# Patient Record
Sex: Male | Born: 1950
Health system: Southern US, Community
[De-identification: ages and names within clinical notes are randomized; demographics above are authoritative.]

## PROBLEM LIST (undated history)

## (undated) ENCOUNTER — Emergency Department (HOSPITAL_COMMUNITY): Payer: Medicare Other | Source: Home / Self Care

## (undated) DIAGNOSIS — E042 Nontoxic multinodular goiter: Secondary | ICD-10-CM

## (undated) DIAGNOSIS — G4700 Insomnia, unspecified: Secondary | ICD-10-CM

## (undated) DIAGNOSIS — Z87442 Personal history of urinary calculi: Secondary | ICD-10-CM

## (undated) DIAGNOSIS — R413 Other amnesia: Secondary | ICD-10-CM

## (undated) DIAGNOSIS — T7840XA Allergy, unspecified, initial encounter: Secondary | ICD-10-CM

## (undated) DIAGNOSIS — C801 Malignant (primary) neoplasm, unspecified: Secondary | ICD-10-CM

## (undated) DIAGNOSIS — Z860100 Personal history of colon polyps, unspecified: Secondary | ICD-10-CM

## (undated) DIAGNOSIS — E039 Hypothyroidism, unspecified: Secondary | ICD-10-CM

## (undated) DIAGNOSIS — D649 Anemia, unspecified: Secondary | ICD-10-CM

## (undated) DIAGNOSIS — F32A Depression, unspecified: Secondary | ICD-10-CM

## (undated) DIAGNOSIS — F329 Major depressive disorder, single episode, unspecified: Secondary | ICD-10-CM

## (undated) DIAGNOSIS — F419 Anxiety disorder, unspecified: Secondary | ICD-10-CM

## (undated) HISTORY — DX: Anxiety disorder, unspecified: F41.9

## (undated) HISTORY — PX: TIBIA FRACTURE SURGERY: SHX806

## (undated) HISTORY — DX: Anemia, unspecified: D64.9

## (undated) HISTORY — DX: Insomnia, unspecified: G47.00

## (undated) HISTORY — DX: Major depressive disorder, single episode, unspecified: F32.9

## (undated) HISTORY — PX: COLONOSCOPY: SHX174

## (undated) HISTORY — DX: Other amnesia: R41.3

## (undated) HISTORY — DX: Allergy, unspecified, initial encounter: T78.40XA

## (undated) HISTORY — DX: Nontoxic multinodular goiter: E04.2

## (undated) HISTORY — DX: Malignant (primary) neoplasm, unspecified: C80.1

## (undated) HISTORY — DX: Depression, unspecified: F32.A

## (undated) HISTORY — PX: OTHER SURGICAL HISTORY: SHX169

## (undated) HISTORY — DX: Personal history of colon polyps, unspecified: Z86.0100

## (undated) HISTORY — PX: COLONOSCOPY: SHX5424

## (undated) HISTORY — DX: Personal history of colonic polyps: Z86.010

## (undated) HISTORY — DX: Hypothyroidism, unspecified: E03.9

## (undated) HISTORY — PX: THYROIDECTOMY, PARTIAL: SHX18

## (undated) HISTORY — PX: POLYPECTOMY: SHX149

---

## 2000-07-01 ENCOUNTER — Encounter: Admission: RE | Admit: 2000-07-01 | Discharge: 2000-07-01 | Payer: Self-pay | Admitting: Sports Medicine

## 2000-10-31 ENCOUNTER — Encounter: Admission: RE | Admit: 2000-10-31 | Discharge: 2000-10-31 | Payer: Self-pay | Admitting: Family Medicine

## 2000-11-16 ENCOUNTER — Ambulatory Visit (HOSPITAL_COMMUNITY): Admission: RE | Admit: 2000-11-16 | Discharge: 2000-11-16 | Payer: Self-pay | Admitting: Endocrinology

## 2000-11-16 ENCOUNTER — Encounter: Payer: Self-pay | Admitting: Endocrinology

## 2000-12-26 ENCOUNTER — Encounter (INDEPENDENT_AMBULATORY_CARE_PROVIDER_SITE_OTHER): Payer: Self-pay | Admitting: *Deleted

## 2000-12-26 ENCOUNTER — Ambulatory Visit (HOSPITAL_COMMUNITY): Admission: RE | Admit: 2000-12-26 | Discharge: 2000-12-26 | Payer: Self-pay | Admitting: Internal Medicine

## 2001-03-31 ENCOUNTER — Encounter: Admission: RE | Admit: 2001-03-31 | Discharge: 2001-03-31 | Payer: Self-pay | Admitting: Sports Medicine

## 2001-04-11 ENCOUNTER — Encounter: Payer: Self-pay | Admitting: Sports Medicine

## 2001-04-11 ENCOUNTER — Ambulatory Visit (HOSPITAL_COMMUNITY): Admission: RE | Admit: 2001-04-11 | Discharge: 2001-04-11 | Payer: Self-pay | Admitting: Sports Medicine

## 2001-04-11 ENCOUNTER — Encounter: Admission: RE | Admit: 2001-04-11 | Discharge: 2001-04-11 | Payer: Self-pay | Admitting: Family Medicine

## 2001-04-25 ENCOUNTER — Ambulatory Visit (HOSPITAL_COMMUNITY): Admission: RE | Admit: 2001-04-25 | Discharge: 2001-04-25 | Payer: Self-pay | Admitting: Sports Medicine

## 2001-04-25 ENCOUNTER — Encounter: Admission: RE | Admit: 2001-04-25 | Discharge: 2001-04-25 | Payer: Self-pay | Admitting: Sports Medicine

## 2001-04-25 ENCOUNTER — Encounter: Payer: Self-pay | Admitting: Sports Medicine

## 2001-05-12 ENCOUNTER — Ambulatory Visit (HOSPITAL_COMMUNITY): Admission: RE | Admit: 2001-05-12 | Discharge: 2001-05-12 | Payer: Self-pay | Admitting: Sports Medicine

## 2001-05-12 ENCOUNTER — Encounter: Payer: Self-pay | Admitting: Sports Medicine

## 2001-06-22 ENCOUNTER — Ambulatory Visit (HOSPITAL_COMMUNITY): Admission: RE | Admit: 2001-06-22 | Discharge: 2001-06-22 | Payer: Self-pay | Admitting: Sports Medicine

## 2001-06-22 ENCOUNTER — Encounter: Payer: Self-pay | Admitting: Sports Medicine

## 2001-06-30 ENCOUNTER — Encounter: Admission: RE | Admit: 2001-06-30 | Discharge: 2001-06-30 | Payer: Self-pay | Admitting: Sports Medicine

## 2004-03-06 ENCOUNTER — Ambulatory Visit: Payer: Self-pay | Admitting: Internal Medicine

## 2004-04-22 ENCOUNTER — Ambulatory Visit: Payer: Self-pay | Admitting: Internal Medicine

## 2004-06-26 ENCOUNTER — Ambulatory Visit: Payer: Self-pay | Admitting: Internal Medicine

## 2004-07-28 ENCOUNTER — Ambulatory Visit: Payer: Self-pay | Admitting: Internal Medicine

## 2005-01-15 ENCOUNTER — Ambulatory Visit: Payer: Self-pay | Admitting: Internal Medicine

## 2005-01-19 ENCOUNTER — Ambulatory Visit: Payer: Self-pay | Admitting: Internal Medicine

## 2005-02-19 ENCOUNTER — Ambulatory Visit: Payer: Self-pay | Admitting: Sports Medicine

## 2005-10-01 ENCOUNTER — Ambulatory Visit: Payer: Self-pay | Admitting: Family Medicine

## 2005-12-10 ENCOUNTER — Ambulatory Visit: Payer: Self-pay | Admitting: Internal Medicine

## 2006-02-07 ENCOUNTER — Ambulatory Visit: Payer: Self-pay | Admitting: Internal Medicine

## 2006-02-07 LAB — CONVERTED CEMR LAB
ALT: 25 units/L (ref 0–40)
AST: 42 units/L — ABNORMAL HIGH (ref 0–37)
Albumin: 3.9 g/dL (ref 3.5–5.2)
Alkaline Phosphatase: 58 units/L (ref 39–117)
Bilirubin Urine: NEGATIVE
Chloride: 104 meq/L (ref 96–112)
Glomerular Filtration Rate, Af Am: 99 mL/min/{1.73_m2}
Glucose, Bld: 102 mg/dL — ABNORMAL HIGH (ref 70–99)
Leukocytes, UA: NEGATIVE
Potassium: 4.1 meq/L (ref 3.5–5.1)
Sodium: 142 meq/L (ref 135–145)
Specific Gravity, Urine: 1.02 (ref 1.000–1.03)
Total Bilirubin: 0.9 mg/dL (ref 0.3–1.2)
Total Protein, Urine: NEGATIVE mg/dL
Total Protein: 7.4 g/dL (ref 6.0–8.3)
Triglyceride fasting, serum: 26 mg/dL (ref 0–149)
Urobilinogen, UA: 0.2 (ref 0.0–1.0)
pH: 6.5 (ref 5.0–8.0)

## 2006-02-08 ENCOUNTER — Ambulatory Visit: Payer: Self-pay | Admitting: Internal Medicine

## 2006-03-08 DIAGNOSIS — E039 Hypothyroidism, unspecified: Secondary | ICD-10-CM

## 2006-03-08 HISTORY — DX: Hypothyroidism, unspecified: E03.9

## 2006-05-03 ENCOUNTER — Ambulatory Visit: Payer: Self-pay | Admitting: Internal Medicine

## 2006-08-05 ENCOUNTER — Ambulatory Visit: Payer: Self-pay | Admitting: Internal Medicine

## 2006-08-05 LAB — CONVERTED CEMR LAB: Vit D, 1,25-Dihydroxy: 139 — ABNORMAL HIGH (ref 20–57)

## 2006-08-18 ENCOUNTER — Ambulatory Visit: Payer: Self-pay | Admitting: Professional

## 2006-08-29 ENCOUNTER — Ambulatory Visit: Payer: Self-pay | Admitting: Professional

## 2006-09-15 ENCOUNTER — Ambulatory Visit: Payer: Self-pay | Admitting: Professional

## 2006-09-26 ENCOUNTER — Ambulatory Visit: Payer: Self-pay | Admitting: Professional

## 2006-11-09 ENCOUNTER — Ambulatory Visit: Payer: Self-pay | Admitting: Internal Medicine

## 2006-12-02 ENCOUNTER — Ambulatory Visit: Payer: Self-pay | Admitting: Internal Medicine

## 2006-12-02 LAB — CONVERTED CEMR LAB
ALT: 37 units/L (ref 0–53)
AST: 49 units/L — ABNORMAL HIGH (ref 0–37)
Basophils Relative: 1.3 % — ABNORMAL HIGH (ref 0.0–1.0)
Bilirubin, Direct: 0.1 mg/dL (ref 0.0–0.3)
CO2: 33 meq/L — ABNORMAL HIGH (ref 19–32)
Calcium: 9.4 mg/dL (ref 8.4–10.5)
Chloride: 106 meq/L (ref 96–112)
Eosinophils Absolute: 0.1 10*3/uL (ref 0.0–0.6)
Eosinophils Relative: 4 % (ref 0.0–5.0)
GFR calc Af Amer: 112 mL/min
GFR calc non Af Amer: 93 mL/min
Glucose, Bld: 99 mg/dL (ref 70–99)
HCT: 37.2 % — ABNORMAL LOW (ref 39.0–52.0)
Hemoglobin: 13.2 g/dL (ref 13.0–17.0)
Lymphocytes Relative: 30.9 % (ref 12.0–46.0)
MCV: 97.7 fL (ref 78.0–100.0)
Neutro Abs: 1.9 10*3/uL (ref 1.4–7.7)
Neutrophils Relative %: 54 % (ref 43.0–77.0)
Platelets: 233 10*3/uL (ref 150–400)
Sodium: 142 meq/L (ref 135–145)
Total Protein: 7.1 g/dL (ref 6.0–8.3)
Vitamin B-12: 380 pg/mL (ref 211–911)
WBC: 3.3 10*3/uL — ABNORMAL LOW (ref 4.5–10.5)

## 2006-12-12 ENCOUNTER — Encounter: Payer: Self-pay | Admitting: Internal Medicine

## 2006-12-28 ENCOUNTER — Emergency Department (HOSPITAL_COMMUNITY): Admission: EM | Admit: 2006-12-28 | Discharge: 2006-12-28 | Payer: Self-pay | Admitting: Emergency Medicine

## 2007-02-09 ENCOUNTER — Telehealth (INDEPENDENT_AMBULATORY_CARE_PROVIDER_SITE_OTHER): Payer: Self-pay | Admitting: *Deleted

## 2007-02-28 ENCOUNTER — Ambulatory Visit: Payer: Self-pay | Admitting: Internal Medicine

## 2007-02-28 DIAGNOSIS — E038 Other specified hypothyroidism: Secondary | ICD-10-CM

## 2007-03-01 ENCOUNTER — Ambulatory Visit: Payer: Self-pay | Admitting: Internal Medicine

## 2007-03-01 DIAGNOSIS — F329 Major depressive disorder, single episode, unspecified: Secondary | ICD-10-CM

## 2007-03-01 DIAGNOSIS — F3289 Other specified depressive episodes: Secondary | ICD-10-CM | POA: Insufficient documentation

## 2007-03-01 DIAGNOSIS — E039 Hypothyroidism, unspecified: Secondary | ICD-10-CM | POA: Insufficient documentation

## 2007-03-01 DIAGNOSIS — F338 Other recurrent depressive disorders: Secondary | ICD-10-CM | POA: Insufficient documentation

## 2007-03-01 DIAGNOSIS — E041 Nontoxic single thyroid nodule: Secondary | ICD-10-CM | POA: Insufficient documentation

## 2007-03-16 ENCOUNTER — Encounter: Admission: RE | Admit: 2007-03-16 | Discharge: 2007-03-16 | Payer: Self-pay | Admitting: Internal Medicine

## 2007-03-20 ENCOUNTER — Encounter: Payer: Self-pay | Admitting: Internal Medicine

## 2007-03-21 ENCOUNTER — Telehealth: Payer: Self-pay | Admitting: Internal Medicine

## 2007-04-28 ENCOUNTER — Ambulatory Visit: Payer: Self-pay | Admitting: Endocrinology

## 2007-05-15 ENCOUNTER — Encounter: Payer: Self-pay | Admitting: Internal Medicine

## 2007-05-23 ENCOUNTER — Encounter (INDEPENDENT_AMBULATORY_CARE_PROVIDER_SITE_OTHER): Payer: Self-pay | Admitting: Diagnostic Radiology

## 2007-05-23 ENCOUNTER — Encounter: Admission: RE | Admit: 2007-05-23 | Discharge: 2007-05-23 | Payer: Self-pay | Admitting: Endocrinology

## 2007-05-23 ENCOUNTER — Encounter: Payer: Self-pay | Admitting: Endocrinology

## 2007-05-23 ENCOUNTER — Other Ambulatory Visit: Admission: RE | Admit: 2007-05-23 | Discharge: 2007-05-23 | Payer: Self-pay | Admitting: Diagnostic Radiology

## 2007-06-27 ENCOUNTER — Ambulatory Visit: Payer: Self-pay | Admitting: Internal Medicine

## 2007-06-27 DIAGNOSIS — N529 Male erectile dysfunction, unspecified: Secondary | ICD-10-CM | POA: Insufficient documentation

## 2007-06-27 DIAGNOSIS — Z860100 Personal history of colon polyps, unspecified: Secondary | ICD-10-CM | POA: Insufficient documentation

## 2007-06-27 DIAGNOSIS — R609 Edema, unspecified: Secondary | ICD-10-CM

## 2007-06-28 LAB — CONVERTED CEMR LAB
BUN: 18 mg/dL (ref 6–23)
CO2: 32 meq/L (ref 19–32)
Calcium: 9.5 mg/dL (ref 8.4–10.5)
Chloride: 99 meq/L (ref 96–112)
Creatinine, Ser: 0.8 mg/dL (ref 0.4–1.5)

## 2007-07-06 ENCOUNTER — Ambulatory Visit: Payer: Self-pay | Admitting: Internal Medicine

## 2007-07-20 ENCOUNTER — Ambulatory Visit: Payer: Self-pay | Admitting: Internal Medicine

## 2007-07-27 ENCOUNTER — Telehealth: Payer: Self-pay | Admitting: Internal Medicine

## 2007-11-14 ENCOUNTER — Telehealth (INDEPENDENT_AMBULATORY_CARE_PROVIDER_SITE_OTHER): Payer: Self-pay | Admitting: *Deleted

## 2007-12-20 ENCOUNTER — Ambulatory Visit: Payer: Self-pay | Admitting: Internal Medicine

## 2007-12-20 ENCOUNTER — Encounter: Admission: RE | Admit: 2007-12-20 | Discharge: 2007-12-20 | Payer: Self-pay | Admitting: Internal Medicine

## 2007-12-21 LAB — CONVERTED CEMR LAB
Basophils Absolute: 0.1 10*3/uL (ref 0.0–0.1)
Bilirubin, Direct: 0.1 mg/dL (ref 0.0–0.3)
Calcium: 9.4 mg/dL (ref 8.4–10.5)
Eosinophils Absolute: 0.2 10*3/uL (ref 0.0–0.7)
GFR calc Af Amer: 149 mL/min
GFR calc non Af Amer: 124 mL/min
Glucose, Bld: 92 mg/dL (ref 70–99)
HCT: 41.5 % (ref 39.0–52.0)
Hemoglobin: 14.5 g/dL (ref 13.0–17.0)
MCHC: 34.9 g/dL (ref 30.0–36.0)
MCV: 97.8 fL (ref 78.0–100.0)
Monocytes Absolute: 0.4 10*3/uL (ref 0.1–1.0)
Neutro Abs: 1.8 10*3/uL (ref 1.4–7.7)
Platelets: 194 10*3/uL (ref 150–400)
Potassium: 3.8 meq/L (ref 3.5–5.1)
RDW: 12.4 % (ref 11.5–14.6)
Sodium: 140 meq/L (ref 135–145)
TSH: 3.11 microintl units/mL (ref 0.35–5.50)
Total Bilirubin: 0.9 mg/dL (ref 0.3–1.2)
Total Protein: 7 g/dL (ref 6.0–8.3)

## 2008-01-16 ENCOUNTER — Ambulatory Visit: Payer: Self-pay | Admitting: Internal Medicine

## 2008-07-01 ENCOUNTER — Encounter (INDEPENDENT_AMBULATORY_CARE_PROVIDER_SITE_OTHER): Payer: Self-pay | Admitting: *Deleted

## 2008-07-16 ENCOUNTER — Ambulatory Visit: Payer: Self-pay | Admitting: Internal Medicine

## 2008-07-16 DIAGNOSIS — G4700 Insomnia, unspecified: Secondary | ICD-10-CM | POA: Insufficient documentation

## 2008-07-18 LAB — CONVERTED CEMR LAB
AST: 34 units/L (ref 0–37)
Albumin: 3.8 g/dL (ref 3.5–5.2)
Alkaline Phosphatase: 74 units/L (ref 39–117)
Basophils Relative: 2 % (ref 0.0–3.0)
CO2: 33 meq/L — ABNORMAL HIGH (ref 19–32)
Calcium: 9.6 mg/dL (ref 8.4–10.5)
GFR calc non Af Amer: 105.33 mL/min (ref >60)
HCT: 42.5 % (ref 39.0–52.0)
Hemoglobin: 14.9 g/dL (ref 13.0–17.0)
Lymphocytes Relative: 37.4 % (ref 12.0–46.0)
MCHC: 34.9 g/dL (ref 30.0–36.0)
Monocytes Relative: 8.7 % (ref 3.0–12.0)
Neutro Abs: 1.8 10*3/uL (ref 1.4–7.7)
Potassium: 4.2 meq/L (ref 3.5–5.1)
RBC: 4.32 M/uL (ref 4.22–5.81)
Sodium: 141 meq/L (ref 135–145)
Total Protein: 7.6 g/dL (ref 6.0–8.3)

## 2008-08-01 ENCOUNTER — Telehealth: Payer: Self-pay | Admitting: Internal Medicine

## 2009-01-10 ENCOUNTER — Telehealth: Payer: Self-pay | Admitting: Internal Medicine

## 2009-01-13 ENCOUNTER — Ambulatory Visit: Payer: Self-pay | Admitting: Internal Medicine

## 2009-01-14 LAB — CONVERTED CEMR LAB
AST: 29 units/L (ref 0–37)
Albumin: 3.6 g/dL (ref 3.5–5.2)
Alkaline Phosphatase: 73 units/L (ref 39–117)
BUN: 15 mg/dL (ref 6–23)
Basophils Absolute: 0.1 10*3/uL (ref 0.0–0.1)
Basophils Relative: 1.7 % (ref 0.0–3.0)
CO2: 30 meq/L (ref 19–32)
Chloride: 104 meq/L (ref 96–112)
Creatinine, Ser: 0.8 mg/dL (ref 0.4–1.5)
Glucose, Bld: 96 mg/dL (ref 70–99)
Hemoglobin, Urine: NEGATIVE
Hemoglobin: 12.5 g/dL — ABNORMAL LOW (ref 13.0–17.0)
LDL Cholesterol: 116 mg/dL — ABNORMAL HIGH (ref 0–99)
Lymphocytes Relative: 31 % (ref 12.0–46.0)
Monocytes Relative: 12.4 % — ABNORMAL HIGH (ref 3.0–12.0)
Neutro Abs: 1.5 10*3/uL (ref 1.4–7.7)
RBC: 3.72 M/uL — ABNORMAL LOW (ref 4.22–5.81)
RDW: 11.8 % (ref 11.5–14.6)
Total CHOL/HDL Ratio: 3
Total Protein, Urine: NEGATIVE mg/dL
Urine Glucose: NEGATIVE mg/dL
pH: 5 (ref 5.0–8.0)

## 2009-01-15 ENCOUNTER — Ambulatory Visit: Payer: Self-pay | Admitting: Internal Medicine

## 2009-01-16 ENCOUNTER — Telehealth (INDEPENDENT_AMBULATORY_CARE_PROVIDER_SITE_OTHER): Payer: Self-pay | Admitting: *Deleted

## 2009-02-04 ENCOUNTER — Ambulatory Visit: Payer: Self-pay | Admitting: Endocrinology

## 2009-02-20 ENCOUNTER — Encounter: Admission: RE | Admit: 2009-02-20 | Discharge: 2009-02-20 | Payer: Self-pay | Admitting: Endocrinology

## 2009-05-01 ENCOUNTER — Telehealth: Payer: Self-pay | Admitting: Internal Medicine

## 2009-06-04 ENCOUNTER — Telehealth (INDEPENDENT_AMBULATORY_CARE_PROVIDER_SITE_OTHER): Payer: Self-pay | Admitting: *Deleted

## 2009-07-16 ENCOUNTER — Ambulatory Visit: Payer: Self-pay | Admitting: Internal Medicine

## 2009-07-22 ENCOUNTER — Encounter (INDEPENDENT_AMBULATORY_CARE_PROVIDER_SITE_OTHER): Payer: Self-pay | Admitting: *Deleted

## 2009-07-24 ENCOUNTER — Ambulatory Visit: Payer: Self-pay | Admitting: Internal Medicine

## 2009-07-31 ENCOUNTER — Ambulatory Visit: Payer: Self-pay | Admitting: Internal Medicine

## 2009-07-31 LAB — HM COLONOSCOPY

## 2010-01-15 ENCOUNTER — Ambulatory Visit: Payer: Self-pay | Admitting: Internal Medicine

## 2010-01-15 LAB — CONVERTED CEMR LAB
Albumin: 3.7 g/dL (ref 3.5–5.2)
Basophils Absolute: 0 10*3/uL (ref 0.0–0.1)
Basophils Relative: 1.1 % (ref 0.0–3.0)
Bilirubin Urine: NEGATIVE
CO2: 29 meq/L (ref 19–32)
Chloride: 105 meq/L (ref 96–112)
Eosinophils Absolute: 0.1 10*3/uL (ref 0.0–0.7)
Glucose, Bld: 95 mg/dL (ref 70–99)
HCT: 40 % (ref 39.0–52.0)
HDL: 74.3 mg/dL (ref >39.00)
Hemoglobin, Urine: NEGATIVE
Hemoglobin: 13.7 g/dL (ref 13.0–17.0)
Lymphs Abs: 1 10*3/uL (ref 0.7–4.0)
MCHC: 34.2 g/dL (ref 30.0–36.0)
MCV: 96.2 fL (ref 78.0–100.0)
Neutro Abs: 2.1 10*3/uL (ref 1.4–7.7)
Nitrite: NEGATIVE
PSA: 0.52 ng/mL (ref 0.10–4.00)
Potassium: 4.9 meq/L (ref 3.5–5.1)
RDW: 13.8 % (ref 11.5–14.6)
Sodium: 144 meq/L (ref 135–145)
TSH: 2.48 microintl units/mL (ref 0.35–5.50)
Total Protein, Urine: NEGATIVE mg/dL
Total Protein: 6.7 g/dL (ref 6.0–8.3)
Triglycerides: 42 mg/dL (ref 0.0–149.0)
Urine Glucose: NEGATIVE mg/dL
pH: 7.5 (ref 5.0–8.0)

## 2010-01-21 ENCOUNTER — Ambulatory Visit: Payer: Self-pay | Admitting: Internal Medicine

## 2010-01-21 ENCOUNTER — Encounter: Payer: Self-pay | Admitting: Internal Medicine

## 2010-04-07 NOTE — Assessment & Plan Note (Signed)
Summary: CPX /NWS  #   Vital Signs:  Patient profile:   60 year old male Height:      70 inches Weight:      150 pounds BMI:     21.60 Temp:     97.5 degrees F oral Pulse rate:   72 / minute Pulse rhythm:   regular Resp:     16 per minute BP sitting:   110 / 70  (left arm) Cuff size:   regular  Vitals Entered By: Lanier Prude, Beverly Gust) (January 21, 2010 3:17 PM) CC: CPX Is Patient Diabetic? No Comments pt is not taking Fluticasone, Loratadine or Vit D   CC:  CPX.  History of Present Illness: The patient presents for a preventive health examination C/o urinary dribbling. Achy at times.  Current Medications (verified): 1)  Wellbutrin Xl 300 Mg  Tb24 (Bupropion Hcl) .... Take 1 Tablet By Mouth Once A Day 2)  Aricept 10 Mg  Tabs (Donepezil Hcl) .... Take 1 Tablet By Mouth Once A Day 3)  Fish Oil 1000 Mg  Caps (Omega-3 Fatty Acids) .... Take 1 Tablet By Mouth Once A Day 4)  Levothyroxine Sodium 25 Mcg  Tabs (Levothyroxine Sodium) .... Take 1 Tablet By Mouth Once A Day 5)  Cialis 20 Mg Tabs (Tadalafil) .Marland Kitchen.. 1 Tablet Every Other Day As Needed 6)  Fluticasone Propionate 50 Mcg/act  Susp (Fluticasone Propionate) .... 2 Sprays Each Nostril Once Daily 7)  Loratadine 10 Mg  Tabs (Loratadine) .... Once Daily As Needed Allergies 8)  Vitamin D 1000 Unit Tabs (Cholecalciferol) .Marland Kitchen.. 1 By Mouth Qd 9)  Triamcinolone Acetonide 0.5 % Oint (Triamcinolone Acetonide) .... Use Two Times A Day As Needed On Rash  Allergies (verified): 1)  ! 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[employer]  Social History: Occupation: lost his job 2011 unemployed now Single--divorced x many years Never Smoked Regular exercise-yes very active triathlete Don[employer]  Review of Systems  The patient denies anorexia, fever, weight loss, weight gain, vision loss, decreased hearing, hoarseness, chest pain, syncope, dyspnea on exertion, peripheral edema, prolonged cough, headaches, hemoptysis, abdominal pain, melena, hematochezia, severe indigestion/heartburn, hematuria, incontinence, genital sores, muscle weakness, suspicious skin lesions, transient blindness, difficulty walking, depression, unusual weight change, abnormal bleeding, enlarged lymph nodes, angioedema, and testicular masses.    Physical Exam  General:  Well-developed,well-nourished,in no acute distress; alert,appropriate and cooperative throughout examination Head:  Normocephalic and atraumatic without obvious abnormalities. No apparent alopecia or balding. Nose:  External nasal examination shows no deformity or inflammation. Nasal mucosa are pink and moist without lesions or exudates. Mouth:  Oral mucosa and oropharynx without lesions or exudates.  Teeth in good repair. Neck:  Nodules w/o change Lungs:  Normal respiratory effort, chest expands symmetrically. Lungs are clear to auscultation, no crackles or wheezes. Heart:  Normal rate and regular rhythm. S1 and S2 normal without gallop, murmur, click, rub or other extra sounds. Abdomen:  Bowel sounds positive,abdomen soft and non-tender without masses, organomegaly or hernias noted. Prostate:  Prostate gland firm and smooth, no enlargement, nodularity,  tenderness, mass, asymmetry or induration. Msk:  No deformity or scoliosis noted of thoracic or lumbar spine.   Neurologic:  No cranial nerve deficits noted. Station and gait are normal. Plantar reflexes are  down-going bilaterally. DTRs are symmetrical throughout. Sensory, motor and coordinative functions appear intact. Skin:  Intact without suspicious lesions or rashes Psych:  Oriented X3, memory intact for recent and remote, normally interactive, good eye contact, not anxious appearing, not agitated, and not suicidal.     Impression & Recommendations:  Problem # 1:  PHYSICAL EXAMINATION (ICD-V70.0) Assessment New Health and age related issues were discussed. Available screening tests and vaccinations were discussed as well. Healthy life style including good diet and exercise was discussed.   Orders: EKG w/ Interpretation (93000) WNL The labs were reviewed with the patient.   Problem # 2:  DEPRESSION (ICD-311) Assessment: Improved  His updated medication list for this problem includes:    Wellbutrin Xl 300 Mg Tb24 (Bupropion hcl) .Marland Kitchen... Take 1 tablet by mouth once a day  Problem # 3:  THYROID NODULE (ICD-241.0) Assessment: Unchanged  Problem # 4:  MEMORY LOSS (ICD-780.93) Assessment: Unchanged  Complete Medication List: 1)  Wellbutrin Xl 300 Mg Tb24 (Bupropion hcl) .... Take 1 tablet by mouth once a day 2)  Aricept 10 Mg Tabs (Donepezil hcl) .... Take 1 tablet by mouth once a day 3)  Fish Oil 1000 Mg Caps (Omega-3 fatty acids) .... Take 1 tablet by mouth once a day 4)  Levothyroxine Sodium 25 Mcg Tabs (Levothyroxine sodium) .... Take 1 tablet by mouth once a day 5)  Cialis 20 Mg Tabs (Tadalafil) .Marland Kitchen.. 1 tablet every other day as needed 6)  Fluticasone Propionate 50 Mcg/act Susp (Fluticasone propionate) .... 2 sprays each nostril once daily 7)  Loratadine 10 Mg Tabs (Loratadine) .... Once daily as needed allergies 8)  Vitamin D 1000 Unit Tabs (Cholecalciferol) .Marland Kitchen.. 1 by mouth qd 9)  Triamcinolone Acetonide 0.5 % Oint (Triamcinolone acetonide) .... Use two times a day as needed on rash  Patient Instructions: 1)  Please schedule a follow-up appointment in 6  months. Prescriptions: LORATADINE 10 MG  TABS (LORATADINE) once daily as needed allergies  #30 x 6   Entered and Authorized by:   Tresa Garter MD   Signed by:   Tresa Garter MD on 01/21/2010   Method used:   Print then Give to Patient   RxID:   [id number] FLUTICASONE PROPIONATE 50 MCG/ACT  SUSP (FLUTICASONE PROPIONATE) 2 sprays each nostril once daily  #1 vial x 3   Entered and Authorized by:   Tresa Garter MD   Signed by:   Tresa Garter MD on 01/21/2010   Method used:   Print then Give to Patient   RxID:   [id number] CIALIS 20 MG TABS (TADALAFIL) 1 tablet every other day as needed  #12 Tablet x 11   Entered and Authorized by:   Tresa Garter MD   Signed by:   Tresa Garter MD on 01/21/2010   Method used:   Print then Give to Patient   RxID:   [id number] LEVOTHYROXINE SODIUM 25 MCG  TABS (LEVOTHYROXINE SODIUM) Take 1 tablet by mouth once a day  #30 Tablet x 11   Entered and Authorized by:   Tresa Garter MD   Signed by:   Tresa Garter MD on 01/21/2010   Method used:   Print then Give to Patient   RxID:   [id number] ARICEPT 10  MG  TABS (DONEPEZIL HCL) Take 1 tablet by mouth once a day  #30 Tablet x 11   Entered and Authorized by:   Tresa Garter MD   Signed by:   Tresa Garter MD on 01/21/2010   Method used:   Print then Give to Patient   RxID:   [id number] WELLBUTRIN XL 300 MG  TB24 (BUPROPION HCL) Take 1 tablet by mouth once a day  #30 Tablet x 6   Entered and Authorized by:   Tresa Garter MD   Signed by:   Tresa Garter MD on 01/21/2010   Method used:   Print then Give to Patient   RxID:   [id number]    Orders Added: 1)  EKG w/ Interpretation [93000] 2)  Est. Patient age 72-64 [phone]

## 2010-04-07 NOTE — Progress Notes (Signed)
Summary: Schedule Colonoscopy  Phone Note Outgoing Call Call back at Home Phone [phone]   Call placed by: Harlow Mares CMA Duncan Dull),  May 01, 2009 3:09 PM Call placed to: Patient Summary of Call: Left message on patients machine to call back. Patient needs colonoscopy recall Initial call taken by: Harlow Mares CMA Duncan Dull),  May 01, 2009 3:09 PM  Follow-up for Phone Call        Left message on patients machine to call back.  Follow-up by: Harlow Mares CMA Duncan Dull),  May 15, 2009 11:08 AM

## 2010-04-07 NOTE — Progress Notes (Signed)
  Phone Note Outgoing Call   Summary of Call: Message left for pt. to call me back as he is overdue for colon and Dr.Perry would like extra prep of clear liquids 48 hrs. prior and 1 bottle mag. citrate the day prior in the am then Movie-prep Initial call taken by: Teryl Lucy RN,  June 04, 2009 3:25 PM

## 2010-04-07 NOTE — Letter (Signed)
Summary: [facility] Instructions  [facility]  [address] [place], [place] [address]   Phone: [phone]  Fax: [phone]       MUJTABA BOLLIG    November 08, 1960    MRN: [id number]        Procedure Day Dorna Bloom:  Lenor Coffin  07/31/09     Arrival Time:  9:00am     Procedure Time:  10:00am     Location of Procedure:                    _ X_  [facility] (4th Floor)                        PREPARATION FOR COLONOSCOPY WITH MOVIPREP   Starting 5 days prior to your procedure  SATURDAY 05/21 do not eat nuts, seeds, popcorn, corn, beans, peas,  salads, or any raw vegetables.  Do not take any fiber supplements (e.g. Metamucil, Citrucel, and Benefiber).  48 HOURS BEFORE YOUR PROCEDURE         DATE:  TUESDAY 05/24  1.  Drink clear liquids the entire day-NO SOLID FOOD  2.  Do not drink anything colored red or purple.  Avoid juices with pulp.  No orange juice.  3.  Drink at least 64 oz. (8 glasses) of fluid/clear liquids during the day to prevent dehydration and help the prep work efficiently.  CLEAR LIQUIDS INCLUDE: Water Jello Ice Popsicles Tea (sugar ok, no milk/cream) Powdered fruit flavored drinks Coffee (sugar ok, no milk/cream) Gatorade Juice: apple, white grape, white cranberry  Lemonade Clear bullion, consomm, broth Carbonated beverages (any kind) Strained chicken noodle soup Hard Candy                             4.  In the morning, mix first dose of MoviPrep solution as directed . (See directions below) Then drink 1 bottle of Mag Citrate in am and then full Movi prep as directed ( at 5pm) per Dr Marina Goodell!    Empty 1 Pouch A and 1 Pouch B into the disposable container    Add lukewarm drinking water to the top line of the container. Mix to dissolve    Refrigerate (mixed solution should be used within 24 hrs)  5.  Begin drinking the prep at 5:00 p.m. The MoviPrep container is divided by 4 marks.   Every 15 minutes drink the solution down to the  next mark (approximately 8 oz) until the full liter is complete.   6.  Follow completed prep with 16 oz of clear liquid of your choice (Nothing red or purple).  Continue to drink clear liquids until bedtime.  7.  Before going to bed, mix second dose of MoviPrep solution:    Empty 1 Pouch A and 1 Pouch B into the disposable container    Add lukewarm drinking water to the top line of the container. Mix to dissolve    Refrigerate  THE DAY OF YOUR PROCEDURE      DATE: THURSDAY  05/26  Beginning at  5:00 a.m. (5 hours before procedure):         1. Every 15 minutes, drink the solution down to the next mark (approx 8 oz) until the full liter is complete.  2. Follow completed prep with 16 oz. of clear liquid of your choice.    3. You may drink clear liquids until  8:00am (2  HOURS BEFORE PROCEDURE).   MEDICATION INSTRUCTIONS  Unless otherwise instructed, you should take regular prescription medications with a small sip of water   as early as possible the morning of your procedure.  Diabetic patients - see separate instructions.  Stop taking Plavix or Aggrenox on  _  _  (7 days before procedure).     Stop taking Coumadin on  _ _  (5 days before procedure).  Additional medication instructions: _         OTHER INSTRUCTIONS  You will need a responsible adult at least 61 years of age to accompany you and drive you home.   This person must remain in the waiting room during your procedure.  Wear loose fitting clothing that is easily removed.  Leave jewelry and other valuables at home.  However, you may wish to bring a book to read or  an iPod/MP3 player to listen to music as you wait for your procedure to start.  Remove all body piercing jewelry and leave at home.  Total time from sign-in until discharge is approximately 2-3 hours.  You should go home directly after your procedure and rest.  You can resume normal activities the  day after your procedure.  The day of your  procedure you should not:   Drive   Make legal decisions   Operate machinery   Drink alcohol   Return to work  You will receive specific instructions about eating, activities and medications before you leave.    The above instructions have been reviewed and explained to me by   _______________________    I fully understand and can verbalize these instructions _____________________________ Date _________

## 2010-04-07 NOTE — Miscellaneous (Signed)
Summary: [facility] previsit  Clinical Lists Changes  Medications: Added new medication of MOVIPREP 100 GM  SOLR (PEG-KCL-NACL-NASULF-NA ASC-C) As per prep instructions. - Signed Rx of MOVIPREP 100 GM  SOLR (PEG-KCL-NACL-NASULF-NA ASC-C) As per prep instructions.;  #1 x 0;  Signed;  Entered by: Karl Bales RN;  Authorized by: Hilarie Fredrickson MD;  Method used: Electronically to [facility]. #[id number]*, [address], [place], [place], [place]  [address], Ph: [phone], Fax: [phone]    Prescriptions: MOVIPREP 100 GM  SOLR (PEG-KCL-NACL-NASULF-NA ASC-C) As per prep instructions.  #1 x 0   Entered by:   Karl Bales RN   Authorized by:   Hilarie Fredrickson MD   Signed by:   Karl Bales RN on 07/24/2009   Method used:   Electronically to        [facility]. [phone]* (retail)       [address]       [place], [place]  [address]       Ph: [phone]       Fax: [phone]   RxID:   [id number]

## 2010-04-07 NOTE — Assessment & Plan Note (Signed)
Summary: 6 mos f/u #?cd   Vital Signs:  Patient profile:   60 year old male Height:      70 inches Weight:      154.25 pounds BMI:     22.21 O2 Sat:      98 % on Room air Temp:     98.3 degrees F oral Pulse rate:   59 / minute BP sitting:   112 / 80  (left arm) Cuff size:   regular  Vitals Entered By: Lucious Groves (Jul 16, 2009 3:20 PM)  O2 Flow:  Room air CC: 6 mo f/u./kb Is Patient Diabetic? No Pain Assessment Patient in pain? no        CC:  6 mo f/u./kb.  History of Present Illness: The patient presents for a follow up of hypothyroidism, depression, ED, memory loss.   Current Medications (verified): 1)  Wellbutrin Xl 300 Mg  Tb24 (Bupropion Hcl) .... Take 1 Tablet By Mouth Once A Day 2)  Aricept 10 Mg  Tabs (Donepezil Hcl) .... Take 1 Tablet By Mouth Once A Day 3)  Fish Oil 1000 Mg  Caps (Omega-3 Fatty Acids) .... Take 1 Tablet By Mouth Once A Day 4)  Levothyroxine Sodium 25 Mcg  Tabs (Levothyroxine Sodium) .... Take 1 Tablet By Mouth Once A Day 5)  Cialis 20 Mg Tabs (Tadalafil) .Marland Kitchen.. 1 Tablet Every Other Day As Needed 6)  Fluticasone Propionate 50 Mcg/act  Susp (Fluticasone Propionate) .... 2 Sprays Each Nostril Once Daily 7)  Loratadine 10 Mg  Tabs (Loratadine) .... Once Daily As Needed Allergies  Allergies (verified): 1)  ! Penicillin V Potassium (Penicillin V Potassium)  Past History:  Past Medical History: Last updated: 06/27/2007 Allergic to bee stings Anxiety Depression Insomnia Hypothyroidism 2008 Memory problems Thyroid nodule 2009 Colonic polyps, hx of Dr Marina Goodell  Family History: Last updated: 04/28/2007 F CAD 70's no thyroid dz  Past Surgical History: Denies surgical history  Social History: Occupation: lost his job 2011 Single--divorced x many years Never Smoked Regular exercise-yes very active triathlete [employer]  Review of Systems  The patient denies fever, chest pain, prolonged cough, abdominal pain, melena, and hematochezia.     Physical Exam  General:  Well-developed,well-nourished,in no acute distress; alert,appropriate and cooperative throughout examination Ears:  External ear exam shows no significant lesions or deformities.  Otoscopic examination reveals clear canals, tympanic membranes are intact bilaterally without bulging, retraction, inflammation or discharge. Hearing is grossly normal bilaterally. Nose:  External nasal examination shows no deformity or inflammation. Nasal mucosa are pink and moist without lesions or exudates. Mouth:  Oral mucosa and oropharynx without lesions or exudates.  Teeth in good repair. Neck:  Nodules w/o change Lungs:  Normal respiratory effort, chest expands symmetrically. Lungs are clear to auscultation, no crackles or wheezes. Heart:  Normal rate and regular rhythm. S1 and S2 normal without gallop, murmur, click, rub or other extra sounds. Abdomen:  Bowel sounds positive,abdomen soft and non-tender without masses, organomegaly or hernias noted. Msk:  No deformity or scoliosis noted of thoracic or lumbar spine.   Pulses:  R and L carotid,radial,femoral,dorsalis pedis and posterior tibial pulses are full and equal bilaterally Extremities:  No clubbing, cyanosis, edema, or deformity noted with normal full range of motion of all joints.   Neurologic:  No cranial nerve deficits noted. Station and gait are normal. Plantar reflexes are down-going bilaterally. DTRs are symmetrical throughout. Sensory, motor and coordinative functions appear intact. Skin:  Intact without suspicious lesions or rashes Psych:  Oriented X3, memory intact for recent and remote, normally interactive, good eye contact, not anxious appearing, not agitated, and not suicidal.     Impression & Recommendations:  Problem # 1:  HYPOTHYROIDISM (ICD-244.9) Assessment Unchanged  His updated medication list for this problem includes:    Levothyroxine Sodium 25 Mcg Tabs (Levothyroxine sodium) .Marland Kitchen... Take 1 tablet by  mouth once a day  Problem # 2:  MEMORY LOSS (ICD-780.93) Assessment: Unchanged On prescription drug  therapy   Problem # 3:  DEPRESSION (ICD-311) Assessment: Unchanged  His updated medication list for this problem includes:    Wellbutrin Xl 300 Mg Tb24 (Bupropion hcl) .Marland Kitchen... Take 1 tablet by mouth once a day  Problem # 4:  ANXIETY (ICD-300.00) Assessment: Unchanged  His updated medication list for this problem includes:    Wellbutrin Xl 300 Mg Tb24 (Bupropion hcl) .Marland Kitchen... Take 1 tablet by mouth once a day  Complete Medication List: 1)  Wellbutrin Xl 300 Mg Tb24 (Bupropion hcl) .... Take 1 tablet by mouth once a day 2)  Aricept 10 Mg Tabs (Donepezil hcl) .... Take 1 tablet by mouth once a day 3)  Fish Oil 1000 Mg Caps (Omega-3 fatty acids) .... Take 1 tablet by mouth once a day 4)  Levothyroxine Sodium 25 Mcg Tabs (Levothyroxine sodium) .... Take 1 tablet by mouth once a day 5)  Cialis 20 Mg Tabs (Tadalafil) .Marland Kitchen.. 1 tablet every other day as needed 6)  Fluticasone Propionate 50 Mcg/act Susp (Fluticasone propionate) .... 2 sprays each nostril once daily 7)  Loratadine 10 Mg Tabs (Loratadine) .... Once daily as needed allergies 8)  Vitamin D 1000 Unit Tabs (Cholecalciferol) .Marland Kitchen.. 1 by mouth qd 9)  Triamcinolone Acetonide 0.5 % Oint (Triamcinolone acetonide) .... Use two times a day as needed on rash  Patient Instructions: 1)  Please schedule a follow-up appointment in 6 months well w/labs. 2)  Can try [place] Prescriptions: CIALIS 20 MG TABS (TADALAFIL) 1 tablet every other day as needed  #12 Tablet x 11   Entered and Authorized by:   Tresa Garter MD   Signed by:   Tresa Garter MD on 07/16/2009   Method used:   Print then Give to Patient   RxID:   [id number] TRIAMCINOLONE ACETONIDE 0.5 % OINT (TRIAMCINOLONE ACETONIDE) use two times a day as needed on rash  #45 g x 3   Entered and Authorized by:   Tresa Garter MD   Signed by:   Tresa Garter MD on  07/16/2009   Method used:   Print then Give to Patient   RxID:   [id number]

## 2010-04-07 NOTE — Procedures (Signed)
Summary: Colonoscopy  Patient: Travis Morse Note: All result statuses are Final unless otherwise noted.  Tests: (1) Colonoscopy (COL)   COL Colonoscopy           DONE     [facility]     [address]     [place], [place]  [address]           COLONOSCOPY PROCEDURE REPORT           PATIENT:  Travis Morse, Travis Morse  MR#:  [id number]     BIRTHDATE:  Jul 31, 1950, 58 yrs. old  GENDER:  male     ENDOSCOPIST:  Wilhemina Bonito. Eda Keys, MD     REF. BY:  Surveillance Program Recall,     PROCEDURE DATE:  07/31/2009     PROCEDURE:  Surveillance Colonoscopy     ASA CLASS:  Class II     INDICATIONS:  history of pre-cancerous (adenomatous) colon polyps     ; index 12-2000 w/ small adenoma; f/u 5-09 w/ poor prep     MEDICATIONS:   Fentanyl 100 mcg IV, Versed 10 mg IV, Benadryl 50     mg IV           DESCRIPTION OF PROCEDURE:   After the risks benefits and     alternatives of the procedure were thoroughly explained, informed     consent was obtained.  Digital rectal exam was performed and     revealed no abnormalities.   The LB [id number] endoscope     was introduced through the anus and advanced to the cecum, which     was identified by both the appendix and ileocecal valve, without     limitations.  The quality of the prep was fair despite 2 days of     clears, Mag citrate and  Moviprep. However, w/ vigorous irrigation     and time it was upgraded to good.Time to cecum = 6:25 min.  The     instrument was then slowly withdrawn (time = 16:47 min.) as the     colon was fully examined.     <<PROCEDUREIMAGES>>           FINDINGS:  A normal appearing cecum, ileocecal valve, and     appendiceal orifice were identified. The ascending, hepatic     flexure, transverse, splenic flexure, descending, sigmoid colon,     and rectum appeared unremarkable.  No polyps or cancers were seen.     Retroflexed views in the rectum revealed internal hemorrhoids.     The scope was then withdrawn from the  patient and the procedure     completed.           COMPLICATIONS:  None     ENDOSCOPIC IMPRESSION:     1) Normal colon     2) No polyps or cancers     3) Internal hemorrhoids     RECOMMENDATIONS:     1) Follow up colonoscopy in 5 years (again w/ extended prep)           ______________________________     Wilhemina Bonito. Eda Keys, MD           CC:  Linda Hedges. Plotnikov, MD;The Patient           n.     eSIGNED:   John N. Eda Keys at 07/31/2009 12:05 PM           Roderic Ovens, [id number]  Note: An exclamation mark (!) indicates a  result that was not dispersed into the flowsheet. Document Creation Date: 07/31/2009 12:06 PM _______________________________________________________________________  (1) Order result status: Final Collection or observation date-time: 07/31/2009 11:52 Requested date-time:  Receipt date-time:  Reported date-time:  Referring Physician:   Ordering Physician: Fransico Setters [phone]) Specimen Source:  Source: Launa Grill Order Number: [id number] Lab site:   Appended Document: Colonoscopy    Clinical Lists Changes  Observations: Added new observation of COLONNXTDUE: 07/2014 (07/31/2009 13:33)

## 2010-07-08 ENCOUNTER — Other Ambulatory Visit: Payer: Self-pay | Admitting: Internal Medicine

## 2010-07-21 ENCOUNTER — Encounter: Payer: Self-pay | Admitting: Internal Medicine

## 2010-07-23 ENCOUNTER — Ambulatory Visit (INDEPENDENT_AMBULATORY_CARE_PROVIDER_SITE_OTHER): Payer: BC Managed Care – PPO | Admitting: Internal Medicine

## 2010-07-23 ENCOUNTER — Encounter: Payer: Self-pay | Admitting: Internal Medicine

## 2010-07-23 ENCOUNTER — Other Ambulatory Visit (INDEPENDENT_AMBULATORY_CARE_PROVIDER_SITE_OTHER): Payer: BC Managed Care – PPO

## 2010-07-23 ENCOUNTER — Telehealth: Payer: Self-pay | Admitting: Internal Medicine

## 2010-07-23 DIAGNOSIS — F329 Major depressive disorder, single episode, unspecified: Secondary | ICD-10-CM

## 2010-07-23 DIAGNOSIS — R413 Other amnesia: Secondary | ICD-10-CM

## 2010-07-23 DIAGNOSIS — F3289 Other specified depressive episodes: Secondary | ICD-10-CM

## 2010-07-23 DIAGNOSIS — N529 Male erectile dysfunction, unspecified: Secondary | ICD-10-CM

## 2010-07-23 LAB — COMPREHENSIVE METABOLIC PANEL
ALT: 23 U/L (ref 0–53)
AST: 35 U/L (ref 0–37)
Alkaline Phosphatase: 74 U/L (ref 39–117)
BUN: 13 mg/dL (ref 6–23)
Calcium: 9.6 mg/dL (ref 8.4–10.5)
Chloride: 104 mEq/L (ref 96–112)
Creatinine, Ser: 0.7 mg/dL (ref 0.4–1.5)

## 2010-07-23 LAB — TESTOSTERONE: Testosterone: 399.81 ng/dL (ref 350.00–890.00)

## 2010-07-23 LAB — TSH: TSH: 3.03 u[IU]/mL (ref 0.35–5.50)

## 2010-07-23 MED ORDER — TADALAFIL 5 MG PO TABS: 5.0000 mg | ORAL_TABLET | Freq: Every day | ORAL | Status: DC | PRN

## 2010-07-23 NOTE — Assessment & Plan Note (Signed)
We will start Cialis daily

## 2010-07-23 NOTE — Assessment & Plan Note (Signed)
On Rx 

## 2010-07-23 NOTE — Progress Notes (Signed)
  Subjective:    Patient ID: Travis Morse, male    DOB: 09-07-1950, 60 y.o.   MRN: [id number]  HPI   F/u memory issues, depression, anxiety, ED, hypothyroidism.  Review of Systems  Constitutional: Negative for chills.  HENT: Negative for congestion.   Respiratory: Negative for cough.   Genitourinary: Negative for testicular pain.  Musculoskeletal: Negative for back pain.  Neurological: Negative for syncope.  Psychiatric/Behavioral: Negative for confusion.       Objective:   Physical Exam  Constitutional: He is oriented to person, place, and time. He appears well-developed.  HENT:  Mouth/Throat: Oropharynx is clear and moist.  Eyes: Conjunctivae are normal. Pupils are equal, round, and reactive to light.  Neck: Normal range of motion. No JVD present. No thyromegaly present.  Cardiovascular: Normal rate, regular rhythm, normal heart sounds and intact distal pulses.  Exam reveals no gallop and no friction rub.   No murmur heard. Pulmonary/Chest: Effort normal and breath sounds normal. No respiratory distress. He has no wheezes. He has no rales. He exhibits no tenderness.  Abdominal: Soft. Bowel sounds are normal. He exhibits no distension and no mass. There is no tenderness. There is no rebound and no guarding.  Musculoskeletal: Normal range of motion. He exhibits no edema and no tenderness.  Lymphadenopathy:    He has no cervical adenopathy.  Neurological: He is alert and oriented to person, place, and time. He has normal reflexes. No cranial nerve deficit. He exhibits normal muscle tone. Coordination normal.  Skin: Skin is warm and dry. No rash noted.  Psychiatric: He has a normal mood and affect. His behavior is normal. Judgment and thought content normal.          Assessment & Plan:  DEPRESSION On Rx  ERECTILE DYSFUNCTION We will start Cialis daily  MEMORY LOSS On Rx

## 2010-07-23 NOTE — Telephone Encounter (Signed)
Stacey, please, inform patient that all labs are normal Thx 

## 2010-07-24 NOTE — Telephone Encounter (Signed)
Left detailed mess informing pt of below.  

## 2010-08-09 ENCOUNTER — Other Ambulatory Visit: Payer: Self-pay | Admitting: Internal Medicine

## 2010-11-08 ENCOUNTER — Other Ambulatory Visit: Payer: Self-pay | Admitting: Internal Medicine

## 2010-12-16 LAB — URINALYSIS, ROUTINE W REFLEX MICROSCOPIC
Bilirubin Urine: NEGATIVE
Glucose, UA: NEGATIVE
Specific Gravity, Urine: 1.027
Urobilinogen, UA: 0.2
pH: 7

## 2010-12-16 LAB — DIFFERENTIAL
Basophils Absolute: 0
Basophils Relative: 1
Monocytes Absolute: 0.3
Neutro Abs: 2.3

## 2010-12-16 LAB — POCT I-STAT CREATININE
Creatinine, Ser: 1.1
Operator id: 270651

## 2010-12-16 LAB — I-STAT 8, (EC8 V) (CONVERTED LAB)
Acid-Base Excess: 6 — ABNORMAL HIGH
Glucose, Bld: 108 — ABNORMAL HIGH
Potassium: 4.2
TCO2: 35
pCO2, Ven: 57 — ABNORMAL HIGH
pH, Ven: 7.374 — ABNORMAL HIGH

## 2010-12-16 LAB — CBC
Hemoglobin: 14
MCHC: 34.2
Platelets: 227
RDW: 13.6

## 2010-12-16 LAB — URINE MICROSCOPIC-ADD ON

## 2010-12-23 ENCOUNTER — Telehealth: Payer: Self-pay | Admitting: *Deleted

## 2010-12-23 DIAGNOSIS — Z0000 Encounter for general adult medical examination without abnormal findings: Secondary | ICD-10-CM

## 2010-12-23 DIAGNOSIS — Z0389 Encounter for observation for other suspected diseases and conditions ruled out: Secondary | ICD-10-CM

## 2010-12-23 NOTE — Telephone Encounter (Signed)
Message copied by Merrilyn Puma on Wed Dec 23, 2010  8:47 AM ------      Message from: Earl Lagos      Created: Fri Dec 18, 2010  1:26 PM      Regarding: labs       Please schedule labs for cpx 01/26/11. Thanks

## 2010-12-23 NOTE — Telephone Encounter (Signed)
Labs entered.

## 2011-01-20 ENCOUNTER — Telehealth: Payer: Self-pay | Admitting: *Deleted

## 2011-01-20 DIAGNOSIS — Z202 Contact with and (suspected) exposure to infections with a predominantly sexual mode of transmission: Secondary | ICD-10-CM

## 2011-01-20 NOTE — Telephone Encounter (Signed)
Pt states that he called concerning labs that he is having prior to his OV on Tuesday, 11.20.12 [which have been placed in Epic], wanting to add another lab to orders for Herpes as he is presently in a relationship w/a partner that is positive for Genital Herpes and wanted to check his status.  Patient was told by scheduling that "he had to have appointment prior to having the new lab requested", he was wanting to avoid "being stuck twice" [two lab visits].  Can an order be placed for Herpes lab to be done with other labs prior to 11.20.12 OV. Please advise.

## 2011-01-21 ENCOUNTER — Other Ambulatory Visit (INDEPENDENT_AMBULATORY_CARE_PROVIDER_SITE_OTHER): Payer: BC Managed Care – PPO

## 2011-01-21 ENCOUNTER — Other Ambulatory Visit: Payer: Self-pay | Admitting: Internal Medicine

## 2011-01-21 ENCOUNTER — Other Ambulatory Visit: Payer: BC Managed Care – PPO

## 2011-01-21 DIAGNOSIS — Z0000 Encounter for general adult medical examination without abnormal findings: Secondary | ICD-10-CM

## 2011-01-21 DIAGNOSIS — Z0389 Encounter for observation for other suspected diseases and conditions ruled out: Secondary | ICD-10-CM

## 2011-01-21 LAB — CBC WITH DIFFERENTIAL/PLATELET
Basophils Absolute: 0.1 10*3/uL (ref 0.0–0.1)
Eosinophils Absolute: 0.1 10*3/uL (ref 0.0–0.7)
HCT: 39.7 % (ref 39.0–52.0)
Hemoglobin: 13.3 g/dL (ref 13.0–17.0)
Lymphs Abs: 1.3 10*3/uL (ref 0.7–4.0)
MCHC: 33.5 g/dL (ref 30.0–36.0)
Neutro Abs: 2.4 10*3/uL (ref 1.4–7.7)
RDW: 15.4 % — ABNORMAL HIGH (ref 11.5–14.6)

## 2011-01-21 LAB — URINALYSIS, ROUTINE W REFLEX MICROSCOPIC
Bilirubin Urine: NEGATIVE
Ketones, ur: NEGATIVE
Leukocytes, UA: NEGATIVE
Specific Gravity, Urine: 1.02 (ref 1.000–1.030)
Urobilinogen, UA: 0.2 (ref 0.0–1.0)

## 2011-01-21 NOTE — Telephone Encounter (Signed)
OK to add Herpes simplex type 1 and 2 serology Thx

## 2011-01-22 LAB — HEPATIC FUNCTION PANEL
ALT: 32 U/L (ref 0–53)
AST: 46 U/L — ABNORMAL HIGH (ref 0–37)
Albumin: 3.8 g/dL (ref 3.5–5.2)
Total Bilirubin: 0.5 mg/dL (ref 0.3–1.2)

## 2011-01-22 LAB — BASIC METABOLIC PANEL
BUN: 17 mg/dL (ref 6–23)
Chloride: 105 mEq/L (ref 96–112)
Glucose, Bld: 90 mg/dL (ref 70–99)
Potassium: 4.8 mEq/L (ref 3.5–5.1)

## 2011-01-22 LAB — PSA: PSA: 0.52 ng/mL (ref 0.10–4.00)

## 2011-01-26 ENCOUNTER — Encounter: Payer: Self-pay | Admitting: Internal Medicine

## 2011-01-26 ENCOUNTER — Ambulatory Visit (INDEPENDENT_AMBULATORY_CARE_PROVIDER_SITE_OTHER): Payer: BC Managed Care – PPO | Admitting: Internal Medicine

## 2011-01-26 VITALS — BP 118/80 | HR 72 | Temp 97.6°F | Resp 16 | Ht 70.0 in | Wt 154.0 lb

## 2011-01-26 DIAGNOSIS — Z23 Encounter for immunization: Secondary | ICD-10-CM

## 2011-01-26 DIAGNOSIS — E039 Hypothyroidism, unspecified: Secondary | ICD-10-CM

## 2011-01-26 DIAGNOSIS — R413 Other amnesia: Secondary | ICD-10-CM

## 2011-01-26 DIAGNOSIS — Z0000 Encounter for general adult medical examination without abnormal findings: Secondary | ICD-10-CM | POA: Insufficient documentation

## 2011-01-26 DIAGNOSIS — Z136 Encounter for screening for cardiovascular disorders: Secondary | ICD-10-CM

## 2011-01-26 DIAGNOSIS — N529 Male erectile dysfunction, unspecified: Secondary | ICD-10-CM

## 2011-01-26 NOTE — Telephone Encounter (Signed)
Order  Done

## 2011-01-26 NOTE — Progress Notes (Signed)
  Subjective:    Patient ID: Travis Morse, male    DOB: 1950-05-03, 60 y.o.   MRN: [id number]  HPI  The patient is here for a wellness exam. The patient has been doing well overall without major physical or psychological issues going on lately.   Review of Systems  Constitutional: Negative for appetite change, fatigue and unexpected weight change.  HENT: Negative for nosebleeds, congestion, sore throat, sneezing, trouble swallowing and neck pain.   Eyes: Negative for itching and visual disturbance.  Respiratory: Negative for cough.   Cardiovascular: Negative for chest pain, palpitations and leg swelling.  Gastrointestinal: Negative for nausea, diarrhea, blood in stool and abdominal distention.  Genitourinary: Negative for frequency and hematuria.  Musculoskeletal: Negative for back pain, joint swelling and gait problem.  Skin: Negative for rash.  Neurological: Negative for dizziness, tremors, speech difficulty and weakness.  Psychiatric/Behavioral: Negative for sleep disturbance, dysphoric mood and agitation. The patient is not nervous/anxious.        Objective:   Physical Exam  Constitutional: He is oriented to person, place, and time. He appears well-developed and well-nourished. No distress.  HENT:  Head: Normocephalic and atraumatic.  Right Ear: External ear normal.  Left Ear: External ear normal.  Nose: Nose normal.  Mouth/Throat: Oropharynx is clear and moist. No oropharyngeal exudate.  Eyes: Conjunctivae and EOM are normal. Pupils are equal, round, and reactive to light. Right eye exhibits no discharge. Left eye exhibits no discharge. No scleral icterus.  Neck: Normal range of motion. Neck supple. No JVD present. No tracheal deviation present. No thyromegaly present.  Cardiovascular: Normal rate, regular rhythm, normal heart sounds and intact distal pulses.  Exam reveals no gallop and no friction rub.   No murmur heard. Pulmonary/Chest: Effort normal and breath sounds  normal. No stridor. No respiratory distress. He has no wheezes. He has no rales. He exhibits no tenderness.  Abdominal: Soft. Bowel sounds are normal. He exhibits no distension and no mass. There is no tenderness. There is no rebound and no guarding.  Genitourinary: Rectum normal, prostate normal and penis normal. Guaiac negative stool. No penile tenderness.  Musculoskeletal: Normal range of motion. He exhibits no edema and no tenderness.  Lymphadenopathy:    He has no cervical adenopathy.  Neurological: He is alert and oriented to person, place, and time. He has normal reflexes. No cranial nerve deficit. He exhibits normal muscle tone. Coordination normal.  Skin: Skin is warm and dry. No rash noted. He is not diaphoretic. No erythema. No pallor.  Psychiatric: He has a normal mood and affect. His behavior is normal. Judgment and thought content normal.     Lab Results  Component Value Date   WBC 4.3* 01/21/2011   HGB 13.3 01/21/2011   HCT 39.7 01/21/2011   PLT 239.0 01/21/2011   GLUCOSE 90 01/21/2011   CHOL 204* 01/21/2011   TRIG 33.0 01/21/2011   HDL 82.50 01/21/2011   LDLDIRECT 117.3 01/21/2011   LDLCALC 116* 01/13/2009   ALT 32 01/21/2011   AST 46* 01/21/2011   NA 142 01/21/2011   K 4.8 01/21/2011   CL 105 01/21/2011   CREATININE 0.8 01/21/2011   BUN 17 01/21/2011   CO2 29 01/21/2011   TSH 2.70 01/21/2011   PSA 0.52 01/21/2011        Assessment & Plan:

## 2011-01-26 NOTE — Assessment & Plan Note (Signed)
We discussed age appropriate health related issues, including available/recomended screening tests and vaccinations. We discussed a need for adhering to healthy diet and exercise. Labs/EKG were reviewed/ordered. All questions were answered.   

## 2011-01-26 NOTE — Assessment & Plan Note (Signed)
Continue with current prescription therapy as reflected on the Med list.  

## 2011-01-26 NOTE — Assessment & Plan Note (Signed)
Chronic, mild 

## 2011-01-26 NOTE — Assessment & Plan Note (Signed)
Use Cialis

## 2011-01-31 ENCOUNTER — Encounter: Payer: Self-pay | Admitting: Internal Medicine

## 2011-02-08 ENCOUNTER — Other Ambulatory Visit: Payer: Self-pay | Admitting: *Deleted

## 2011-02-08 MED ORDER — DONEPEZIL HCL 10 MG PO TABS: 10.0000 mg | ORAL_TABLET | Freq: Every day | ORAL | Status: DC

## 2011-05-07 ENCOUNTER — Other Ambulatory Visit: Payer: Self-pay | Admitting: Internal Medicine

## 2011-07-26 ENCOUNTER — Ambulatory Visit (INDEPENDENT_AMBULATORY_CARE_PROVIDER_SITE_OTHER): Payer: BC Managed Care – PPO | Admitting: Internal Medicine

## 2011-07-26 ENCOUNTER — Encounter: Payer: Self-pay | Admitting: Internal Medicine

## 2011-07-26 ENCOUNTER — Other Ambulatory Visit (INDEPENDENT_AMBULATORY_CARE_PROVIDER_SITE_OTHER): Payer: BC Managed Care – PPO

## 2011-07-26 VITALS — BP 120/80 | HR 68 | Temp 97.8°F | Resp 16 | Wt 154.0 lb

## 2011-07-26 DIAGNOSIS — R209 Unspecified disturbances of skin sensation: Secondary | ICD-10-CM

## 2011-07-26 DIAGNOSIS — E039 Hypothyroidism, unspecified: Secondary | ICD-10-CM

## 2011-07-26 DIAGNOSIS — R413 Other amnesia: Secondary | ICD-10-CM

## 2011-07-26 DIAGNOSIS — R202 Paresthesia of skin: Secondary | ICD-10-CM

## 2011-07-26 DIAGNOSIS — F329 Major depressive disorder, single episode, unspecified: Secondary | ICD-10-CM

## 2011-07-26 DIAGNOSIS — M2550 Pain in unspecified joint: Secondary | ICD-10-CM

## 2011-07-26 LAB — BASIC METABOLIC PANEL
BUN: 12 mg/dL (ref 6–23)
CO2: 29 mEq/L (ref 19–32)
GFR: 114.08 mL/min (ref >60.00)
Glucose, Bld: 98 mg/dL (ref 70–99)
Potassium: 4.7 mEq/L (ref 3.5–5.1)

## 2011-07-26 NOTE — Assessment & Plan Note (Signed)
Resolved

## 2011-07-26 NOTE — Assessment & Plan Note (Signed)
Better Continue with current prescription therapy as reflected on the Med list.  

## 2011-07-26 NOTE — Progress Notes (Signed)
Patient ID: Travis Morse, male   DOB: 1950-05-07, 61 y.o.   MRN: [id number]  Subjective:    Patient ID: Travis Morse, male    DOB: 11-21-1950, 62 y.o.   MRN: [id number]  Knee Pain     The patient is here to follow up on chronic depression, anxiety, hypothyroidism and a new chronic mild to moderate achyness and some stiffness symptoms x 6-10 mo. No joint swelling. Knees are worse than before.Marland KitchenMarland KitchenOne advil a day usually would eliminate all sx's.  Wt Readings from Last 3 Encounters:  07/26/11 154 lb (69.854 kg)  01/26/11 154 lb (69.854 kg)  07/23/10 154 lb (69.854 kg)   BP Readings from Last 3 Encounters:  07/26/11 120/80  01/26/11 118/80  07/23/10 100/78         Review of Systems  Constitutional: Negative for appetite change, fatigue and unexpected weight change.  HENT: Negative for nosebleeds, congestion, sore throat, sneezing, trouble swallowing and neck pain.   Eyes: Negative for itching and visual disturbance.  Respiratory: Negative for cough.   Cardiovascular: Negative for chest pain, palpitations and leg swelling.  Gastrointestinal: Negative for nausea, diarrhea, blood in stool and abdominal distention.  Genitourinary: Negative for frequency and hematuria.  Musculoskeletal: Negative for back pain, joint swelling and gait problem.  Skin: Negative for rash.  Neurological: Negative for dizziness, tremors, speech difficulty and weakness.  Psychiatric/Behavioral: Negative for sleep disturbance, dysphoric mood and agitation. The patient is not nervous/anxious.        Objective:   Physical Exam  Constitutional: He is oriented to person, place, and time. He appears well-developed and well-nourished. No distress.  HENT:  Head: Normocephalic and atraumatic.  Right Ear: External ear normal.  Left Ear: External ear normal.  Nose: Nose normal.  Mouth/Throat: Oropharynx is clear and moist. No oropharyngeal exudate.  Eyes: Conjunctivae and EOM are normal. Pupils are  equal, round, and reactive to light. Right eye exhibits no discharge. Left eye exhibits no discharge. No scleral icterus.  Neck: Normal range of motion. Neck supple. No JVD present. No tracheal deviation present. No thyromegaly present.  Cardiovascular: Normal rate, regular rhythm, normal heart sounds and intact distal pulses.  Exam reveals no gallop and no friction rub.   No murmur heard. Pulmonary/Chest: Effort normal and breath sounds normal. No stridor. No respiratory distress. He has no wheezes. He has no rales. He exhibits no tenderness.  Abdominal: Soft. Bowel sounds are normal. He exhibits no distension and no mass. There is no tenderness. There is no rebound and no guarding.  Genitourinary: Rectum normal, prostate normal and penis normal. Guaiac negative stool. No penile tenderness.  Musculoskeletal: Normal range of motion. He exhibits no edema and no tenderness.  Lymphadenopathy:    He has no cervical adenopathy.  Neurological: He is alert and oriented to person, place, and time. He has normal reflexes. No cranial nerve deficit. He exhibits normal muscle tone. Coordination normal.  Skin: Skin is warm and dry. No rash noted. He is not diaphoretic. No erythema. No pallor.  Psychiatric: He has a normal mood and affect. His behavior is normal. Judgment and thought content normal.     Lab Results  Component Value Date   WBC 4.3* 01/21/2011   HGB 13.3 01/21/2011   HCT 39.7 01/21/2011   PLT 239.0 01/21/2011   GLUCOSE 90 01/21/2011   CHOL 204* 01/21/2011   TRIG 33.0 01/21/2011   HDL 82.50 01/21/2011   LDLDIRECT 117.3 01/21/2011   LDLCALC 116* 01/13/2009   ALT  32 01/21/2011   AST 46* 01/21/2011   NA 142 01/21/2011   K 4.8 01/21/2011   CL 105 01/21/2011   CREATININE 0.8 01/21/2011   BUN 17 01/21/2011   CO2 29 01/21/2011   TSH 2.70 01/21/2011   PSA 0.52 01/21/2011        Assessment & Plan:

## 2011-07-26 NOTE — Assessment & Plan Note (Addendum)
Ibuprofen prn Labs

## 2011-07-26 NOTE — Assessment & Plan Note (Signed)
Continue with current prescription therapy as reflected on the Med list.  

## 2011-07-27 ENCOUNTER — Telehealth: Payer: Self-pay | Admitting: Internal Medicine

## 2011-07-27 LAB — RHEUMATOID FACTOR: Rhuematoid fact SerPl-aCnc: <10 IU/mL (ref <=14)

## 2011-07-27 MED ORDER — VITAMIN B-12 500 MCG SL SUBL: SUBLINGUAL_TABLET | SUBLINGUAL | Status: DC

## 2011-07-27 NOTE — Telephone Encounter (Signed)
Pt informed. Pt requests lab results. I advised him copies are upfront for p/u.

## 2011-07-27 NOTE — Telephone Encounter (Signed)
Travis Morse, please, inform patient that all labs are normal except for low nl Vit B12 - start SL vit B12 daily Thx

## 2011-07-30 ENCOUNTER — Other Ambulatory Visit: Payer: Self-pay

## 2011-07-30 MED ORDER — VITAMIN B-12 500 MCG SL SUBL: SUBLINGUAL_TABLET | SUBLINGUAL | Status: DC

## 2011-08-13 ENCOUNTER — Telehealth: Payer: Self-pay | Admitting: *Deleted

## 2011-08-13 NOTE — Telephone Encounter (Signed)
Pt walked into office today stating he was bitten by a tick yesterday. He is requesting abx for this. Please advise

## 2011-08-13 NOTE — Telephone Encounter (Signed)
Called pt- he thinks the ticks got on him on Monday 08-09-11. He found another one on 08-12-11. He denies any symptoms. I informed him no abx needed.

## 2011-08-13 NOTE — Telephone Encounter (Signed)
No need for an abx if no rash, fever etc RThx

## 2011-09-06 ENCOUNTER — Other Ambulatory Visit: Payer: Self-pay | Admitting: Internal Medicine

## 2011-09-09 ENCOUNTER — Other Ambulatory Visit: Payer: Self-pay | Admitting: Internal Medicine

## 2011-09-14 ENCOUNTER — Telehealth: Payer: Self-pay

## 2011-09-14 NOTE — Telephone Encounter (Signed)
OK to fill this prescription with additional refills Thank you!  

## 2011-09-14 NOTE — Telephone Encounter (Signed)
Pt called requesting hard copy Rx for Cialis 5 mg 1 po qd # 30 x 11 to take with him on a trip to [place]. Pt  Would like to purchase medication at a discounted price.

## 2011-09-15 MED ORDER — TADALAFIL 5 MG PO TABS: 5.0000 mg | ORAL_TABLET | Freq: Every day | ORAL | Status: DC | PRN

## 2011-09-15 NOTE — Telephone Encounter (Signed)
Rx printed/pending MD sig. 

## 2011-09-16 NOTE — Telephone Encounter (Signed)
Rx up front for pick up. Pt informed.

## 2011-10-25 ENCOUNTER — Other Ambulatory Visit: Payer: Self-pay | Admitting: Internal Medicine

## 2012-01-14 ENCOUNTER — Telehealth: Payer: Self-pay | Admitting: Internal Medicine

## 2012-01-14 DIAGNOSIS — E039 Hypothyroidism, unspecified: Secondary | ICD-10-CM

## 2012-01-14 DIAGNOSIS — R5383 Other fatigue: Secondary | ICD-10-CM

## 2012-01-14 NOTE — Telephone Encounter (Signed)
B12 TSH BMET Thx

## 2012-01-14 NOTE — Telephone Encounter (Signed)
Pt req lab order prior to his appt 01/24/12. Last OV was 07/26/11 and pt stated he always does his lab work prior to his appt. Please call pt if this is ok.

## 2012-01-17 NOTE — Telephone Encounter (Signed)
Labs entered. Pt informed  

## 2012-01-18 ENCOUNTER — Other Ambulatory Visit (INDEPENDENT_AMBULATORY_CARE_PROVIDER_SITE_OTHER): Payer: BC Managed Care – PPO

## 2012-01-18 DIAGNOSIS — E039 Hypothyroidism, unspecified: Secondary | ICD-10-CM

## 2012-01-18 DIAGNOSIS — R5381 Other malaise: Secondary | ICD-10-CM

## 2012-01-18 DIAGNOSIS — R5383 Other fatigue: Secondary | ICD-10-CM

## 2012-01-18 LAB — BASIC METABOLIC PANEL
CO2: 29 mEq/L (ref 19–32)
Calcium: 9.1 mg/dL (ref 8.4–10.5)
GFR: 113.9 mL/min (ref >60.00)
Sodium: 139 mEq/L (ref 135–145)

## 2012-01-18 LAB — VITAMIN B12: Vitamin B-12: 752 pg/mL (ref 211–911)

## 2012-01-18 LAB — TSH: TSH: 3.32 u[IU]/mL (ref 0.35–5.50)

## 2012-01-24 ENCOUNTER — Encounter: Payer: Self-pay | Admitting: Internal Medicine

## 2012-01-24 ENCOUNTER — Ambulatory Visit (INDEPENDENT_AMBULATORY_CARE_PROVIDER_SITE_OTHER): Payer: BC Managed Care – PPO | Admitting: Internal Medicine

## 2012-01-24 VITALS — BP 120/60 | HR 76 | Temp 97.4°F | Resp 16 | Wt 158.0 lb

## 2012-01-24 DIAGNOSIS — E041 Nontoxic single thyroid nodule: Secondary | ICD-10-CM

## 2012-01-24 DIAGNOSIS — N529 Male erectile dysfunction, unspecified: Secondary | ICD-10-CM

## 2012-01-24 DIAGNOSIS — Z2911 Encounter for prophylactic immunotherapy for respiratory syncytial virus (RSV): Secondary | ICD-10-CM

## 2012-01-24 DIAGNOSIS — R413 Other amnesia: Secondary | ICD-10-CM

## 2012-01-24 DIAGNOSIS — G4700 Insomnia, unspecified: Secondary | ICD-10-CM

## 2012-01-24 DIAGNOSIS — E039 Hypothyroidism, unspecified: Secondary | ICD-10-CM

## 2012-01-24 DIAGNOSIS — Z23 Encounter for immunization: Secondary | ICD-10-CM

## 2012-01-24 NOTE — Assessment & Plan Note (Signed)
-   doing well

## 2012-01-24 NOTE — Assessment & Plan Note (Signed)
Continue with current prescription therapy as reflected on the Med list.  

## 2012-01-24 NOTE — Assessment & Plan Note (Signed)
Doing well 

## 2012-01-24 NOTE — Progress Notes (Signed)
Subjective:    Patient ID: Travis Morse, male    DOB: Jan 15, 1951, 62 y.o.   MRN: [id number]  HPI  The patient is here to follow up on chronic depression, anxiety, hypothyroidism and a new chronic mild to moderate achyness and some stiffness symptoms x several months. No joint swelling. Knees are worse than before.Marland KitchenMarland KitchenOne advil a day usually would eliminate all sx's.   Wt Readings from Last 3 Encounters:  01/24/12 158 lb (71.668 kg)  07/26/11 154 lb (69.854 kg)  01/26/11 154 lb (69.854 kg)   BP Readings from Last 3 Encounters:  01/24/12 120/60  07/26/11 120/80  01/26/11 118/80    Review of Systems  Constitutional: Negative for appetite change, fatigue and unexpected weight change.  HENT: Negative for nosebleeds, congestion, sore throat, sneezing, trouble swallowing and neck pain.   Eyes: Negative for itching and visual disturbance.  Respiratory: Negative for cough.   Cardiovascular: Negative for chest pain, palpitations and leg swelling.  Gastrointestinal: Negative for nausea, diarrhea, blood in stool and abdominal distention.  Genitourinary: Negative for frequency and hematuria.  Musculoskeletal: Negative for back pain, joint swelling and gait problem.  Skin: Negative for rash.  Neurological: Negative for dizziness, tremors, speech difficulty and weakness.  Psychiatric/Behavioral: Negative for sleep disturbance, dysphoric mood and agitation. The patient is not nervous/anxious.        Objective:   Physical Exam  Constitutional: He is oriented to person, place, and time. He appears well-developed and well-nourished. No distress.  HENT:  Head: Normocephalic and atraumatic.  Right Ear: External ear normal.  Left Ear: External ear normal.  Nose: Nose normal.  Mouth/Throat: Oropharynx is clear and moist. No oropharyngeal exudate.  Eyes: Conjunctivae normal and EOM are normal. Pupils are equal, round, and reactive to light. Right eye exhibits no discharge. Left eye exhibits  no discharge. No scleral icterus.  Neck: Normal range of motion. Neck supple. No JVD present. No tracheal deviation present. No thyromegaly present.  Cardiovascular: Normal rate, regular rhythm, normal heart sounds and intact distal pulses.  Exam reveals no gallop and no friction rub.   No murmur heard. Pulmonary/Chest: Effort normal and breath sounds normal. No stridor. No respiratory distress. He has no wheezes. He has no rales. He exhibits no tenderness.  Abdominal: Soft. Bowel sounds are normal. He exhibits no distension and no mass. There is no tenderness. There is no rebound and no guarding.  Genitourinary: Rectum normal, prostate normal and penis normal. Guaiac negative stool. No penile tenderness.  Musculoskeletal: Normal range of motion. He exhibits no edema and no tenderness.  Lymphadenopathy:    He has no cervical adenopathy.  Neurological: He is alert and oriented to person, place, and time. He has normal reflexes. No cranial nerve deficit. He exhibits normal muscle tone. Coordination normal.  Skin: Skin is warm and dry. No rash noted. He is not diaphoretic. No erythema. No pallor.  Psychiatric: He has a normal mood and affect. His behavior is normal. Judgment and thought content normal.     Lab Results  Component Value Date   WBC 4.3* 01/21/2011   HGB 13.3 01/21/2011   HCT 39.7 01/21/2011   PLT 239.0 01/21/2011   GLUCOSE 98 01/18/2012   CHOL 204* 01/21/2011   TRIG 33.0 01/21/2011   HDL 82.50 01/21/2011   LDLDIRECT 117.3 01/21/2011   LDLCALC 116* 01/13/2009   ALT 32 01/21/2011   AST 46* 01/21/2011   NA 139 01/18/2012   K 4.7 01/18/2012   CL 102 01/18/2012  CREATININE 0.7 01/18/2012   BUN 13 01/18/2012   CO2 29 01/18/2012   TSH 3.32 01/18/2012   PSA 0.52 01/21/2011        Assessment & Plan:

## 2012-02-17 ENCOUNTER — Ambulatory Visit
Admission: RE | Admit: 2012-02-17 | Discharge: 2012-02-17 | Disposition: A | Discharge status: Home / Self Care | Payer: BC Managed Care – PPO | Source: Ambulatory Visit | Attending: Internal Medicine | Admitting: Internal Medicine

## 2012-03-22 ENCOUNTER — Other Ambulatory Visit: Payer: Self-pay | Admitting: Internal Medicine

## 2012-04-24 ENCOUNTER — Other Ambulatory Visit: Payer: Self-pay | Admitting: Internal Medicine

## 2012-05-31 ENCOUNTER — Other Ambulatory Visit: Payer: Self-pay | Admitting: Internal Medicine

## 2012-06-22 ENCOUNTER — Encounter: Payer: Self-pay | Admitting: Sports Medicine

## 2012-06-22 ENCOUNTER — Ambulatory Visit (INDEPENDENT_AMBULATORY_CARE_PROVIDER_SITE_OTHER): Payer: BC Managed Care – PPO | Admitting: Sports Medicine

## 2012-06-22 VITALS — BP 138/81 | HR 51 | Ht 70.0 in | Wt 155.0 lb

## 2012-06-22 DIAGNOSIS — M76822 Posterior tibial tendinitis, left leg: Secondary | ICD-10-CM | POA: Insufficient documentation

## 2012-06-22 DIAGNOSIS — M76829 Posterior tibial tendinitis, unspecified leg: Secondary | ICD-10-CM

## 2012-06-22 MED ORDER — NITROGLYCERIN 0.2 MG/HR TD PT24: MEDICATED_PATCH | TRANSDERMAL | Status: DC

## 2012-06-22 NOTE — Patient Instructions (Addendum)
Thank you for coming in today. Wear the sleeve and the insoles in your shoes.  Try the pigeon toe walk forward and backwards.  Also do the heel raise exercises with pigeon toes.  Come back in 6 weeks or so.   Remove the patch 1 hr prior to Cialis use.  Nitroglycerin Protocol   Apply 1/4 nitroglycerin patch to affected area daily.  Change position of patch within the affected area every 24 hours.  You may experience a headache during the first 1-2 weeks of using the patch, these should subside.  If you experience headaches after beginning nitroglycerin patch treatment, you may take your preferred over the counter pain reliever.  Another side effect of the nitroglycerin patch is skin irritation or rash related to patch adhesive.  Please notify our office if you develop more severe headaches or rash, and stop the patch.  Tendon healing with nitroglycerin patch may require 12 to 24 weeks depending on the extent of injury.  Men should not use if taking Viagra, Cialis, or Levitra.   Do not use if you have migraines or rosacea.

## 2012-06-22 NOTE — Progress Notes (Signed)
Travis Morse is a 62 y.o. male who presents to [facility] today for left medial ankle pain present for 10 months. Ankle pain occurred last summer while running on a treadmill. Patient notes persistent medial ankle pain worse with activity and better with rest. Patient notes that he does swing dancing and 8 miles of running per week. He is significant pain with this. Additionally he notes some swelling. He denies any radiating pain weakness numbness fevers or chills. Additionally denies any specific injury. He's tried some over-the-counter pain medications which have been marginally helpful.    PMH reviewed. ED History  Substance Use Topics  . Smoking status: Never Smoker   . Smokeless tobacco: Never Used  . Alcohol Use: No   ROS as above otherwise neg   Exam:  BP 138/81  Pulse 51  Ht 5\' 10"  (1.778 m)  Wt 155 lb (70.308 kg)  BMI 22.24 kg/m2 Gen: Well NAD MSK: Left ankle. Mildly swollen the medial aspect of the ankle. Significant pes planus with medial pronation and subluxation.  Mildly tender over the posterior tibialis course at the malleolus.  Mild hind foot valgus.  Normal posterior tibialis function with toe standing.  Normal motion and strength Pulses capillary refill and sensation are intact distal  Limited musculoskeletal ultrasound of the left ankle.  Posterior tibialis course noted from 3 cm proximal to the medial malleolus to insertion of navicular.  The posterior tibialis tendon sheath has hypoechoic fluid consistent with tenosynovitis.  Additionally appears to be an accessory tendon interspaced between the posterior tibialis in the flexor digitorum.  No definitive tendon tears seen.  Posterior tibialis tendon is thickened at 0.4 cm at the angle of the medial malleolus.  No other abnormalities noted

## 2012-06-22 NOTE — Assessment & Plan Note (Signed)
Noted on ultrasound with collapse of longitudinal arch and ankle pronation/subluxation. Additionally appears to be an accessory tendon interspace between the posterior tibialis and flexor digitorum.  Plan:  Compression Nitroglycerin patch protocol Posterior tibial strengthening exercises Sports insoles Followup 6 weeks. Do not use patch with Cialis

## 2012-07-26 ENCOUNTER — Other Ambulatory Visit: Payer: Self-pay | Admitting: Internal Medicine

## 2012-07-28 ENCOUNTER — Other Ambulatory Visit: Payer: Self-pay | Admitting: *Deleted

## 2012-07-28 ENCOUNTER — Ambulatory Visit (INDEPENDENT_AMBULATORY_CARE_PROVIDER_SITE_OTHER): Payer: BC Managed Care – PPO | Admitting: Sports Medicine

## 2012-07-28 VITALS — BP 112/70 | Ht 70.0 in | Wt 155.0 lb

## 2012-07-28 DIAGNOSIS — M76829 Posterior tibial tendinitis, unspecified leg: Secondary | ICD-10-CM

## 2012-07-28 DIAGNOSIS — M76822 Posterior tibial tendinitis, left leg: Secondary | ICD-10-CM

## 2012-07-28 MED ORDER — TADALAFIL 5 MG PO TABS: 5.0000 mg | ORAL_TABLET | Freq: Every day | ORAL | Status: DC | PRN

## 2012-07-28 NOTE — Telephone Encounter (Signed)
Pt came in the office to check on Cialis 5 mg req. Please advise.

## 2012-07-28 NOTE — Progress Notes (Signed)
Patient ID: Travis Morse, male   DOB: 07-Apr-1950, 62 y.o.   MRN: [id number]  Patient feels at least 50-60% better after 5 weeks of using nitroglycerin He is swelling around the posterior tibial tendon is much less using ankle compression body helix No side effects from nitroglycerin He has not been doing his exercises regularly but has increased his running from 2 miles to 4 miles 3-4 times a week He also went hiking doing 5-6 miles a day for 8 days with no problems  Physical examination  Less swelling around the tarsal tunnel at the posterior tibial tendon on the left No real tenderness to palpation Good foot and ankle motion  MS[facility] ultrasound Less swelling of the posterior tibial sheath been noted before He still appears to have an accessory tendon [place] scan today one of he tendon bundles is hyperechoic with a split I suspect this is a flexor digitorum that his accessory and maybe contributing to his symptoms

## 2012-07-28 NOTE — Patient Instructions (Addendum)
Thank you for coming in today. Wear the sleeve and the insoles in your shoes.  Try the pigeon toe walk forward and backwards.  Also do the heel raise exercises with pigeon toes.  Come back in 6 weeks or so.   Remove the patch 1 hr prior to Cialis use.  Nitroglycerin Protocol   Apply 1/4 nitroglycerin patch to affected area daily.  Change position of patch within the affected area every 24 hours.  You may experience a headache during the first 1-2 weeks of using the patch, these should subside.  If you experience headaches after beginning nitroglycerin patch treatment, you may take your preferred over the counter pain reliever.  Another side effect of the nitroglycerin patch is skin irritation or rash related to patch adhesive.  Please notify our office if you develop more severe headaches or rash, and stop the patch.  Tendon healing with nitroglycerin patch may require 12 to 24 weeks depending on the extent of injury.  Men should not use if taking Viagra, Cialis, or Levitra.   Do not use if you have migraines or rosacea.   

## 2012-07-28 NOTE — Assessment & Plan Note (Signed)
We will continue the same regimen we used before  I reinforced the exercises so that he can start in some of these  Recheck after 2 months

## 2012-08-03 ENCOUNTER — Ambulatory Visit: Payer: BC Managed Care – PPO | Admitting: Sports Medicine

## 2012-09-29 ENCOUNTER — Other Ambulatory Visit: Payer: Self-pay | Admitting: Internal Medicine

## 2012-11-14 ENCOUNTER — Ambulatory Visit (INDEPENDENT_AMBULATORY_CARE_PROVIDER_SITE_OTHER): Payer: BC Managed Care – PPO | Admitting: Sports Medicine

## 2012-11-14 ENCOUNTER — Encounter: Payer: Self-pay | Admitting: Sports Medicine

## 2012-11-14 VITALS — BP 115/72 | HR 48 | Ht 70.0 in | Wt 155.0 lb

## 2012-11-14 DIAGNOSIS — M2550 Pain in unspecified joint: Secondary | ICD-10-CM

## 2012-11-14 DIAGNOSIS — M76822 Posterior tibial tendinitis, left leg: Secondary | ICD-10-CM

## 2012-11-14 DIAGNOSIS — M76829 Posterior tibial tendinitis, unspecified leg: Secondary | ICD-10-CM

## 2012-11-14 NOTE — Progress Notes (Signed)
  Subjective:    Patient ID: Travis Morse, male    DOB: 1950/06/07, 62 y.o.   MRN: [id number]  HPI 62 y/o male here for a f/u visit for left posterior tibialis pain. Seen in May 2014 regarding this. He has been using a left ankle compression with nitro patch. No SE to the patch. He feels as his ankle pain has improved. Now running Q 4 days for 4 miles. No significant ankle pain with running.   Today he reports he has B/Lknee pain after running a strenuous run or bike ride. Pain usually after the run. Pain usually R>L. Describes a pain as "achy" pain, below the knees. Taking glucosamine supplements for 10 days and his pain is now a lot better.     Review of Systems     Objective:   Physical Exam Thin and in NAD  Left ankle - normal anterior drawer and talar tilt test. Has some mild point tenderness near the malleolus. Normal ligamentous laxity.  Post tib tendon is not swollen and only slightly TTP  Left knee - Good ROM, Negative McMurray test, no joint effusion or point tenderness Right knee - Good ROM, Negative McMurray test, no joint effusion or point tenderness   U/S left posterior tibialis - swelling has decreased markedly. He has some mild swelling at the insertion near the naviclar; tendon is intact   Assessment & Plan:   Posterior tibial tendinitis of left leg - pain overall improving. Wean off nitro patch. Continue with compression sleeve.  B/L knee pain - may be secondary to OA. Continue with glucosamine supplements. Exercises given for quad strengthening.

## 2012-11-14 NOTE — Assessment & Plan Note (Signed)
Some knee pain today  I recommended some quad strength work  No significant findings of DJD  Can cont glucosamine if seems helpful

## 2012-11-14 NOTE — Assessment & Plan Note (Signed)
This is much improved  Try to wean off NTG over the next 2 weeks  Keep up HEP  Use ankle compression sleeve next 3 mos and then prn but just for dancing or running  Reck with me in 8 wks if probs or PRN

## 2013-01-17 ENCOUNTER — Encounter: Payer: Self-pay | Admitting: Internal Medicine

## 2013-01-17 ENCOUNTER — Ambulatory Visit (INDEPENDENT_AMBULATORY_CARE_PROVIDER_SITE_OTHER): Payer: BC Managed Care – PPO | Admitting: Internal Medicine

## 2013-01-17 VITALS — BP 110/70 | HR 76 | Temp 97.3°F | Resp 16 | Wt 155.0 lb

## 2013-01-17 DIAGNOSIS — F411 Generalized anxiety disorder: Secondary | ICD-10-CM

## 2013-01-17 DIAGNOSIS — R413 Other amnesia: Secondary | ICD-10-CM

## 2013-01-17 DIAGNOSIS — N529 Male erectile dysfunction, unspecified: Secondary | ICD-10-CM

## 2013-01-17 DIAGNOSIS — Z23 Encounter for immunization: Secondary | ICD-10-CM

## 2013-01-17 DIAGNOSIS — R5381 Other malaise: Secondary | ICD-10-CM

## 2013-01-17 DIAGNOSIS — Z0000 Encounter for general adult medical examination without abnormal findings: Secondary | ICD-10-CM

## 2013-01-17 DIAGNOSIS — E039 Hypothyroidism, unspecified: Secondary | ICD-10-CM

## 2013-01-17 DIAGNOSIS — M2550 Pain in unspecified joint: Secondary | ICD-10-CM

## 2013-01-17 NOTE — Assessment & Plan Note (Signed)
Continue with current prescription therapy as reflected on the Med list.  

## 2013-01-17 NOTE — Patient Instructions (Signed)

## 2013-01-17 NOTE — Assessment & Plan Note (Signed)
Better on Glucosamine

## 2013-01-17 NOTE — Progress Notes (Signed)
Pre visit review using our clinic review tool, if applicable. No additional management support is needed unless otherwise documented below in the visit note. 

## 2013-01-17 NOTE — Progress Notes (Signed)
Subjective:    HPI  The patient is here to follow up on chronic depression, anxiety, hypothyroidism and a new chronic mild to moderate achyness and some stiffness symptoms x several months - better.   No joint swelling.    Wt Readings from Last 3 Encounters:  01/17/13 155 lb (70.308 kg)  11/14/12 155 lb (70.308 kg)  07/28/12 155 lb (70.308 kg)   BP Readings from Last 3 Encounters:  01/17/13 110/70  11/14/12 115/72  07/28/12 112/70    Review of Systems  Constitutional: Negative for appetite change, fatigue and unexpected weight change.  HENT: Negative for congestion, nosebleeds, sneezing, sore throat and trouble swallowing.   Eyes: Negative for itching and visual disturbance.  Respiratory: Negative for cough.   Cardiovascular: Negative for chest pain, palpitations and leg swelling.  Gastrointestinal: Negative for nausea, diarrhea, blood in stool and abdominal distention.  Genitourinary: Negative for frequency and hematuria.  Musculoskeletal: Negative for back pain, gait problem, joint swelling and neck pain.  Skin: Negative for rash.  Neurological: Negative for dizziness, tremors, speech difficulty and weakness.  Psychiatric/Behavioral: Negative for sleep disturbance, dysphoric mood and agitation. The patient is not nervous/anxious.        Objective:   Physical Exam  Constitutional: He is oriented to person, place, and time. He appears well-developed and well-nourished. No distress.  HENT:  Head: Normocephalic and atraumatic.  Right Ear: External ear normal.  Left Ear: External ear normal.  Nose: Nose normal.  Mouth/Throat: Oropharynx is clear and moist. No oropharyngeal exudate.  Eyes: Conjunctivae and EOM are normal. Pupils are equal, round, and reactive to light. Right eye exhibits no discharge. Left eye exhibits no discharge. No scleral icterus.  Neck: Normal range of motion. Neck supple. No JVD present. No tracheal deviation present. No thyromegaly present.   Cardiovascular: Normal rate, regular rhythm, normal heart sounds and intact distal pulses.  Exam reveals no gallop and no friction rub.   No murmur heard. Pulmonary/Chest: Effort normal and breath sounds normal. No stridor. No respiratory distress. He has no wheezes. He has no rales. He exhibits no tenderness.  Abdominal: Soft. Bowel sounds are normal. He exhibits no distension and no mass. There is no tenderness. There is no rebound and no guarding.  Genitourinary: Rectum normal, prostate normal and penis normal. Guaiac negative stool. No penile tenderness.  Musculoskeletal: Normal range of motion. He exhibits no edema and no tenderness.  Lymphadenopathy:    He has no cervical adenopathy.  Neurological: He is alert and oriented to person, place, and time. He has normal reflexes. No cranial nerve deficit. He exhibits normal muscle tone. Coordination normal.  Skin: Skin is warm and dry. No rash noted. He is not diaphoretic. No erythema. No pallor.  Psychiatric: He has a normal mood and affect. His behavior is normal. Judgment and thought content normal.     Lab Results  Component Value Date   WBC 4.3* 01/21/2011   HGB 13.3 01/21/2011   HCT 39.7 01/21/2011   PLT 239.0 01/21/2011   GLUCOSE 98 01/18/2012   CHOL 204* 01/21/2011   TRIG 33.0 01/21/2011   HDL 82.50 01/21/2011   LDLDIRECT 117.3 01/21/2011   LDLCALC 116* 01/13/2009   ALT 32 01/21/2011   AST 46* 01/21/2011   NA 139 01/18/2012   K 4.7 01/18/2012   CL 102 01/18/2012   CREATININE 0.7 01/18/2012   BUN 13 01/18/2012   CO2 29 01/18/2012   TSH 3.32 01/18/2012   PSA 0.52 01/21/2011  Assessment & Plan:

## 2013-01-19 ENCOUNTER — Other Ambulatory Visit (INDEPENDENT_AMBULATORY_CARE_PROVIDER_SITE_OTHER): Payer: BC Managed Care – PPO

## 2013-01-19 DIAGNOSIS — N529 Male erectile dysfunction, unspecified: Secondary | ICD-10-CM

## 2013-01-19 DIAGNOSIS — R413 Other amnesia: Secondary | ICD-10-CM

## 2013-01-19 DIAGNOSIS — R5381 Other malaise: Secondary | ICD-10-CM

## 2013-01-19 DIAGNOSIS — M2550 Pain in unspecified joint: Secondary | ICD-10-CM

## 2013-01-19 DIAGNOSIS — Z0000 Encounter for general adult medical examination without abnormal findings: Secondary | ICD-10-CM

## 2013-01-19 DIAGNOSIS — E039 Hypothyroidism, unspecified: Secondary | ICD-10-CM

## 2013-01-19 DIAGNOSIS — F411 Generalized anxiety disorder: Secondary | ICD-10-CM

## 2013-01-19 LAB — HEPATIC FUNCTION PANEL
AST: 30 U/L (ref 0–37)
Albumin: 3.8 g/dL (ref 3.5–5.2)
Alkaline Phosphatase: 67 U/L (ref 39–117)
Bilirubin, Direct: 0.1 mg/dL (ref 0.0–0.3)
Total Protein: 7 g/dL (ref 6.0–8.3)

## 2013-01-19 LAB — CBC WITH DIFFERENTIAL/PLATELET
Basophils Relative: 0.7 % (ref 0.0–3.0)
Eosinophils Relative: 3.4 % (ref 0.0–5.0)
HCT: 43 % (ref 39.0–52.0)
MCV: 96.4 fl (ref 78.0–100.0)
Monocytes Absolute: 0.4 10*3/uL (ref 0.1–1.0)
Neutrophils Relative %: 50.9 % (ref 43.0–77.0)
RBC: 4.46 Mil/uL (ref 4.22–5.81)
WBC: 4 10*3/uL — ABNORMAL LOW (ref 4.5–10.5)

## 2013-01-19 LAB — BASIC METABOLIC PANEL
BUN: 10 mg/dL (ref 6–23)
Calcium: 9.7 mg/dL (ref 8.4–10.5)
Chloride: 103 mEq/L (ref 96–112)
Creatinine, Ser: 0.7 mg/dL (ref 0.4–1.5)
GFR: 119.08 mL/min (ref >60.00)
Potassium: 4.8 mEq/L (ref 3.5–5.1)
Sodium: 138 mEq/L (ref 135–145)

## 2013-01-19 LAB — URINALYSIS
Bilirubin Urine: NEGATIVE
Hgb urine dipstick: NEGATIVE
Ketones, ur: NEGATIVE
Leukocytes, UA: NEGATIVE
Specific Gravity, Urine: 1.025 (ref 1.000–1.030)
Total Protein, Urine: NEGATIVE
Urine Glucose: NEGATIVE

## 2013-01-19 LAB — LIPID PANEL
HDL: 62.2 mg/dL (ref >39.00)
LDL Cholesterol: 125 mg/dL — ABNORMAL HIGH (ref 0–99)
Total CHOL/HDL Ratio: 3
Triglycerides: 25 mg/dL (ref 0.0–149.0)

## 2013-02-07 ENCOUNTER — Other Ambulatory Visit: Payer: Self-pay | Admitting: Internal Medicine

## 2013-03-14 ENCOUNTER — Encounter: Payer: Self-pay | Admitting: Sports Medicine

## 2013-03-14 ENCOUNTER — Ambulatory Visit (INDEPENDENT_AMBULATORY_CARE_PROVIDER_SITE_OTHER): Payer: BC Managed Care – PPO | Admitting: Sports Medicine

## 2013-03-14 VITALS — BP 139/81 | Ht 70.0 in | Wt 155.0 lb

## 2013-03-14 DIAGNOSIS — S82141A Displaced bicondylar fracture of right tibia, initial encounter for closed fracture: Secondary | ICD-10-CM

## 2013-03-14 DIAGNOSIS — S82109A Unspecified fracture of upper end of unspecified tibia, initial encounter for closed fracture: Secondary | ICD-10-CM

## 2013-03-14 NOTE — Patient Instructions (Addendum)
Thank you for coming in today  1. We will refer you to orthopedic surgery 2. Continue non-weightbearing until f/u with ortho 3. Continue leg exercises: - Hand bike - Okay for upper body weights - Quad sets - Straight leg raise - Hip abduction and adduction - Hip extension  You have been scheduled for an appointment with Dr. Berenice Primas at [place] on 03/22/13 at 10:30 am, please arrive at 10:15 am There office is located at [place]

## 2013-03-14 NOTE — Progress Notes (Signed)
CC: Right lateral tibial plateau fracture HPI: Patient is a very pleasant 64 year old male who is well-known in our clinic who presents for right lateral tibial plateau fracture. Unfortunately he suffered this injury while skiing out [place]. He underwent surgical repair and stabilization on 02/17/2013. He presents here for followup. He has been off of pain medication for some time now. He has been working on some home exercises including isometric quad exercises and straight leg raise. He has multiple questions for [place] about use of bandages and compression as well as return to activity. He is currently in a hinged knee brace set from 0-90. He is using crutches full time. He was seen in [place] for suture removal and wound check where the nurse practitioner had some concern for some signs of skin infection and treated him with a seven-day course of antibiotic. He denies any fevers or chills.  ROS: As above in the HPI. All other systems are stable or negative.  OBJECTIVE: APPEARANCE:  Patient in no acute distress.The patient appeared well nourished and normally developed. HEENT: No scleral icterus. Conjunctiva non-injected Resp: Non labored Skin: No rash MSK:  Right leg: - Well-healing surgical incision with no sign of infection - Range of motion is from 10-90 - Very mild tenderness to palpation over tibial plateau   MSK [place]: Not performed   ASSESSMENT: #1. 3-1/2 weeks postop from operative repair of right lateral tibial plateau fracture  PLAN: All patient's questions were answered and recommendations for care discussed. We did recommend that he seek consultation from an orthopedic surgeon for postoperative management and progression of activity and weightbearing. We will refer him to [facility] for this. We have recommended that he remain nonweightbearing at this time. There is no sign of wound infection. He was given some exercises to maintain his leg strength including isometric  quad, straight leg raise, hip abduction, hip extension exercises. We encouraged use of hand bike. He was also told was okay for him to do a stationary bicycle with 0 resistance by his surgeon and we stated that this would be okay it was okay by his surgeon. He may discontinue the wound bandages and TED hose at this time. We will see him back when he is progressed back into activity so that we can analyze his gait and see if he needs any changes to his orthotics to correct any positional abnormalities post injury.

## 2013-03-15 DIAGNOSIS — S82141A Displaced bicondylar fracture of right tibia, initial encounter for closed fracture: Secondary | ICD-10-CM | POA: Insufficient documentation

## 2013-03-15 NOTE — Assessment & Plan Note (Signed)
Considering the significant nature of his fracture and surgical repair I think he should be followed in the orthopedic office and we will refer him for this

## 2013-04-13 ENCOUNTER — Other Ambulatory Visit: Payer: Self-pay | Admitting: *Deleted

## 2013-04-13 MED ORDER — DONEPEZIL HCL 10 MG PO TABS: 10.0000 mg | ORAL_TABLET | Freq: Every day | ORAL | Status: DC

## 2013-08-09 ENCOUNTER — Other Ambulatory Visit: Payer: Self-pay | Admitting: Internal Medicine

## 2013-08-27 ENCOUNTER — Other Ambulatory Visit (INDEPENDENT_AMBULATORY_CARE_PROVIDER_SITE_OTHER): Payer: BC Managed Care – PPO

## 2013-08-27 ENCOUNTER — Encounter: Payer: Self-pay | Admitting: Internal Medicine

## 2013-08-27 ENCOUNTER — Ambulatory Visit (INDEPENDENT_AMBULATORY_CARE_PROVIDER_SITE_OTHER): Payer: BC Managed Care – PPO | Admitting: Internal Medicine

## 2013-08-27 VITALS — BP 96/70 | HR 64 | Temp 98.6°F | Resp 16 | Wt 160.0 lb

## 2013-08-27 DIAGNOSIS — N529 Male erectile dysfunction, unspecified: Secondary | ICD-10-CM

## 2013-08-27 DIAGNOSIS — R413 Other amnesia: Secondary | ICD-10-CM

## 2013-08-27 DIAGNOSIS — F411 Generalized anxiety disorder: Secondary | ICD-10-CM

## 2013-08-27 DIAGNOSIS — E039 Hypothyroidism, unspecified: Secondary | ICD-10-CM

## 2013-08-27 DIAGNOSIS — S8290XS Unspecified fracture of unspecified lower leg, sequela: Secondary | ICD-10-CM

## 2013-08-27 DIAGNOSIS — S82141S Displaced bicondylar fracture of right tibia, sequela: Secondary | ICD-10-CM

## 2013-08-27 LAB — BASIC METABOLIC PANEL
BUN: 10 mg/dL (ref 6–23)
CO2: 32 mEq/L (ref 19–32)
CREATININE: 0.8 mg/dL (ref 0.4–1.5)
Calcium: 10 mg/dL (ref 8.4–10.5)
Chloride: 102 mEq/L (ref 96–112)
GFR: 103.56 mL/min (ref >60.00)
GLUCOSE: 99 mg/dL (ref 70–99)
Potassium: 4.3 mEq/L (ref 3.5–5.1)
Sodium: 140 mEq/L (ref 135–145)

## 2013-08-27 LAB — HEPATIC FUNCTION PANEL
ALT: 20 U/L (ref 0–53)
AST: 31 U/L (ref 0–37)
Albumin: 4.1 g/dL (ref 3.5–5.2)
Alkaline Phosphatase: 68 U/L (ref 39–117)
BILIRUBIN TOTAL: 0.6 mg/dL (ref 0.2–1.2)
Bilirubin, Direct: 0.1 mg/dL (ref 0.0–0.3)
Total Protein: 7.7 g/dL (ref 6.0–8.3)

## 2013-08-27 LAB — T4, FREE: Free T4: 0.78 ng/dL (ref 0.60–1.60)

## 2013-08-27 LAB — CBC WITH DIFFERENTIAL/PLATELET
BASOS ABS: 0 10*3/uL (ref 0.0–0.1)
Basophils Relative: 0.4 % (ref 0.0–3.0)
EOS PCT: 2.5 % (ref 0.0–5.0)
Eosinophils Absolute: 0.1 10*3/uL (ref 0.0–0.7)
HEMATOCRIT: 44.7 % (ref 39.0–52.0)
Hemoglobin: 15.2 g/dL (ref 13.0–17.0)
LYMPHS ABS: 1.5 10*3/uL (ref 0.7–4.0)
Lymphocytes Relative: 28.9 % (ref 12.0–46.0)
MCHC: 34 g/dL (ref 30.0–36.0)
MCV: 96.7 fl (ref 78.0–100.0)
Monocytes Absolute: 0.4 10*3/uL (ref 0.1–1.0)
Monocytes Relative: 8.3 % (ref 3.0–12.0)
NEUTROS PCT: 59.9 % (ref 43.0–77.0)
Neutro Abs: 3.1 10*3/uL (ref 1.4–7.7)
PLATELETS: 243 10*3/uL (ref 150.0–400.0)
RBC: 4.62 Mil/uL (ref 4.22–5.81)
RDW: 13.1 % (ref 11.5–15.5)
WBC: 5.1 10*3/uL (ref 4.0–10.5)

## 2013-08-27 LAB — TESTOSTERONE: TESTOSTERONE: 337.04 ng/dL (ref 300.00–890.00)

## 2013-08-27 LAB — VITAMIN B12: Vitamin B-12: 853 pg/mL (ref 211–911)

## 2013-08-27 LAB — TSH: TSH: 2.61 u[IU]/mL (ref 0.35–4.50)

## 2013-08-27 NOTE — Assessment & Plan Note (Signed)
Continue with current prescription therapy as reflected on the Med list.  

## 2013-08-27 NOTE — Assessment & Plan Note (Signed)
Doing well 

## 2013-08-27 NOTE — Progress Notes (Signed)
Pre visit review using our clinic review tool, if applicable. No additional management support is needed unless otherwise documented below in the visit note. 

## 2013-08-27 NOTE — Assessment & Plan Note (Signed)
Surgical repair Dec 2014 Plate/pins Doing well overall

## 2013-08-27 NOTE — Progress Notes (Signed)
Subjective:    HPI  C/o fatigue x 2-3 mo  C/o cognitive issues lately  The patient is here to follow up on chronic depression, anxiety, hypothyroidism and a new chronic mild to moderate achyness and some stiffness symptoms x several months - better. F/u ED. C/o sweaty feet   No joint swelling.    Wt Readings from Last 3 Encounters:  08/27/13 160 lb (72.576 kg)  03/14/13 155 lb (70.308 kg)  01/17/13 155 lb (70.308 kg)   BP Readings from Last 3 Encounters:  08/27/13 96/70  03/14/13 139/81  01/17/13 110/70    Review of Systems  Constitutional: Negative for appetite change, fatigue and unexpected weight change.  HENT: Negative for congestion, nosebleeds, sneezing, sore throat and trouble swallowing.   Eyes: Negative for itching and visual disturbance.  Respiratory: Negative for cough.   Cardiovascular: Negative for chest pain, palpitations and leg swelling.  Gastrointestinal: Negative for nausea, diarrhea, blood in stool and abdominal distention.  Genitourinary: Negative for frequency and hematuria.  Musculoskeletal: Negative for back pain, gait problem, joint swelling and neck pain.  Skin: Negative for rash.  Neurological: Negative for dizziness, tremors, speech difficulty and weakness.  Psychiatric/Behavioral: Negative for sleep disturbance, dysphoric mood and agitation. The patient is not nervous/anxious.        Objective:   Physical Exam  Constitutional: He is oriented to person, place, and time. He appears well-developed and well-nourished. No distress.  HENT:  Head: Normocephalic and atraumatic.  Right Ear: External ear normal.  Left Ear: External ear normal.  Nose: Nose normal.  Mouth/Throat: Oropharynx is clear and moist. No oropharyngeal exudate.  Eyes: Conjunctivae and EOM are normal. Pupils are equal, round, and reactive to light. Right eye exhibits no discharge. Left eye exhibits no discharge. No scleral icterus.  Neck: Normal range of motion. Neck  supple. No JVD present. No tracheal deviation present. No thyromegaly present.  Cardiovascular: Normal rate, regular rhythm, normal heart sounds and intact distal pulses.  Exam reveals no gallop and no friction rub.   No murmur heard. Pulmonary/Chest: Effort normal and breath sounds normal. No stridor. No respiratory distress. He has no wheezes. He has no rales. He exhibits no tenderness.  Abdominal: Soft. Bowel sounds are normal. He exhibits no distension and no mass. There is no tenderness. There is no rebound and no guarding.  Genitourinary: Rectum normal, prostate normal and penis normal. Guaiac negative stool. No penile tenderness.  Musculoskeletal: Normal range of motion. He exhibits no edema and no tenderness.  Lymphadenopathy:    He has no cervical adenopathy.  Neurological: He is alert and oriented to person, place, and time. He has normal reflexes. No cranial nerve deficit. He exhibits normal muscle tone. Coordination normal.  Skin: Skin is warm and dry. No rash noted. He is not diaphoretic. No erythema. No pallor.  Psychiatric: He has a normal mood and affect. His behavior is normal. Judgment and thought content normal.  R lat shin scar   Lab Results  Component Value Date   WBC 4.0* 01/19/2013   HGB 14.6 01/19/2013   HCT 43.0 01/19/2013   PLT 211.0 01/19/2013   GLUCOSE 95 01/19/2013   CHOL 192 01/19/2013   TRIG 25.0 01/19/2013   HDL 62.20 01/19/2013   LDLDIRECT 117.3 01/21/2011   LDLCALC 125* 01/19/2013   ALT 20 01/19/2013   AST 30 01/19/2013   NA 138 01/19/2013   K 4.8 01/19/2013   CL 103 01/19/2013   CREATININE 0.7 01/19/2013   BUN 10  01/19/2013   CO2 30 01/19/2013   TSH 3.42 01/19/2013   PSA 0.91 01/19/2013        Assessment & Plan:

## 2013-09-10 ENCOUNTER — Encounter: Payer: Self-pay | Admitting: *Deleted

## 2013-09-14 ENCOUNTER — Encounter: Payer: Self-pay | Admitting: Neurology

## 2013-09-14 ENCOUNTER — Ambulatory Visit (INDEPENDENT_AMBULATORY_CARE_PROVIDER_SITE_OTHER): Payer: BC Managed Care – PPO | Admitting: Neurology

## 2013-09-14 VITALS — BP 112/70 | HR 88 | Temp 98.2°F | Resp 16 | Ht 70.0 in | Wt 159.7 lb

## 2013-09-14 DIAGNOSIS — R413 Other amnesia: Secondary | ICD-10-CM

## 2013-09-14 NOTE — Patient Instructions (Signed)
We will set you up for formal neuropsychological testing.  This is a detailed test to assess for cognitive impairment.  If there is no evidence of cognitive impairment, then you may not need to be on aricept.  I want to see you soon after the test to discuss further.

## 2013-09-14 NOTE — Progress Notes (Signed)
NEUROLOGY CONSULTATION NOTE  Travis Morse MRN: [id number] DOB: 04-05-1950  Referring provider: Dr. Alain Marion Primary care provider: Dr. Alain Marion  Reason for consult:  Memory problems.  HISTORY OF PRESENT ILLNESS: Travis Morse is a 63 year old right-handed man with hypothyroidism and anxiety who presents for memory problems.  Records reviewed.  He began noticing symptoms approximately 5 years ago. He started experiencing anxiety and was having difficulty falling asleep. He noticed difficulty making decisions which also affected his work. 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 His PCP told him he had depression and was tried on several antidepressants, which were ineffective. He had been on antidepressants for approximately 3 years. In regards to memory, he will experience short-term memory problems. For example, if he goes out of the house, he will forget to pick something up and would have to drive back home to get it. For acquaintances that he knows for over 20 years, he has no problems with names. For people that he none for only the last 2-5 years, he has no problems remembering the names if he sees them regularly but will have some problems with people he sees infrequently. He also notices that if he listens to songs, he will sometimes forget the names. He denies any difficulty paying the bills. He is mostly retired. He denies any hallucinations or delusions. He denies any disorientation while driving. He is concerned about Alzheimer's disease, because his paternal grandmother and great-grandmother both had dementia.  He currently takes Aricept 10mg  at bedtime, which she has been on for 5 years, as well as levothyroxine 25mg  mcg daily, B12 supplementation, and fish oil.  08/27/13 LABS:  TSH 2.61, free T4 0.78, testosterone 337.04, B12 853  PAST MEDICAL HISTORY: Past Medical History  Diagnosis Date  . Allergy     allergic to bee stings  . Anxiety   .  Depression   . Insomnia   . Hypothyroidism 2008  . Memory difficulty   . Multiple thyroid nodules   . Hx of colonic polyps     Dr Henrene Pastor    PAST SURGICAL HISTORY: No past surgical history on file.  MEDICATIONS: Current Outpatient Prescriptions on File Prior to Visit  Medication Sig Dispense Refill  . Beet Root POWD Take 6 oz by mouth 3 (three) times daily.      . cholecalciferol (VITAMIN D) 1000 UNITS tablet Take 1,000 Units by mouth daily.        Marland Kitchen CIALIS 5 MG tablet TAKE ONE TABLET BY MOUTH ONCE DAILY AS NEEDED FOR  ERECTILE  DYSFUNCTION  30 tablet  0  . Cyanocobalamin (VITAMIN B-12) 500 MCG SUBL 1 tab sl qd  100 tablet  3  . donepezil (ARICEPT) 10 MG tablet Take 1 tablet (10 mg total) by mouth at bedtime.  30 tablet  5  . fish oil-omega-3 fatty acids 1000 MG capsule Take 2 g by mouth daily.        Marland Kitchen levothyroxine (SYNTHROID, LEVOTHROID) 25 MCG tablet TAKE ONE TABLET BY MOUTH ONCE DAILY  30 tablet  11  . nitroGLYCERIN (NITRODUR - DOSED IN MG/24 HR) 0.2 mg/hr 1/4 patch to ankle daily for painjscript:void(0)  30 patch  1   No current facility-administered medications on file prior to visit.    ALLERGIES: Allergies  Allergen Reactions  . Penicillins     FAMILY HISTORY: Family History  Problem Relation Age of Onset  . Coronary artery disease Father     63's  .  Heart disease Father 43    MI  . Thyroid disease Neg Hx     SOCIAL HISTORY: History   Social History  . Marital Status: Single    Spouse Name: N/A    Number of Children: N/A  . Years of Education: N/A   Occupational History  . Not on file.   Social History Main Topics  . Smoking status: Never Smoker   . Smokeless tobacco: Never Used  . Alcohol Use: Yes     Comment: 1 glass a wine a day   . Drug Use: No  . Sexual Activity: Yes    Partners: Female   Other Topics Concern  . Not on file   Social History Narrative  . No narrative on file    REVIEW OF SYSTEMS: Constitutional: No fevers, chills, or  sweats, no generalized fatigue, change in appetite Eyes: No visual changes, double vision, eye pain Ear, nose and throat: No hearing loss, ear pain, nasal congestion, sore throat Cardiovascular: No chest pain, palpitations Respiratory:  No shortness of breath at rest or with exertion, wheezes GastrointestinaI: No nausea, vomiting, diarrhea, abdominal pain, fecal incontinence Genitourinary:  No dysuria, urinary retention or frequency Musculoskeletal:  No neck pain, back pain Integumentary: No rash, pruritus, skin lesions Neurological: as above Psychiatric: No depression, insomnia, anxiety Endocrine: No palpitations, fatigue, diaphoresis, mood swings, change in appetite, change in weight, increased thirst Hematologic/Lymphatic:  No anemia, purpura, petechiae. Allergic/Immunologic: no itchy/runny eyes, nasal congestion, recent allergic reactions, rashes  PHYSICAL EXAM: Filed Vitals:   09/14/13 0847  BP: 112/70  Pulse: 88  Temp: 98.2 F (36.8 C)  Resp: 16   General: No acute distress Head:  Normocephalic/atraumatic Neck: supple, no paraspinal tenderness, full range of motion Back: No paraspinal tenderness Heart: regular rate and rhythm Lungs: Clear to auscultation bilaterally. Vascular: No carotid bruits. Neurological Exam: Mental status: alert and oriented to person, place, and time, recalled 2/5 words after 5 minutes, remote memory intact, fund of knowledge intact, attention and concentration intact, visual spatial and executive functioning overall intact (he drew the trail making test correctly up until the very end. He was able to copy a cube or draw a clock correctly), attention, including serial 7 subtraction, intact. Abstract thinking intact. speech fluent and not dysarthric, language including naming, repetition, following commands, and naming fluency, intact.  MoCA 26/30 Cranial nerves: CN I: not tested CN II: pupils equal, round and reactive to light, visual fields intact,  fundi unremarkable, without vessel changes, exudates, hemorrhages or papilledema. CN III, IV, VI:  full range of motion, no nystagmus, no ptosis CN V: facial sensation intact CN VII: upper and lower face symmetric CN VIII: hearing intact CN IX, X: gag intact, uvula midline CN XI: sternocleidomastoid and trapezius muscles intact CN XII: tongue midline Bulk & Tone: normal, no fasciculations. Motor: 5 out of 5 throughout Sensation: Temperature and vibration intact Deep Tendon Reflexes: 2+ throughout, toes downgoing Finger to nose testing: No dysmetria Heel to shin: No dysmetria Gait: Normal station and stride. Able to turn and walk in tandem. Romberg negative.  IMPRESSION: Memory deficits. No significant abnormalities seen on today's smoker. He recalled 2/5 words after 5 minutes. He was able to complete the trail making test correctly up until the very and, which may have been do to not paying attention. I don't really appreciate any mild cognitive impairment.  PLAN: 1. Will refer for formal neuropsychological testing. If there is no sign of cognitive impairment, I would recommend discontinuing the Aricept.  2. He will followup with me after this test.  45 minutes spent with the patient, over 50% spent counseling and coordinating care.  Thank you for allowing me to take part in the care of this patient.  Metta Clines, DO  CC: Lew Dawes, MD

## 2013-11-09 ENCOUNTER — Encounter: Payer: Self-pay | Admitting: Internal Medicine

## 2013-11-22 ENCOUNTER — Other Ambulatory Visit: Payer: Self-pay | Admitting: Internal Medicine

## 2013-12-06 ENCOUNTER — Other Ambulatory Visit: Payer: Self-pay | Admitting: Internal Medicine

## 2013-12-10 ENCOUNTER — Telehealth: Payer: Self-pay | Admitting: Internal Medicine

## 2013-12-10 NOTE — Telephone Encounter (Signed)
Patient Information:  Caller Name: Mac  Phone: [phone]  Patient: Travis Morse, Travis Morse  Gender: Male  DOB: 11-20-1950  Age: 63 Years  PCP: Plotnikov, Alex (Adults only)  Office Follow Up:  Does the office need to follow up with this patient?: No  Instructions For The Office: N/A  RN Note:  All emergent sxs ruled out as per [facility] protocol with exception to 'Follows prolonged and intense exercise and not previously evaluated.'   Care advice given and advised pt of need for appt within 2-3 day.   Dr Alain Marion does not have available appt within time frame, recommended alternate physician but pt prefers to wait for appt with Dr Alain Marion.  Spoke with office and they scheduled appt for 12/14/13 at 1430 with Dr Alain Marion.  Pt to call if any changes or worsening sxs.  Symptoms  Reason For Call & Symptoms: Pt is a runner and has noticed over the past two weeks with vigorous aerobic exercise he is having dark urine, brownish-red, possibly blood in urine after about 2 hours x 1 and then returns to regular yellow color.  This only occurs on the days he does his intense workouts.  This has occurred 3 times in the 2 week period.  Last episode on 12/07/13.   Afebrile.  No other UTI sxs.  Reviewed Health History In EMR: Yes  Reviewed Medications In EMR: Yes  Reviewed Allergies In EMR: Yes  Reviewed Surgeries / Procedures: Yes  Date of Onset of Symptoms: 11/26/2013  Guideline(s) Used:  No Protocol Available - Sick Adult  Disposition Per Guideline:   See Within 3 Days in Office  Reason For Disposition Reached:   Nursing judgment  Advice Given:  N/A  Patient Refused Recommendation:  Patient Will Make Own Appointment  Pt prefers to wait for appt with Dr Alain Marion vs appt with another physician within 2-3 days.

## 2013-12-11 ENCOUNTER — Telehealth: Payer: Self-pay | Admitting: Internal Medicine

## 2013-12-11 DIAGNOSIS — R319 Hematuria, unspecified: Secondary | ICD-10-CM

## 2013-12-11 NOTE — Telephone Encounter (Signed)
Patient has an appt Friday for blood in his urine.  He will be near the office today and wanted to know if Dr. Camila Li wanted him to go to the lab to drop a specimen off.

## 2013-12-11 NOTE — Telephone Encounter (Signed)
Yes pls - UA

## 2013-12-12 ENCOUNTER — Other Ambulatory Visit (INDEPENDENT_AMBULATORY_CARE_PROVIDER_SITE_OTHER): Payer: BC Managed Care – PPO

## 2013-12-12 DIAGNOSIS — R319 Hematuria, unspecified: Secondary | ICD-10-CM

## 2013-12-12 LAB — URINALYSIS, ROUTINE W REFLEX MICROSCOPIC
Bilirubin Urine: NEGATIVE
Hgb urine dipstick: NEGATIVE
KETONES UR: 15 — AB
Specific Gravity, Urine: 1.025 (ref 1.000–1.030)
Total Protein, Urine: 30 — AB
URINE GLUCOSE: NEGATIVE
UROBILINOGEN UA: 1 (ref 0.0–1.0)
pH: 6.5 (ref 5.0–8.0)

## 2013-12-12 NOTE — Telephone Encounter (Signed)
UA ordered. Left detailed mess informing pt.

## 2013-12-14 ENCOUNTER — Ambulatory Visit (INDEPENDENT_AMBULATORY_CARE_PROVIDER_SITE_OTHER): Payer: BC Managed Care – PPO | Admitting: Internal Medicine

## 2013-12-14 ENCOUNTER — Encounter: Payer: Self-pay | Admitting: Internal Medicine

## 2013-12-14 VITALS — BP 132/94 | HR 68 | Temp 98.1°F | Resp 16 | Wt 160.0 lb

## 2013-12-14 DIAGNOSIS — R8299 Other abnormal findings in urine: Secondary | ICD-10-CM

## 2013-12-14 DIAGNOSIS — Z23 Encounter for immunization: Secondary | ICD-10-CM

## 2013-12-14 DIAGNOSIS — R310 Gross hematuria: Secondary | ICD-10-CM

## 2013-12-14 DIAGNOSIS — R82998 Other abnormal findings in urine: Secondary | ICD-10-CM | POA: Insufficient documentation

## 2013-12-14 NOTE — Progress Notes (Signed)
Pre visit review using our clinic review tool, if applicable. No additional management support is needed unless otherwise documented below in the visit note. 

## 2013-12-14 NOTE — Progress Notes (Signed)
Subjective:    Hematuria This is a new (had it before too; after running 5k) problem. The current episode started 1 to 4 weeks ago. The problem has been waxing and waning since onset. He describes the hematuria as gross hematuria. The hematuria occurs throughout his entire urinary stream. He reports no clotting in his urine stream. He is experiencing no pain. He describes his urine color as tea colored. Irritative symptoms do not include frequency. Pertinent negatives include no nausea. He is sexually active.    The patient is here to follow up on chronic depression, anxiety, hypothyroidism and a new chronic mild to moderate achyness and some stiffness symptoms x several months - better. F/u ED. C/o sweaty feet   No joint swelling.    Wt Readings from Last 3 Encounters:  12/14/13 160 lb (72.576 kg)  09/14/13 159 lb 11.2 oz (72.439 kg)  08/27/13 160 lb (72.576 kg)   BP Readings from Last 3 Encounters:  12/14/13 132/94  09/14/13 112/70  08/27/13 96/70    Review of Systems  Constitutional: Negative for appetite change, fatigue and unexpected weight change.  HENT: Negative for congestion, nosebleeds, sneezing, sore throat and trouble swallowing.   Eyes: Negative for itching and visual disturbance.  Respiratory: Negative for cough.   Cardiovascular: Negative for chest pain, palpitations and leg swelling.  Gastrointestinal: Negative for nausea, diarrhea, blood in stool and abdominal distention.  Genitourinary: Negative for frequency and hematuria.  Musculoskeletal: Negative for back pain, gait problem, joint swelling and neck pain.  Skin: Negative for rash.  Neurological: Negative for dizziness, tremors, speech difficulty and weakness.  Psychiatric/Behavioral: Negative for sleep disturbance, dysphoric mood and agitation. The patient is not nervous/anxious.        Objective:   Physical Exam  Constitutional: He is oriented to person, place, and time. He appears well-developed and  well-nourished. No distress.  HENT:  Head: Normocephalic and atraumatic.  Right Ear: External ear normal.  Left Ear: External ear normal.  Nose: Nose normal.  Mouth/Throat: Oropharynx is clear and moist. No oropharyngeal exudate.  Eyes: Conjunctivae and EOM are normal. Pupils are equal, round, and reactive to light. Right eye exhibits no discharge. Left eye exhibits no discharge. No scleral icterus.  Neck: Normal range of motion. Neck supple. No JVD present. No tracheal deviation present. No thyromegaly present.  Cardiovascular: Normal rate, regular rhythm, normal heart sounds and intact distal pulses.  Exam reveals no gallop and no friction rub.   No murmur heard. Pulmonary/Chest: Effort normal and breath sounds normal. No stridor. No respiratory distress. He has no wheezes. He has no rales. He exhibits no tenderness.  Abdominal: Soft. Bowel sounds are normal. He exhibits no distension and no mass. There is no tenderness. There is no rebound and no guarding.  Genitourinary: Rectum normal, prostate normal and penis normal. Guaiac negative stool. No penile tenderness.  Musculoskeletal: Normal range of motion. He exhibits no edema and no tenderness.  Lymphadenopathy:    He has no cervical adenopathy.  Neurological: He is alert and oriented to person, place, and time. He has normal reflexes. No cranial nerve deficit. He exhibits normal muscle tone. Coordination normal.  Skin: Skin is warm and dry. No rash noted. He is not diaphoretic. No erythema. No pallor.  Psychiatric: He has a normal mood and affect. His behavior is normal. Judgment and thought content normal.  R lat shin scar   Lab Results  Component Value Date   WBC 5.1 08/27/2013   HGB 15.2 08/27/2013  HCT 44.7 08/27/2013   PLT 243.0 08/27/2013   GLUCOSE 99 08/27/2013   CHOL 192 01/19/2013   TRIG 25.0 01/19/2013   HDL 62.20 01/19/2013   LDLDIRECT 117.3 01/21/2011   LDLCALC 125* 01/19/2013   ALT 20 08/27/2013   AST 31 08/27/2013    NA 140 08/27/2013   K 4.3 08/27/2013   CL 102 08/27/2013   CREATININE 0.8 08/27/2013   BUN 10 08/27/2013   CO2 32 08/27/2013   TSH 2.61 08/27/2013   PSA 0.91 01/19/2013        Assessment & Plan:

## 2013-12-14 NOTE — Assessment & Plan Note (Signed)
10/15 beet powder vs blood vs other; UA was nl Repeat UA, add LFT, INR, CBC

## 2014-02-21 ENCOUNTER — Other Ambulatory Visit (INDEPENDENT_AMBULATORY_CARE_PROVIDER_SITE_OTHER): Payer: BC Managed Care – PPO

## 2014-02-21 ENCOUNTER — Encounter: Payer: Self-pay | Admitting: Internal Medicine

## 2014-02-21 ENCOUNTER — Other Ambulatory Visit: Payer: Self-pay | Admitting: Internal Medicine

## 2014-02-21 ENCOUNTER — Ambulatory Visit (INDEPENDENT_AMBULATORY_CARE_PROVIDER_SITE_OTHER): Payer: BC Managed Care – PPO | Admitting: Internal Medicine

## 2014-02-21 VITALS — BP 104/78 | HR 49 | Temp 98.5°F | Ht 70.0 in | Wt 161.0 lb

## 2014-02-21 DIAGNOSIS — N529 Male erectile dysfunction, unspecified: Secondary | ICD-10-CM

## 2014-02-21 DIAGNOSIS — N528 Other male erectile dysfunction: Secondary | ICD-10-CM

## 2014-02-21 DIAGNOSIS — E034 Atrophy of thyroid (acquired): Secondary | ICD-10-CM

## 2014-02-21 DIAGNOSIS — E038 Other specified hypothyroidism: Secondary | ICD-10-CM

## 2014-02-21 DIAGNOSIS — R413 Other amnesia: Secondary | ICD-10-CM

## 2014-02-21 LAB — BASIC METABOLIC PANEL
BUN: 11 mg/dL (ref 6–23)
CALCIUM: 9.9 mg/dL (ref 8.4–10.5)
CO2: 27 mEq/L (ref 19–32)
CREATININE: 0.7 mg/dL (ref 0.4–1.5)
Chloride: 103 mEq/L (ref 96–112)
GFR: 113.13 mL/min (ref >60.00)
GLUCOSE: 94 mg/dL (ref 70–99)
Potassium: 5 mEq/L (ref 3.5–5.1)
SODIUM: 140 meq/L (ref 135–145)

## 2014-02-21 LAB — TSH: TSH: 2.42 u[IU]/mL (ref 0.35–4.50)

## 2014-02-21 MED ORDER — LEVOTHYROXINE SODIUM 25 MCG PO TABS: 25.0000 ug | ORAL_TABLET | Freq: Every day | ORAL | Status: DC

## 2014-02-21 NOTE — Progress Notes (Signed)
Pre visit review using our clinic review tool, if applicable. No additional management support is needed unless otherwise documented below in the visit note. 

## 2014-02-21 NOTE — Assessment & Plan Note (Signed)
Continue with current prescription therapy as reflected on the Med list.  

## 2014-02-21 NOTE — Progress Notes (Signed)
   Subjective:    HPI  The patient is here to follow up on chronic depression - worse lately, anxiety, hypothyroidism and a new chronic mild to moderate achyness and some stiffness symptoms x several months - better. F/u ED. F/u memory issues - extensive memory test (2015) was ok  No joint swelling.    Wt Readings from Last 3 Encounters:  02/21/14 161 lb (73.029 kg)  12/14/13 160 lb (72.576 kg)  09/14/13 159 lb 11.2 oz (72.439 kg)   BP Readings from Last 3 Encounters:  02/21/14 104/78  12/14/13 132/94  09/14/13 112/70    Review of Systems  Constitutional: Negative for appetite change, fatigue and unexpected weight change.  HENT: Negative for congestion, nosebleeds, sneezing, sore throat and trouble swallowing.   Eyes: Negative for itching and visual disturbance.  Respiratory: Negative for cough.   Cardiovascular: Negative for chest pain, palpitations and leg swelling.  Gastrointestinal: Negative for diarrhea, blood in stool and abdominal distention.  Musculoskeletal: Negative for back pain, joint swelling, gait problem and neck pain.  Skin: Negative for rash.  Neurological: Negative for dizziness, tremors, speech difficulty and weakness.  Psychiatric/Behavioral: Negative for sleep disturbance, dysphoric mood and agitation. The patient is not nervous/anxious.        Objective:   Physical Exam  Constitutional: He is oriented to person, place, and time. He appears well-developed. No distress.  NAD  HENT:  Mouth/Throat: Oropharynx is clear and moist.  Eyes: Conjunctivae are normal. Pupils are equal, round, and reactive to light.  Neck: Normal range of motion. No JVD present. No thyromegaly present.  Cardiovascular: Normal rate, regular rhythm, normal heart sounds and intact distal pulses.  Exam reveals no gallop and no friction rub.   No murmur heard. Pulmonary/Chest: Effort normal and breath sounds normal. No respiratory distress. He has no wheezes. He has no rales. He  exhibits no tenderness.  Abdominal: Soft. Bowel sounds are normal. He exhibits no distension and no mass. There is no tenderness. There is no rebound and no guarding.  Musculoskeletal: Normal range of motion. He exhibits no edema or tenderness.  Lymphadenopathy:    He has no cervical adenopathy.  Neurological: He is alert and oriented to person, place, and time. He has normal reflexes. No cranial nerve deficit. He exhibits normal muscle tone. He displays a negative Romberg sign. Coordination and gait normal.  No meningeal signs  Skin: Skin is warm and dry. No rash noted.  Psychiatric: He has a normal mood and affect. His behavior is normal. Judgment and thought content normal.  R lat shin scar   Lab Results  Component Value Date   WBC 5.1 08/27/2013   HGB 15.2 08/27/2013   HCT 44.7 08/27/2013   PLT 243.0 08/27/2013   GLUCOSE 99 08/27/2013   CHOL 192 01/19/2013   TRIG 25.0 01/19/2013   HDL 62.20 01/19/2013   LDLDIRECT 117.3 01/21/2011   LDLCALC 125* 01/19/2013   ALT 20 08/27/2013   AST 31 08/27/2013   NA 140 08/27/2013   K 4.3 08/27/2013   CL 102 08/27/2013   CREATININE 0.8 08/27/2013   BUN 10 08/27/2013   CO2 32 08/27/2013   TSH 2.61 08/27/2013   PSA 0.91 01/19/2013        Assessment & Plan:

## 2014-02-21 NOTE — Patient Instructions (Signed)
Low carb diet 

## 2014-02-21 NOTE — Assessment & Plan Note (Signed)
Chronic, minor Dr Tomi Likens  F/u memory issues - extensive memory test (2015) was ok The pt would like to cont Aricept

## 2014-05-27 ENCOUNTER — Other Ambulatory Visit: Payer: Self-pay | Admitting: *Deleted

## 2014-05-27 MED ORDER — LEVOTHYROXINE SODIUM 25 MCG PO TABS: 25.0000 ug | ORAL_TABLET | Freq: Every day | ORAL | Status: DC

## 2014-06-12 ENCOUNTER — Encounter: Payer: Self-pay | Admitting: Internal Medicine

## 2014-08-27 ENCOUNTER — Encounter: Payer: Self-pay | Admitting: Internal Medicine

## 2014-08-27 ENCOUNTER — Ambulatory Visit (INDEPENDENT_AMBULATORY_CARE_PROVIDER_SITE_OTHER): Payer: BC Managed Care – PPO | Admitting: Internal Medicine

## 2014-08-27 VITALS — BP 120/88 | HR 63 | Ht 70.5 in | Wt 155.0 lb

## 2014-08-27 DIAGNOSIS — E038 Other specified hypothyroidism: Secondary | ICD-10-CM

## 2014-08-27 DIAGNOSIS — R202 Paresthesia of skin: Secondary | ICD-10-CM | POA: Insufficient documentation

## 2014-08-27 DIAGNOSIS — Z0000 Encounter for general adult medical examination without abnormal findings: Secondary | ICD-10-CM

## 2014-08-27 DIAGNOSIS — E034 Atrophy of thyroid (acquired): Secondary | ICD-10-CM | POA: Diagnosis not present

## 2014-08-27 NOTE — Progress Notes (Signed)
Pre visit review using our clinic review tool, if applicable. No additional management support is needed unless otherwise documented below in the visit note. 

## 2014-08-27 NOTE — Assessment & Plan Note (Signed)
We discussed age appropriate health related issues, including available/recomended screening tests and vaccinations. We discussed a need for adhering to healthy diet and exercise. Labs/EKG were reviewed/ordered. All questions were answered.   

## 2014-08-27 NOTE — Patient Instructions (Signed)

## 2014-08-27 NOTE — Progress Notes (Signed)
Subjective:   HPI  The patient is here for a wellness exam. The patient has been doing well overall without major physical or psychological issues going on lately. C/o feet burning at dance workshops C/o ED   Review of Systems  Constitutional: Negative for appetite change, fatigue and unexpected weight change.  HENT: Negative for congestion, nosebleeds, sneezing, sore throat and trouble swallowing.   Eyes: Negative for itching and visual disturbance.  Respiratory: Negative for cough.   Cardiovascular: Negative for chest pain, palpitations and leg swelling.  Gastrointestinal: Negative for nausea, diarrhea, blood in stool and abdominal distention.  Genitourinary: Negative for frequency and hematuria.  Musculoskeletal: Negative for back pain, joint swelling, gait problem and neck pain.  Skin: Negative for rash.  Neurological: Negative for dizziness, tremors, speech difficulty and weakness.  Psychiatric/Behavioral: Negative for sleep disturbance, dysphoric mood and agitation. The patient is not nervous/anxious.     Wt Readings from Last 3 Encounters:  08/27/14 155 lb (70.308 kg)  02/21/14 161 lb (73.029 kg)  12/14/13 160 lb (72.576 kg)   BP Readings from Last 3 Encounters:  08/27/14 120/88  02/21/14 104/78  12/14/13 132/94         Objective:   Physical Exam  Constitutional: He is oriented to person, place, and time. He appears well-developed and well-nourished. No distress.  HENT:  Head: Normocephalic and atraumatic.  Right Ear: External ear normal.  Left Ear: External ear normal.  Nose: Nose normal.  Mouth/Throat: Oropharynx is clear and moist. No oropharyngeal exudate.  Eyes: Conjunctivae and EOM are normal. Pupils are equal, round, and reactive to light. Right eye exhibits no discharge. Left eye exhibits no discharge. No scleral icterus.  Neck: Normal range of motion. Neck supple. No JVD present. No tracheal deviation present. No thyromegaly present.  Cardiovascular:  Normal rate, regular rhythm, normal heart sounds and intact distal pulses.  Exam reveals no gallop and no friction rub.   No murmur heard. Pulmonary/Chest: Effort normal and breath sounds normal. No stridor. No respiratory distress. He has no wheezes. He has no rales. He exhibits no tenderness.  Abdominal: Soft. Bowel sounds are normal. He exhibits no distension and no mass. There is no tenderness. There is no rebound and no guarding.  Genitourinary: Rectum normal, prostate normal and penis normal. Guaiac negative stool. No penile tenderness.  Musculoskeletal: Normal range of motion. He exhibits no edema or tenderness.  Lymphadenopathy:    He has no cervical adenopathy.  Neurological: He is alert and oriented to person, place, and time. He has normal reflexes. No cranial nerve deficit. He exhibits normal muscle tone. Coordination normal.  Skin: Skin is warm and dry. No rash noted. He is not diaphoretic. No erythema. No pallor.  Psychiatric: He has a normal mood and affect. His behavior is normal. Judgment and thought content normal.  G(-)   Lab Results  Component Value Date   WBC 5.1 08/27/2013   HGB 15.2 08/27/2013   HCT 44.7 08/27/2013   PLT 243.0 08/27/2013   GLUCOSE 94 02/21/2014   CHOL 192 01/19/2013   TRIG 25.0 01/19/2013   HDL 62.20 01/19/2013   LDLDIRECT 117.3 01/21/2011   LDLCALC 125* 01/19/2013   ALT 20 08/27/2013   AST 31 08/27/2013   NA 140 02/21/2014   K 5.0 02/21/2014   CL 103 02/21/2014   CREATININE 0.7 02/21/2014   BUN 11 02/21/2014   CO2 27 02/21/2014   TSH 2.42 02/21/2014   PSA 0.91 01/19/2013  Assessment & Plan:

## 2014-08-27 NOTE — Assessment & Plan Note (Addendum)
Labs On Rx 

## 2014-08-28 ENCOUNTER — Other Ambulatory Visit (INDEPENDENT_AMBULATORY_CARE_PROVIDER_SITE_OTHER): Payer: BC Managed Care – PPO

## 2014-08-28 DIAGNOSIS — E038 Other specified hypothyroidism: Secondary | ICD-10-CM

## 2014-08-28 DIAGNOSIS — Z0000 Encounter for general adult medical examination without abnormal findings: Secondary | ICD-10-CM

## 2014-08-28 DIAGNOSIS — E034 Atrophy of thyroid (acquired): Secondary | ICD-10-CM

## 2014-08-28 DIAGNOSIS — R202 Paresthesia of skin: Secondary | ICD-10-CM | POA: Diagnosis not present

## 2014-08-28 LAB — URINALYSIS, ROUTINE W REFLEX MICROSCOPIC
Bilirubin Urine: NEGATIVE
Hgb urine dipstick: NEGATIVE
KETONES UR: NEGATIVE
Nitrite: NEGATIVE
RBC / HPF: NONE SEEN (ref 0–?)
SPECIFIC GRAVITY, URINE: 1.015 (ref 1.000–1.030)
Total Protein, Urine: NEGATIVE
URINE GLUCOSE: NEGATIVE
UROBILINOGEN UA: 0.2 (ref 0.0–1.0)
pH: 7 (ref 5.0–8.0)

## 2014-08-28 LAB — CBC WITH DIFFERENTIAL/PLATELET
Basophils Absolute: 0 10*3/uL (ref 0.0–0.1)
Basophils Relative: 1 % (ref 0.0–3.0)
EOS PCT: 3.6 % (ref 0.0–5.0)
Eosinophils Absolute: 0.1 10*3/uL (ref 0.0–0.7)
HCT: 41.4 % (ref 39.0–52.0)
Hemoglobin: 14.3 g/dL (ref 13.0–17.0)
Lymphocytes Relative: 29.5 % (ref 12.0–46.0)
Lymphs Abs: 1.1 10*3/uL (ref 0.7–4.0)
MCHC: 34.5 g/dL (ref 30.0–36.0)
MCV: 97.4 fl (ref 78.0–100.0)
MONOS PCT: 8 % (ref 3.0–12.0)
Monocytes Absolute: 0.3 10*3/uL (ref 0.1–1.0)
NEUTROS PCT: 57.9 % (ref 43.0–77.0)
Neutro Abs: 2.2 10*3/uL (ref 1.4–7.7)
PLATELETS: 250 10*3/uL (ref 150.0–400.0)
RBC: 4.26 Mil/uL (ref 4.22–5.81)
RDW: 12.9 % (ref 11.5–15.5)
WBC: 3.7 10*3/uL — AB (ref 4.0–10.5)

## 2014-08-28 LAB — TSH: TSH: 2.42 u[IU]/mL (ref 0.35–4.50)

## 2014-09-10 ENCOUNTER — Other Ambulatory Visit: Payer: BC Managed Care – PPO

## 2014-09-10 LAB — LIPID PANEL
CHOLESTEROL: 173 mg/dL (ref 0–200)
HDL: 60.1 mg/dL (ref >39.00)
LDL CALC: 104 mg/dL — AB (ref 0–99)
NonHDL: 112.9
Total CHOL/HDL Ratio: 3
Triglycerides: 45 mg/dL (ref 0.0–149.0)
VLDL: 9 mg/dL (ref 0.0–40.0)

## 2014-09-10 LAB — PSA: PSA: 0.87 ng/mL (ref 0.10–4.00)

## 2014-09-10 LAB — BASIC METABOLIC PANEL
BUN: 10 mg/dL (ref 6–23)
CALCIUM: 9.4 mg/dL (ref 8.4–10.5)
CHLORIDE: 102 meq/L (ref 96–112)
CO2: 30 mEq/L (ref 19–32)
CREATININE: 0.72 mg/dL (ref 0.40–1.50)
GFR: 116.56 mL/min (ref >60.00)
Glucose, Bld: 96 mg/dL (ref 70–99)
Potassium: 4.4 mEq/L (ref 3.5–5.1)
Sodium: 140 mEq/L (ref 135–145)

## 2014-09-10 LAB — HEPATIC FUNCTION PANEL
ALT: 19 U/L (ref 0–53)
AST: 28 U/L (ref 0–37)
Albumin: 3.7 g/dL (ref 3.5–5.2)
Alkaline Phosphatase: 61 U/L (ref 39–117)
BILIRUBIN DIRECT: 0.1 mg/dL (ref 0.0–0.3)
TOTAL PROTEIN: 7 g/dL (ref 6.0–8.3)
Total Bilirubin: 0.4 mg/dL (ref 0.2–1.2)

## 2014-09-10 LAB — VITAMIN B12: VITAMIN B 12: 993 pg/mL — AB (ref 211–911)

## 2014-09-10 LAB — TESTOSTERONE: TESTOSTERONE: 389.88 ng/dL (ref 300.00–890.00)

## 2015-01-10 ENCOUNTER — Other Ambulatory Visit: Payer: Self-pay | Admitting: Internal Medicine

## 2015-02-19 ENCOUNTER — Encounter: Payer: Self-pay | Admitting: Internal Medicine

## 2015-02-19 ENCOUNTER — Ambulatory Visit (INDEPENDENT_AMBULATORY_CARE_PROVIDER_SITE_OTHER): Payer: Medicare Other | Admitting: Internal Medicine

## 2015-02-19 VITALS — BP 130/90 | HR 56 | Wt 153.0 lb

## 2015-02-19 DIAGNOSIS — E034 Atrophy of thyroid (acquired): Secondary | ICD-10-CM

## 2015-02-19 DIAGNOSIS — F39 Unspecified mood [affective] disorder: Secondary | ICD-10-CM

## 2015-02-19 DIAGNOSIS — F338 Other recurrent depressive disorders: Secondary | ICD-10-CM

## 2015-02-19 DIAGNOSIS — R202 Paresthesia of skin: Secondary | ICD-10-CM

## 2015-02-19 DIAGNOSIS — R413 Other amnesia: Secondary | ICD-10-CM | POA: Diagnosis not present

## 2015-02-19 DIAGNOSIS — E038 Other specified hypothyroidism: Secondary | ICD-10-CM

## 2015-02-19 DIAGNOSIS — Z23 Encounter for immunization: Secondary | ICD-10-CM | POA: Diagnosis not present

## 2015-02-19 MED ORDER — TADALAFIL 5 MG PO TABS: 5.0000 mg | ORAL_TABLET | Freq: Every day | ORAL | Status: DC | PRN

## 2015-02-19 MED ORDER — LEVOTHYROXINE SODIUM 25 MCG PO TABS: 25.0000 ug | ORAL_TABLET | Freq: Every day | ORAL | Status: DC

## 2015-02-19 NOTE — Assessment & Plan Note (Signed)
Recurrent Not on Rx Discussed

## 2015-02-19 NOTE — Progress Notes (Signed)
Pre visit review using our clinic review tool, if applicable. No additional management support is needed unless otherwise documented below in the visit note. 

## 2015-02-19 NOTE — Patient Instructions (Addendum)
Your feet issue is likely to be a overuse neuropathy Use blend fiber socks

## 2015-02-19 NOTE — Assessment & Plan Note (Signed)
Doing well 

## 2015-02-19 NOTE — Assessment & Plan Note (Signed)
B feet - overuse neuropathy

## 2015-02-19 NOTE — Assessment & Plan Note (Signed)
On Levothroid Chronic

## 2015-02-19 NOTE — Progress Notes (Signed)
Subjective:  Patient ID: Travis Morse, male    DOB: 1950/03/30  Age: 64 y.o. MRN: [id number]  CC: No chief complaint on file.   HPI TEX HEIMER presents for hypothyroidism, BPH f/u. C/o seasonal depressive sx's  Outpatient Prescriptions Prior to Visit  Medication Sig Dispense Refill  . Beet Root POWD Take 6 oz by mouth 3 (three) times daily.    . cholecalciferol (VITAMIN D) 1000 UNITS tablet Take 1,000 Units by mouth daily.      . Cyanocobalamin (VITAMIN B-12) 500 MCG SUBL 1 tab sl qd 100 tablet 3  . fish oil-omega-3 fatty acids 1000 MG capsule Take 2 g by mouth daily.      Marland Kitchen CIALIS 5 MG tablet TAKE ONE TABLET BY MOUTH ONCE DAILY AS NEEDED FOR  ERECTILE  DYSFUNCTION 30 tablet 1  . levothyroxine (SYNTHROID, LEVOTHROID) 25 MCG tablet Take 1 tablet (25 mcg total) by mouth daily. 90 tablet 2  . donepezil (ARICEPT) 10 MG tablet TAKE ONE TABLET BY MOUTH AT BEDTIME (Patient not taking: Reported on 02/19/2015) 30 tablet 5  . nitroGLYCERIN (NITRODUR - DOSED IN MG/24 HR) 0.2 mg/hr 1/4 patch to ankle daily for painjscript:void(0) (Patient not taking: Reported on 02/19/2015) 30 patch 1   No facility-administered medications prior to visit.    ROS Review of Systems  Constitutional: Negative for appetite change, fatigue and unexpected weight change.  HENT: Negative for congestion, nosebleeds, sneezing, sore throat and trouble swallowing.   Eyes: Negative for itching and visual disturbance.  Respiratory: Negative for cough.   Cardiovascular: Negative for chest pain, palpitations and leg swelling.  Gastrointestinal: Negative for nausea, diarrhea, blood in stool and abdominal distention.  Genitourinary: Negative for frequency and hematuria.  Musculoskeletal: Negative for back pain, joint swelling, gait problem and neck pain.  Skin: Negative for rash.  Neurological: Negative for dizziness, tremors, speech difficulty and weakness.  Psychiatric/Behavioral: Negative for sleep disturbance,  dysphoric mood and agitation. The patient is not nervous/anxious.     Objective:  BP 130/90 mmHg  Pulse 56  Wt 153 lb (69.4 kg)  SpO2 98%  BP Readings from Last 3 Encounters:  02/19/15 130/90  08/27/14 120/88  02/21/14 104/78    Wt Readings from Last 3 Encounters:  02/19/15 153 lb (69.4 kg)  08/27/14 155 lb (70.308 kg)  02/21/14 161 lb (73.029 kg)    Physical Exam  Constitutional: He is oriented to person, place, and time. He appears well-developed and well-nourished. No distress.  HENT:  Head: Normocephalic and atraumatic.  Right Ear: External ear normal.  Left Ear: External ear normal.  Nose: Nose normal.  Mouth/Throat: Oropharynx is clear and moist. No oropharyngeal exudate.  Eyes: Conjunctivae and EOM are normal. Pupils are equal, round, and reactive to light. Right eye exhibits no discharge. Left eye exhibits no discharge. No scleral icterus.  Neck: Normal range of motion. Neck supple. No JVD present. No tracheal deviation present. No thyromegaly present.  Cardiovascular: Normal rate, regular rhythm, normal heart sounds and intact distal pulses.  Exam reveals no gallop and no friction rub.   No murmur heard. Pulmonary/Chest: Effort normal and breath sounds normal. No stridor. No respiratory distress. He has no wheezes. He has no rales. He exhibits no tenderness.  Abdominal: Soft. Bowel sounds are normal. He exhibits no distension and no mass. There is no tenderness. There is no rebound and no guarding.  Genitourinary: Rectum normal, prostate normal and penis normal. Guaiac negative stool. No penile tenderness.  Musculoskeletal: Normal range  of motion. He exhibits no edema or tenderness.  Lymphadenopathy:    He has no cervical adenopathy.  Neurological: He is alert and oriented to person, place, and time. He has normal reflexes. No cranial nerve deficit. He exhibits normal muscle tone. Coordination normal.  Skin: Skin is warm and dry. No rash noted. He is not diaphoretic.  No erythema. No pallor.  Psychiatric: He has a normal mood and affect. His behavior is normal. Judgment and thought content normal.    Lab Results  Component Value Date   WBC 3.7* 08/28/2014   HGB 14.3 08/28/2014   HCT 41.4 08/28/2014   PLT 250.0 08/28/2014   GLUCOSE 96 08/28/2014   CHOL 173 08/28/2014   TRIG 45.0 08/28/2014   HDL 60.10 08/28/2014   LDLDIRECT 117.3 01/21/2011   LDLCALC 104* 08/28/2014   ALT 19 08/28/2014   AST 28 08/28/2014   NA 140 08/28/2014   K 4.4 08/28/2014   CL 102 08/28/2014   CREATININE 0.72 08/28/2014   BUN 10 08/28/2014   CO2 30 08/28/2014   TSH 2.42 08/28/2014   PSA 0.87 08/28/2014    US Soft Tissue Head/neck  02/17/2012  The *RADIOLOGY REPORT* Clinical Data: Follow-up thyroid nodule. THYROID ULTRASOUND Technique: Ultrasound examination of the thyroid gland and adjacent soft tissues was performed. Comparison:  02/20/2009 Findings: Right thyroid lobe:  3.9 x 1.0 x 1.6 cm. Left thyroid lobe:  4.8 x 1.5 x 1.5 cm. Isthmus:  2 mm. Focal nodules:  Focal left lower pole solid nodule again noted. This measures 2.2 x 1.5 x 1.5 cm compared 1.9 x 1.4 x 1.2 cm previously.  No new nodules. Lymphadenopathy:  None visualized. Small scattered cervical lymph nodes, none pathologically enlarged based on imaging size criteria. IMPRESSION: Solid left lower pole nodule currently measures up to 2.2 cm compared 1.9 cm previously. Original Report Authenticated By: Rolm Baptise, M.D.    Assessment & Plan:   Diagnoses and all orders for this visit:  Memory loss  Hypothyroidism due to acquired atrophy of thyroid  Seasonal affective disorder ([place])  Paresthesia  Need for influenza vaccination -     Flu Vaccine QUAD 36+ mos IM  Need for prophylactic vaccination against Streptococcus pneumoniae (pneumococcus) -     Pneumococcal polysaccharide vaccine 23-valent greater than or equal to 2yo subcutaneous/IM  Other orders -     Discontinue: tadalafil (CIALIS) 5 MG tablet;  Take 1 tablet (5 mg total) by mouth daily as needed for erectile dysfunction. -     levothyroxine (SYNTHROID, LEVOTHROID) 25 MCG tablet; Take 1 tablet (25 mcg total) by mouth daily. -     tadalafil (CIALIS) 5 MG tablet; Take 1 tablet (5 mg total) by mouth daily as needed for erectile dysfunction.   I have discontinued Mr. Whack CIALIS. I am also having him maintain his fish oil-omega-3 fatty acids, cholecalciferol, Vitamin B-12, nitroGLYCERIN, Beet Root, donepezil, levothyroxine, and tadalafil.  Meds ordered this encounter  Medications  . DISCONTD: tadalafil (CIALIS) 5 MG tablet    Sig: Take 1 tablet (5 mg total) by mouth daily as needed for erectile dysfunction.    Dispense:  30 tablet    Refill:  3  . levothyroxine (SYNTHROID, LEVOTHROID) 25 MCG tablet    Sig: Take 1 tablet (25 mcg total) by mouth daily.    Dispense:  90 tablet    Refill:  3  . tadalafil (CIALIS) 5 MG tablet    Sig: Take 1 tablet (5 mg total) by mouth daily as  needed for erectile dysfunction.    Dispense:  90 tablet    Refill:  3     Follow-up: Return in about 6 months (around 08/20/2015) for Wellness Exam.  Walker Kehr, MD

## 2015-04-15 ENCOUNTER — Encounter: Payer: Self-pay | Admitting: Internal Medicine

## 2015-08-20 ENCOUNTER — Ambulatory Visit (INDEPENDENT_AMBULATORY_CARE_PROVIDER_SITE_OTHER): Payer: Medicare Other | Admitting: Internal Medicine

## 2015-08-20 ENCOUNTER — Encounter: Payer: Self-pay | Admitting: Internal Medicine

## 2015-08-20 VITALS — BP 120/86 | HR 57 | Wt 152.0 lb

## 2015-08-20 DIAGNOSIS — E038 Other specified hypothyroidism: Secondary | ICD-10-CM

## 2015-08-20 DIAGNOSIS — R413 Other amnesia: Secondary | ICD-10-CM

## 2015-08-20 DIAGNOSIS — N529 Male erectile dysfunction, unspecified: Secondary | ICD-10-CM | POA: Diagnosis not present

## 2015-08-20 DIAGNOSIS — E034 Atrophy of thyroid (acquired): Secondary | ICD-10-CM

## 2015-08-20 DIAGNOSIS — Z0000 Encounter for general adult medical examination without abnormal findings: Secondary | ICD-10-CM | POA: Diagnosis not present

## 2015-08-20 MED ORDER — TADALAFIL 10 MG PO TABS: 10.0000 mg | ORAL_TABLET | Freq: Every day | ORAL | Status: DC | PRN

## 2015-08-20 NOTE — Progress Notes (Signed)
Pre visit review using our clinic review tool, if applicable. No additional management support is needed unless otherwise documented below in the visit note. 

## 2015-08-20 NOTE — Progress Notes (Signed)
Subjective:  Patient ID: Travis Morse, male    DOB: 1950-08-27  Age: 65 y.o. MRN: [id number]  CC: No chief complaint on file.   HPI Travis Morse presents for a hypothyroidism, ED, memory issues f/u. Not depressed.  Outpatient Prescriptions Prior to Visit  Medication Sig Dispense Refill  . Beet Root POWD Take 6 oz by mouth 3 (three) times daily.    . cholecalciferol (VITAMIN D) 1000 UNITS tablet Take 1,000 Units by mouth daily.      . Cyanocobalamin (VITAMIN B-12) 500 MCG SUBL 1 tab sl qd 100 tablet 3  . fish oil-omega-3 fatty acids 1000 MG capsule Take 4 g by mouth daily.     Marland Kitchen levothyroxine (SYNTHROID, LEVOTHROID) 25 MCG tablet Take 1 tablet (25 mcg total) by mouth daily. 90 tablet 3  . tadalafil (CIALIS) 5 MG tablet Take 1 tablet (5 mg total) by mouth daily as needed for erectile dysfunction. 90 tablet 3  . donepezil (ARICEPT) 10 MG tablet TAKE ONE TABLET BY MOUTH AT BEDTIME (Patient not taking: Reported on 08/20/2015) 30 tablet 5  . nitroGLYCERIN (NITRODUR - DOSED IN MG/24 HR) 0.2 mg/hr 1/4 patch to ankle daily for painjscript:void(0) (Patient not taking: Reported on 08/20/2015) 30 patch 1   No facility-administered medications prior to visit.    ROS Review of Systems  Constitutional: Negative for appetite change, fatigue and unexpected weight change.  HENT: Negative for congestion, nosebleeds, sneezing, sore throat and trouble swallowing.   Eyes: Negative for itching and visual disturbance.  Respiratory: Negative for cough.   Cardiovascular: Negative for chest pain, palpitations and leg swelling.  Gastrointestinal: Negative for nausea, diarrhea, blood in stool and abdominal distention.  Genitourinary: Negative for frequency and hematuria.  Musculoskeletal: Negative for back pain, joint swelling, gait problem and neck pain.  Skin: Negative for rash.  Neurological: Negative for dizziness, tremors, speech difficulty and weakness.  Psychiatric/Behavioral: Negative for  sleep disturbance, dysphoric mood and agitation. The patient is not nervous/anxious.     Objective:  BP 120/86 mmHg  Pulse 57  Wt 152 lb (68.947 kg)  SpO2 97%  BP Readings from Last 3 Encounters:  08/20/15 120/86  02/19/15 130/90  08/27/14 120/88    Wt Readings from Last 3 Encounters:  08/20/15 152 lb (68.947 kg)  02/19/15 153 lb (69.4 kg)  08/27/14 155 lb (70.308 kg)    Physical Exam  Constitutional: He is oriented to person, place, and time. He appears well-developed. No distress.  NAD  HENT:  Mouth/Throat: Oropharynx is clear and moist.  Eyes: Conjunctivae are normal. Pupils are equal, round, and reactive to light.  Neck: Normal range of motion. No JVD present. No thyromegaly present.  Cardiovascular: Normal rate, regular rhythm, normal heart sounds and intact distal pulses.  Exam reveals no gallop and no friction rub.   No murmur heard. Pulmonary/Chest: Effort normal and breath sounds normal. No respiratory distress. He has no wheezes. He has no rales. He exhibits no tenderness.  Abdominal: Soft. Bowel sounds are normal. He exhibits no distension and no mass. There is no tenderness. There is no rebound and no guarding.  Musculoskeletal: Normal range of motion. He exhibits no edema or tenderness.  Lymphadenopathy:    He has no cervical adenopathy.  Neurological: He is alert and oriented to person, place, and time. He has normal reflexes. No cranial nerve deficit. He exhibits normal muscle tone. He displays a negative Romberg sign. Coordination and gait normal.  Skin: Skin is warm and dry. No  rash noted.  Psychiatric: He has a normal mood and affect. His behavior is normal. Judgment and thought content normal.    Lab Results  Component Value Date   WBC 3.7* 08/28/2014   HGB 14.3 08/28/2014   HCT 41.4 08/28/2014   PLT 250.0 08/28/2014   GLUCOSE 96 08/28/2014   CHOL 173 08/28/2014   TRIG 45.0 08/28/2014   HDL 60.10 08/28/2014   LDLDIRECT 117.3 01/21/2011   LDLCALC  104* 08/28/2014   ALT 19 08/28/2014   AST 28 08/28/2014   NA 140 08/28/2014   K 4.4 08/28/2014   CL 102 08/28/2014   CREATININE 0.72 08/28/2014   BUN 10 08/28/2014   CO2 30 08/28/2014   TSH 2.42 08/28/2014   PSA 0.87 08/28/2014    US Soft Tissue Head/neck  02/17/2012  The *RADIOLOGY REPORT* Clinical Data: Follow-up thyroid nodule. THYROID ULTRASOUND Technique: Ultrasound examination of the thyroid gland and adjacent soft tissues was performed. Comparison:  02/20/2009 Findings: Right thyroid lobe:  3.9 x 1.0 x 1.6 cm. Left thyroid lobe:  4.8 x 1.5 x 1.5 cm. Isthmus:  2 mm. Focal nodules:  Focal left lower pole solid nodule again noted. This measures 2.2 x 1.5 x 1.5 cm compared 1.9 x 1.4 x 1.2 cm previously.  No new nodules. Lymphadenopathy:  None visualized. Small scattered cervical lymph nodes, none pathologically enlarged based on imaging size criteria. IMPRESSION: Solid left lower pole nodule currently measures up to 2.2 cm compared 1.9 cm previously. Original Report Authenticated By: Rolm Baptise, M.D.    Assessment & Plan:   There are no diagnoses linked to this encounter. I am having [name] maintain his fish oil-omega-3 fatty acids, cholecalciferol, Vitamin B-12, nitroGLYCERIN, Beet Root, donepezil, levothyroxine, tadalafil, and Misc Natural Products (TUMERSAID PO).  Meds ordered this encounter  Medications  . Misc Natural Products (TUMERSAID PO)    Sig: Take by mouth 2 (two) times daily.     Follow-up: No Follow-up on file.  Walker Kehr, MD

## 2015-08-21 ENCOUNTER — Encounter: Payer: Self-pay | Admitting: Internal Medicine

## 2015-08-21 NOTE — Assessment & Plan Note (Addendum)
Not controlled Will d/c cialis 5 mg/d Start Cialis 10 mg 1-2 tabs prn

## 2015-08-21 NOTE — Assessment & Plan Note (Signed)
On Levothroid Labs 

## 2015-08-21 NOTE — Assessment & Plan Note (Signed)
Doing well on Aricept 

## 2015-09-16 ENCOUNTER — Other Ambulatory Visit (INDEPENDENT_AMBULATORY_CARE_PROVIDER_SITE_OTHER): Payer: Medicare Other

## 2015-09-16 DIAGNOSIS — Z0000 Encounter for general adult medical examination without abnormal findings: Secondary | ICD-10-CM | POA: Diagnosis not present

## 2015-09-16 LAB — CBC WITH DIFFERENTIAL/PLATELET
BASOS PCT: 1 % (ref 0.0–3.0)
Basophils Absolute: 0.1 10*3/uL (ref 0.0–0.1)
EOS PCT: 3.5 % (ref 0.0–5.0)
Eosinophils Absolute: 0.2 10*3/uL (ref 0.0–0.7)
HEMATOCRIT: 42.1 % (ref 39.0–52.0)
HEMOGLOBIN: 14.4 g/dL (ref 13.0–17.0)
LYMPHS PCT: 32.8 % (ref 12.0–46.0)
Lymphs Abs: 1.6 10*3/uL (ref 0.7–4.0)
MCHC: 34.2 g/dL (ref 30.0–36.0)
MCV: 98.2 fl (ref 78.0–100.0)
Monocytes Absolute: 0.4 10*3/uL (ref 0.1–1.0)
Monocytes Relative: 8.9 % (ref 3.0–12.0)
Neutro Abs: 2.6 10*3/uL (ref 1.4–7.7)
Neutrophils Relative %: 53.8 % (ref 43.0–77.0)
Platelets: 222 10*3/uL (ref 150.0–400.0)
RBC: 4.29 Mil/uL (ref 4.22–5.81)
RDW: 12.7 % (ref 11.5–15.5)
WBC: 4.9 10*3/uL (ref 4.0–10.5)

## 2015-09-16 LAB — URINALYSIS
Hgb urine dipstick: NEGATIVE
KETONES UR: NEGATIVE
LEUKOCYTES UA: NEGATIVE
NITRITE: NEGATIVE
Specific Gravity, Urine: >=1.03 — AB (ref 1.000–1.030)
TOTAL PROTEIN, URINE-UPE24: NEGATIVE
Urine Glucose: NEGATIVE
Urobilinogen, UA: 0.2 (ref 0.0–1.0)
pH: 5 (ref 5.0–8.0)

## 2015-09-16 LAB — HEPATIC FUNCTION PANEL
ALBUMIN: 4.3 g/dL (ref 3.5–5.2)
ALK PHOS: 59 U/L (ref 39–117)
ALT: 16 U/L (ref 0–53)
AST: 29 U/L (ref 0–37)
BILIRUBIN TOTAL: 0.5 mg/dL (ref 0.2–1.2)
Bilirubin, Direct: 0.1 mg/dL (ref 0.0–0.3)
Total Protein: 7.1 g/dL (ref 6.0–8.3)

## 2015-09-16 LAB — BASIC METABOLIC PANEL
BUN: 12 mg/dL (ref 6–23)
CHLORIDE: 103 meq/L (ref 96–112)
CO2: 31 mEq/L (ref 19–32)
Calcium: 9.7 mg/dL (ref 8.4–10.5)
Creatinine, Ser: 0.8 mg/dL (ref 0.40–1.50)
GFR: 102.89 mL/min (ref >60.00)
Glucose, Bld: 103 mg/dL — ABNORMAL HIGH (ref 70–99)
POTASSIUM: 4.9 meq/L (ref 3.5–5.1)
SODIUM: 140 meq/L (ref 135–145)

## 2015-09-16 LAB — LIPID PANEL
CHOL/HDL RATIO: 3
Cholesterol: 168 mg/dL (ref 0–200)
HDL: 57.2 mg/dL (ref >39.00)
LDL CALC: 102 mg/dL — AB (ref 0–99)
NONHDL: 110.52
TRIGLYCERIDES: 42 mg/dL (ref 0.0–149.0)
VLDL: 8.4 mg/dL (ref 0.0–40.0)

## 2015-09-16 LAB — VITAMIN B12: Vitamin B-12: 690 pg/mL (ref 211–911)

## 2015-09-16 LAB — PSA: PSA: 0.79 ng/mL (ref 0.10–4.00)

## 2015-09-16 LAB — TSH: TSH: 2.46 u[IU]/mL (ref 0.35–4.50)

## 2015-09-17 LAB — HEPATITIS C ANTIBODY: HCV AB: NEGATIVE

## 2015-09-19 ENCOUNTER — Encounter: Payer: Self-pay | Admitting: Internal Medicine

## 2015-09-19 ENCOUNTER — Ambulatory Visit (INDEPENDENT_AMBULATORY_CARE_PROVIDER_SITE_OTHER): Payer: Medicare Other | Admitting: Internal Medicine

## 2015-09-19 VITALS — BP 128/88 | HR 61 | Ht 70.5 in | Wt 153.0 lb

## 2015-09-19 DIAGNOSIS — Z23 Encounter for immunization: Secondary | ICD-10-CM

## 2015-09-19 DIAGNOSIS — Z0000 Encounter for general adult medical examination without abnormal findings: Secondary | ICD-10-CM

## 2015-09-19 MED ORDER — TADALAFIL 5 MG PO TABS: 5.0000 mg | ORAL_TABLET | Freq: Every day | ORAL | Status: DC | PRN

## 2015-09-19 MED ORDER — LEVOTHYROXINE SODIUM 25 MCG PO TABS: 25.0000 ug | ORAL_TABLET | Freq: Every day | ORAL | Status: DC

## 2015-09-19 NOTE — Progress Notes (Signed)
Subjective:  Patient ID: Travis Morse, male    DOB: 18-May-1950  Age: 65 y.o. MRN: [id number]  CC: No chief complaint on file.   HPI AHMARION GOGOL presents for a well exam. F/u ED - needs Cialis 5 mg due to cost   Outpatient Prescriptions Prior to Visit  Medication Sig Dispense Refill  . Beet Root POWD Take 6 oz by mouth 3 (three) times daily.    . cholecalciferol (VITAMIN D) 1000 UNITS tablet Take 1,000 Units by mouth daily.      . Cyanocobalamin (VITAMIN B-12) 500 MCG SUBL 1 tab sl qd 100 tablet 3  . fish oil-omega-3 fatty acids 1000 MG capsule Take 4 g by mouth daily.     Marland Kitchen levothyroxine (SYNTHROID, LEVOTHROID) 25 MCG tablet Take 1 tablet (25 mcg total) by mouth daily. 90 tablet 3  . Misc Natural Products (TUMERSAID PO) Take by mouth 2 (two) times daily.    . tadalafil (CIALIS) 10 MG tablet Take 1 tablet (10 mg total) by mouth daily as needed for erectile dysfunction. (Patient not taking: Reported on 09/19/2015) 30 tablet 3   No facility-administered medications prior to visit.    ROS Review of Systems  Constitutional: Negative for appetite change, fatigue and unexpected weight change.  HENT: Negative for congestion, nosebleeds, sneezing, sore throat and trouble swallowing.   Eyes: Negative for itching and visual disturbance.  Respiratory: Negative for cough.   Cardiovascular: Negative for chest pain, palpitations and leg swelling.  Gastrointestinal: Negative for nausea, diarrhea, blood in stool and abdominal distention.  Genitourinary: Negative for frequency and hematuria.  Musculoskeletal: Negative for back pain, joint swelling, gait problem and neck pain.  Skin: Negative for rash.  Neurological: Negative for dizziness, tremors, speech difficulty and weakness.  Psychiatric/Behavioral: Positive for decreased concentration. Negative for suicidal ideas, sleep disturbance, dysphoric mood and agitation. The patient is not nervous/anxious.     Objective:  BP 128/88  mmHg  Pulse 61  Ht 5' 10.5" (1.791 m)  Wt 153 lb (69.4 kg)  BMI 21.64 kg/m2  SpO2 97%  BP Readings from Last 3 Encounters:  09/19/15 128/88  08/20/15 120/86  02/19/15 130/90    Wt Readings from Last 3 Encounters:  09/19/15 153 lb (69.4 kg)  08/20/15 152 lb (68.947 kg)  02/19/15 153 lb (69.4 kg)    Physical Exam  Constitutional: He is oriented to person, place, and time. He appears well-developed and well-nourished. No distress.  HENT:  Head: Normocephalic and atraumatic.  Right Ear: External ear normal.  Left Ear: External ear normal.  Nose: Nose normal.  Mouth/Throat: Oropharynx is clear and moist. No oropharyngeal exudate.  Eyes: Conjunctivae and EOM are normal. Pupils are equal, round, and reactive to light. Right eye exhibits no discharge. Left eye exhibits no discharge. No scleral icterus.  Neck: Normal range of motion. Neck supple. No JVD present. No tracheal deviation present. No thyromegaly present.  Cardiovascular: Normal rate, regular rhythm, normal heart sounds and intact distal pulses.  Exam reveals no gallop and no friction rub.   No murmur heard. Pulmonary/Chest: Effort normal and breath sounds normal. No stridor. No respiratory distress. He has no wheezes. He has no rales. He exhibits no tenderness.  Abdominal: Soft. Bowel sounds are normal. He exhibits no distension and no mass. There is no tenderness. There is no rebound and no guarding.  Genitourinary: Rectum normal, prostate normal and penis normal. Guaiac negative stool. No penile tenderness.  Musculoskeletal: Normal range of motion. He exhibits no  edema or tenderness.  Lymphadenopathy:    He has no cervical adenopathy.  Neurological: He is alert and oriented to person, place, and time. He has normal reflexes. No cranial nerve deficit. He exhibits normal muscle tone. Coordination normal.  Skin: Skin is warm and dry. No rash noted. He is not diaphoretic. No erythema. No pallor.  Psychiatric: He has a normal  mood and affect. His behavior is normal. Judgment and thought content normal.    Lab Results  Component Value Date   WBC 4.9 09/16/2015   HGB 14.4 09/16/2015   HCT 42.1 09/16/2015   PLT 222.0 09/16/2015   GLUCOSE 103* 09/16/2015   CHOL 168 09/16/2015   TRIG 42.0 09/16/2015   HDL 57.20 09/16/2015   LDLDIRECT 117.3 01/21/2011   LDLCALC 102* 09/16/2015   ALT 16 09/16/2015   AST 29 09/16/2015   NA 140 09/16/2015   K 4.9 09/16/2015   CL 103 09/16/2015   CREATININE 0.80 09/16/2015   BUN 12 09/16/2015   CO2 31 09/16/2015   TSH 2.46 09/16/2015   PSA 0.79 09/16/2015    US Soft Tissue Head/neck  02/17/2012  The *RADIOLOGY REPORT* Clinical Data: Follow-up thyroid nodule. THYROID ULTRASOUND Technique: Ultrasound examination of the thyroid gland and adjacent soft tissues was performed. Comparison:  02/20/2009 Findings: Right thyroid lobe:  3.9 x 1.0 x 1.6 cm. Left thyroid lobe:  4.8 x 1.5 x 1.5 cm. Isthmus:  2 mm. Focal nodules:  Focal left lower pole solid nodule again noted. This measures 2.2 x 1.5 x 1.5 cm compared 1.9 x 1.4 x 1.2 cm previously.  No new nodules. Lymphadenopathy:  None visualized. Small scattered cervical lymph nodes, none pathologically enlarged based on imaging size criteria. IMPRESSION: Solid left lower pole nodule currently measures up to 2.2 cm compared 1.9 cm previously. Original Report Authenticated By: Rolm Baptise, M.D.    Assessment & Plan:   There are no diagnoses linked to this encounter. I am having Mr. Riedell maintain his fish oil-omega-3 fatty acids, cholecalciferol, Vitamin B-12, Beet Root, levothyroxine, Misc Natural Products (TUMERSAID PO), and tadalafil.  No orders of the defined types were placed in this encounter.     Follow-up: No Follow-up on file.  Walker Kehr, MD

## 2015-09-19 NOTE — Assessment & Plan Note (Addendum)
Here for medicare wellness/physical  Diet: heart healthy  Physical activity: not sedentary  Depression/mood screen: negative  Hearing: intact to whispered voice  Visual acuity: grossly normal, performs annual eye exam  ADLs: capable  Fall risk: low to none  Home safety: good  Cognitive evaluation: intact to orientation, naming, recall and repetition  EOL planning: adv directives, full code/ I agree  I have personally reviewed and have noted  1. The patient's medical, surgical and social history  2. Their use of alcohol, tobacco or illicit drugs  3. Their current medications and supplements  4. The patient's functional ability including ADL's, fall risks, home safety risks and hearing or visual impairment.  5. Diet and physical activities  6. Evidence for depression or mood disorders 7. The roster of all physicians providing medical care to patient - is listed in the Snapshot section of the chart and reviewed today.    Today patient counseled on age appropriate routine health concerns for screening and prevention, each reviewed and up to date or declined. Immunizations reviewed and up to date or declined. Labs ordered and reviewed. Risk factors for depression reviewed and negative. Hearing function and visual acuity are intact. ADLs screened and addressed as needed. Functional ability and level of safety reviewed and appropriate. Education, counseling and referrals performed based on assessed risks today. Patient provided with a copy of personalized plan for preventive services.   Colon was due in 2016 - he is hesitant Cologuard info

## 2015-09-19 NOTE — Patient Instructions (Signed)

## 2015-09-19 NOTE — Progress Notes (Signed)
Pre visit review using our clinic review tool, if applicable. No additional management support is needed unless otherwise documented below in the visit note. 

## 2016-01-15 ENCOUNTER — Ambulatory Visit: Payer: Medicare Other

## 2016-02-18 ENCOUNTER — Telehealth: Payer: Self-pay | Admitting: *Deleted

## 2016-02-18 NOTE — Telephone Encounter (Signed)
Rec'd fax stating pt is needing refill on his synthroid, and manufacturer [employer]oz is unavailable so we would have to order from [employer]. Wanting to know is it ok since med is a therapeutic med.

## 2016-02-18 NOTE — Telephone Encounter (Signed)
OK to fill this prescription with additional refills x11 Thank you!  

## 2016-02-19 NOTE — Telephone Encounter (Signed)
Notified pharmacist Marcy Siren ok for change...Travis Morse

## 2016-02-20 ENCOUNTER — Ambulatory Visit (INDEPENDENT_AMBULATORY_CARE_PROVIDER_SITE_OTHER): Payer: Medicare Other

## 2016-02-20 DIAGNOSIS — Z23 Encounter for immunization: Secondary | ICD-10-CM | POA: Diagnosis not present

## 2016-03-22 ENCOUNTER — Ambulatory Visit (INDEPENDENT_AMBULATORY_CARE_PROVIDER_SITE_OTHER): Payer: Medicare Other | Admitting: Internal Medicine

## 2016-03-22 ENCOUNTER — Encounter: Payer: Self-pay | Admitting: Internal Medicine

## 2016-03-22 DIAGNOSIS — F338 Other recurrent depressive disorders: Secondary | ICD-10-CM

## 2016-03-22 DIAGNOSIS — H6121 Impacted cerumen, right ear: Secondary | ICD-10-CM | POA: Diagnosis not present

## 2016-03-22 DIAGNOSIS — J069 Acute upper respiratory infection, unspecified: Secondary | ICD-10-CM

## 2016-03-22 DIAGNOSIS — E034 Atrophy of thyroid (acquired): Secondary | ICD-10-CM | POA: Diagnosis not present

## 2016-03-22 DIAGNOSIS — R413 Other amnesia: Secondary | ICD-10-CM | POA: Diagnosis not present

## 2016-03-22 DIAGNOSIS — G4700 Insomnia, unspecified: Secondary | ICD-10-CM

## 2016-03-22 DIAGNOSIS — F339 Major depressive disorder, recurrent, unspecified: Secondary | ICD-10-CM

## 2016-03-22 MED ORDER — TRIAMCINOLONE ACETONIDE 0.1 % EX CREA: 1.0000 "application " | TOPICAL_CREAM | Freq: Two times a day (BID) | CUTANEOUS | 1 refills | Status: DC

## 2016-03-22 MED ORDER — TADALAFIL 5 MG PO TABS: 5.0000 mg | ORAL_TABLET | Freq: Every day | ORAL | 6 refills | Status: DC | PRN

## 2016-03-22 NOTE — Assessment & Plan Note (Signed)
Gabapentin for RLS

## 2016-03-22 NOTE — Assessment & Plan Note (Signed)
OTC mets

## 2016-03-22 NOTE — Assessment & Plan Note (Signed)
Will irrigate 

## 2016-03-22 NOTE — Assessment & Plan Note (Signed)
Levothroid 

## 2016-03-22 NOTE — Assessment & Plan Note (Signed)
Better w/a light box

## 2016-03-22 NOTE — Progress Notes (Signed)
Pre visit review using our clinic review tool, if applicable. No additional management support is needed unless otherwise documented below in the visit note. 

## 2016-03-22 NOTE — Progress Notes (Signed)
Subjective:  Patient ID: Travis Morse, male    DOB: June 17, 1950  Age: 66 y.o. MRN: [id number]  CC: No chief complaint on file.   HPI Travis Morse presents for hypothyroidism, B12 def, depression C/o rash  Outpatient Medications Prior to Visit  Medication Sig Dispense Refill  . Beet Root POWD Take 6 oz by mouth 3 (three) times daily.    . cholecalciferol (VITAMIN D) 1000 UNITS tablet Take 1,000 Units by mouth daily.      . Cyanocobalamin (VITAMIN B-12) 500 MCG SUBL 1 tab sl qd 100 tablet 3  . fish oil-omega-3 fatty acids 1000 MG capsule Take 4 g by mouth daily.     Marland Kitchen levothyroxine (SYNTHROID, LEVOTHROID) 25 MCG tablet Take 1 tablet (25 mcg total) by mouth daily. 90 tablet 3  . Misc Natural Products (TUMERSAID PO) Take by mouth 2 (two) times daily.    . tadalafil (CIALIS) 5 MG tablet Take 1-2 tablets (5-10 mg total) by mouth daily as needed for erectile dysfunction. 90 tablet 6   No facility-administered medications prior to visit.     ROS Review of Systems  Constitutional: Negative for appetite change, fatigue and unexpected weight change.  HENT: Negative for congestion, nosebleeds, sneezing, sore throat and trouble swallowing.   Eyes: Negative for itching and visual disturbance.  Respiratory: Negative for cough.   Cardiovascular: Negative for chest pain, palpitations and leg swelling.  Gastrointestinal: Negative for abdominal distention, blood in stool, diarrhea and nausea.  Genitourinary: Negative for frequency and hematuria.  Musculoskeletal: Negative for back pain, gait problem, joint swelling and neck pain.  Skin: Negative for rash.  Neurological: Negative for dizziness, tremors, speech difficulty and weakness.  Psychiatric/Behavioral: Negative for agitation, dysphoric mood, sleep disturbance and suicidal ideas. The patient is not nervous/anxious.     Objective:  BP 128/80   Pulse 63   Temp 98.4 F (36.9 C) (Oral)   Wt 157 lb (71.2 kg)   SpO2 98%   BMI  22.21 kg/m   BP Readings from Last 3 Encounters:  03/22/16 128/80  09/19/15 128/88  08/20/15 120/86    Wt Readings from Last 3 Encounters:  03/22/16 157 lb (71.2 kg)  09/19/15 153 lb (69.4 kg)  08/20/15 152 lb (68.9 kg)    Physical Exam  Constitutional: He is oriented to person, place, and time. He appears well-developed. No distress.  NAD  HENT:  Mouth/Throat: Oropharynx is clear and moist.  Eyes: Conjunctivae are normal. Pupils are equal, round, and reactive to light.  Neck: Normal range of motion. No JVD present. No thyromegaly present.  Cardiovascular: Normal rate, regular rhythm, normal heart sounds and intact distal pulses.  Exam reveals no gallop and no friction rub.   No murmur heard. Pulmonary/Chest: Effort normal and breath sounds normal. No respiratory distress. He has no wheezes. He has no rales. He exhibits no tenderness.  Abdominal: Soft. Bowel sounds are normal. He exhibits no distension and no mass. There is no tenderness. There is no rebound and no guarding.  Musculoskeletal: Normal range of motion. He exhibits no edema or tenderness.  Lymphadenopathy:    He has no cervical adenopathy.  Neurological: He is alert and oriented to person, place, and time. He has normal reflexes. No cranial nerve deficit. He exhibits normal muscle tone. He displays a negative Romberg sign. Coordination and gait normal.  Skin: Skin is warm and dry. No rash noted.  Psychiatric: He has a normal mood and affect. His behavior is normal. Judgment and  thought content normal.  eryth nares R wax   Procedure Note :     Procedure :  Ear irrigation   Indication:  Cerumen impaction R   Risks, including pain, dizziness, eardrum perforation, bleeding, infection and others as well as benefits were explained to the patient in detail. Verbal consent was obtained and the patient agreed to proceed.    We used "The Elephant Ear Irrigation Device" filled with lukewarm water for irrigation. A large  amount wax was recovered. Procedure has also required manual wax removal with an ear loop.   Tolerated well. Complications: None.   Postprocedure instructions :  Call if problems.    Lab Results  Component Value Date   WBC 4.9 09/16/2015   HGB 14.4 09/16/2015   HCT 42.1 09/16/2015   PLT 222.0 09/16/2015   GLUCOSE 103 (H) 09/16/2015   CHOL 168 09/16/2015   TRIG 42.0 09/16/2015   HDL 57.20 09/16/2015   LDLDIRECT 117.3 01/21/2011   LDLCALC 102 (H) 09/16/2015   ALT 16 09/16/2015   AST 29 09/16/2015   NA 140 09/16/2015   K 4.9 09/16/2015   CL 103 09/16/2015   CREATININE 0.80 09/16/2015   BUN 12 09/16/2015   CO2 31 09/16/2015   TSH 2.46 09/16/2015   PSA 0.79 09/16/2015    US Soft Tissue Head/neck  Result Date: 02/17/2012 The *RADIOLOGY REPORT* Clinical Data: Follow-up thyroid nodule. THYROID ULTRASOUND Technique: Ultrasound examination of the thyroid gland and adjacent soft tissues was performed. Comparison:  02/20/2009 Findings: Right thyroid lobe:  3.9 x 1.0 x 1.6 cm. Left thyroid lobe:  4.8 x 1.5 x 1.5 cm. Isthmus:  2 mm. Focal nodules:  Focal left lower pole solid nodule again noted. This measures 2.2 x 1.5 x 1.5 cm compared 1.9 x 1.4 x 1.2 cm previously.  No new nodules. Lymphadenopathy:  None visualized. Small scattered cervical lymph nodes, none pathologically enlarged based on imaging size criteria. IMPRESSION: Solid left lower pole nodule currently measures up to 2.2 cm compared 1.9 cm previously. Original Report Authenticated By: Rolm Baptise, M.D.    Assessment & Plan:   There are no diagnoses linked to this encounter. I am having Mr. Travis Morse maintain his fish oil-omega-3 fatty acids, cholecalciferol, Vitamin B-12, Beet Root, Misc Natural Products (TUMERSAID PO), tadalafil, and levothyroxine.  No orders of the defined types were placed in this encounter.    Follow-up: No Follow-up on file.  Walker Kehr, MD

## 2016-03-22 NOTE — Assessment & Plan Note (Signed)
Doing well - Aricept

## 2016-05-14 ENCOUNTER — Other Ambulatory Visit: Payer: Self-pay | Admitting: Internal Medicine

## 2016-09-20 ENCOUNTER — Encounter: Payer: Medicare Other | Admitting: Internal Medicine

## 2016-10-05 ENCOUNTER — Encounter: Payer: Medicare Other | Admitting: Internal Medicine

## 2016-10-14 ENCOUNTER — Ambulatory Visit (INDEPENDENT_AMBULATORY_CARE_PROVIDER_SITE_OTHER)
Admission: RE | Admit: 2016-10-14 | Discharge: 2016-10-14 | Disposition: A | Discharge status: Home / Self Care | Payer: Medicare Other | Source: Ambulatory Visit | Attending: Internal Medicine | Admitting: Internal Medicine

## 2016-10-14 ENCOUNTER — Ambulatory Visit (INDEPENDENT_AMBULATORY_CARE_PROVIDER_SITE_OTHER): Payer: Medicare Other | Admitting: Internal Medicine

## 2016-10-14 ENCOUNTER — Encounter: Payer: Self-pay | Admitting: Internal Medicine

## 2016-10-14 ENCOUNTER — Other Ambulatory Visit: Payer: Self-pay | Admitting: Internal Medicine

## 2016-10-14 VITALS — BP 126/80 | HR 59 | Temp 98.8°F | Ht 70.5 in | Wt 154.0 lb

## 2016-10-14 DIAGNOSIS — M25542 Pain in joints of left hand: Secondary | ICD-10-CM | POA: Diagnosis not present

## 2016-10-14 DIAGNOSIS — M79645 Pain in left finger(s): Secondary | ICD-10-CM

## 2016-10-14 DIAGNOSIS — Z0001 Encounter for general adult medical examination with abnormal findings: Secondary | ICD-10-CM | POA: Diagnosis not present

## 2016-10-14 DIAGNOSIS — Z0000 Encounter for general adult medical examination without abnormal findings: Secondary | ICD-10-CM

## 2016-10-14 MED ORDER — TADALAFIL 5 MG PO TABS: 5.0000 mg | ORAL_TABLET | Freq: Every day | ORAL | 6 refills | Status: DC | PRN

## 2016-10-14 MED ORDER — ZOSTER VAC RECOMB ADJUVANTED 50 MCG/0.5ML IM SUSR: 0.5000 mL | Freq: Once | INTRAMUSCULAR | 1 refills | Status: AC

## 2016-10-14 MED ORDER — ZOSTER VAC RECOMB ADJUVANTED 50 MCG/0.5ML IM SUSR: 0.5000 mL | Freq: Once | INTRAMUSCULAR | 1 refills | Status: DC

## 2016-10-14 MED ORDER — TADALAFIL 5 MG PO TABS: 5.0000 mg | ORAL_TABLET | Freq: Every day | ORAL | 11 refills | Status: DC | PRN

## 2016-10-14 NOTE — Assessment & Plan Note (Signed)
Injured L 1st MCP 2 weeks ago while skiing in [place] 8/18 X ray Ref to Dr Oneida Alar

## 2016-10-14 NOTE — Progress Notes (Signed)
Subjective:  Patient ID: Travis Morse, male    DOB: [birth year]  Age: 66 y.o. MRN: [id number]  CC: No chief complaint on file.   HPI Travis Morse presents for a well exam He injured his L 1st MCP 2 weeks ago while skiing in [place]   Outpatient Medications Prior to Visit  Medication Sig Dispense Refill  . Beet Root POWD Take 6 oz by mouth 3 (three) times daily.    . cholecalciferol (VITAMIN D) 1000 UNITS tablet Take 1,000 Units by mouth daily.      . Cyanocobalamin (VITAMIN B-12) 500 MCG SUBL 1 tab sl qd 100 tablet 3  . fish oil-omega-3 fatty acids 1000 MG capsule Take 4 g by mouth daily.     Marland Kitchen levothyroxine (SYNTHROID, LEVOTHROID) 25 MCG tablet TAKE ONE TABLET BY MOUTH ONCE DAILY 90 tablet 1  . Misc Natural Products (TUMERSAID PO) Take by mouth 2 (two) times daily.    . tadalafil (CIALIS) 5 MG tablet Take 1-2 tablets (5-10 mg total) by mouth daily as needed for erectile dysfunction. 90 tablet 6  . triamcinolone cream (KENALOG) 0.1 % Apply 1 application topically 2 (two) times daily. 30 g 1   No facility-administered medications prior to visit.     ROS Review of Systems  Constitutional: Negative for appetite change, fatigue and unexpected weight change.  HENT: Negative for congestion, nosebleeds, sneezing, sore throat and trouble swallowing.   Eyes: Negative for itching and visual disturbance.  Respiratory: Negative for cough.   Cardiovascular: Negative for chest pain, palpitations and leg swelling.  Gastrointestinal: Negative for abdominal distention, blood in stool, diarrhea and nausea.  Genitourinary: Negative for frequency and hematuria.  Musculoskeletal: Positive for arthralgias. Negative for back pain, gait problem, joint swelling and neck pain.  Skin: Negative for rash.  Neurological: Negative for dizziness, tremors, speech difficulty and weakness.  Psychiatric/Behavioral: Negative for agitation, dysphoric mood, sleep disturbance and suicidal ideas. The patient is  not nervous/anxious.     Objective:  BP 126/80 (BP Location: Left Arm, Patient Position: Sitting, Cuff Size: Normal)   Pulse (!) 59   Temp 98.8 F (37.1 C) (Oral)   Ht 5' 10.5" (1.791 m)   Wt 154 lb (69.9 kg)   SpO2 98%   BMI 21.78 kg/m   BP Readings from Last 3 Encounters:  10/14/16 126/80  03/22/16 128/80  09/19/15 128/88    Wt Readings from Last 3 Encounters:  10/14/16 154 lb (69.9 kg)  03/22/16 157 lb (71.2 kg)  09/19/15 153 lb (69.4 kg)    Physical Exam  Constitutional: He is oriented to person, place, and time. He appears well-developed. No distress.  NAD  HENT:  Mouth/Throat: Oropharynx is clear and moist.  Eyes: Pupils are equal, round, and reactive to light. Conjunctivae are normal.  Neck: Normal range of motion. No JVD present. No thyromegaly present.  Cardiovascular: Normal rate, regular rhythm, normal heart sounds and intact distal pulses.  Exam reveals no gallop and no friction rub.   No murmur heard. Pulmonary/Chest: Effort normal and breath sounds normal. No respiratory distress. He has no wheezes. He has no rales. He exhibits no tenderness.  Abdominal: Soft. Bowel sounds are normal. He exhibits no distension and no mass. There is no tenderness. There is no rebound and no guarding.  Genitourinary: Rectum normal and prostate normal. Rectal exam shows guaiac negative stool.  Musculoskeletal: Normal range of motion. He exhibits tenderness. He exhibits no edema.  Lymphadenopathy:    He has no  cervical adenopathy.  Neurological: He is alert and oriented to person, place, and time. He has normal reflexes. No cranial nerve deficit. He exhibits normal muscle tone. He displays a negative Romberg sign. Coordination and gait normal.  Skin: Skin is warm and dry. No rash noted.  Psychiatric: He has a normal mood and affect. His behavior is normal. Judgment and thought content normal.  L 1st MCP painful, swollen  Lab Results  Component Value Date   WBC 4.9 09/16/2015     HGB 14.4 09/16/2015   HCT 42.1 09/16/2015   PLT 222.0 09/16/2015   GLUCOSE 103 (H) 09/16/2015   CHOL 168 09/16/2015   TRIG 42.0 09/16/2015   HDL 57.20 09/16/2015   LDLDIRECT 117.3 01/21/2011   LDLCALC 102 (H) 09/16/2015   ALT 16 09/16/2015   AST 29 09/16/2015   NA 140 09/16/2015   K 4.9 09/16/2015   CL 103 09/16/2015   CREATININE 0.80 09/16/2015   BUN 12 09/16/2015   CO2 31 09/16/2015   TSH 2.46 09/16/2015   PSA 0.79 09/16/2015    US Soft Tissue Head/neck  Result Date: 02/17/2012 The *RADIOLOGY REPORT* Clinical Data: Follow-up thyroid nodule. THYROID ULTRASOUND Technique: Ultrasound examination of the thyroid gland and adjacent soft tissues was performed. Comparison:  02/20/2009 Findings: Right thyroid lobe:  3.9 x 1.0 x 1.6 cm. Left thyroid lobe:  4.8 x 1.5 x 1.5 cm. Isthmus:  2 mm. Focal nodules:  Focal left lower pole solid nodule again noted. This measures 2.2 x 1.5 x 1.5 cm compared 1.9 x 1.4 x 1.2 cm previously.  No new nodules. Lymphadenopathy:  None visualized. Small scattered cervical lymph nodes, none pathologically enlarged based on imaging size criteria. IMPRESSION: Solid left lower pole nodule currently measures up to 2.2 cm compared 1.9 cm previously. Original Report Authenticated By: Rolm Baptise, M.D.    Assessment & Plan:   There are no diagnoses linked to this encounter. I am having Travis Morse maintain his fish oil-omega-3 fatty acids, cholecalciferol, Vitamin B-12, Beet Root, Misc Natural Products (TUMERSAID PO), tadalafil, triamcinolone cream, levothyroxine, and TURMERIC PO.  Meds ordered this encounter  Medications  . TURMERIC PO    Sig: Take by mouth.     Follow-up: No Follow-up on file.  Walker Kehr, MD

## 2016-10-14 NOTE — Assessment & Plan Note (Signed)
  Here for medicare wellness/physical  Diet: heart healthy  Physical activity: not sedentary - very active Depression/mood screen: negative  Hearing: intact to whispered voice  Visual acuity: grossly normal, performs annual eye exam  ADLs: capable  Fall risk: low to none  Home safety: good  Cognitive evaluation: intact to orientation, naming, recall and repetition  EOL planning: adv directives, full code/ I agree  I have personally reviewed and have noted  1. The patient's medical, surgical and social history  2. Their use of alcohol, tobacco or illicit drugs  3. Their current medications and supplements  4. The patient's functional ability including ADL's, fall risks, home safety risks and hearing or visual impairment.  5. Diet and physical activities  6. Evidence for depression or mood disorders 7. The roster of all physicians providing medical care to patient - is listed in the Snapshot section of the chart and reviewed today.    Today patient counseled on age appropriate routine health concerns for screening and prevention, each reviewed and up to date or declined. Immunizations reviewed and up to date or declined. Labs ordered and reviewed. Risk factors for depression reviewed and negative. Hearing function and visual acuity are intact. ADLs screened and addressed as needed. Functional ability and level of safety reviewed and appropriate. Education, counseling and referrals performed based on assessed risks today. Patient provided with a copy of personalized plan for preventive services.       Colon was due in 2016 Pt would like to do a cologuard

## 2016-10-15 ENCOUNTER — Other Ambulatory Visit (INDEPENDENT_AMBULATORY_CARE_PROVIDER_SITE_OTHER): Payer: Medicare Other

## 2016-10-15 DIAGNOSIS — Z0000 Encounter for general adult medical examination without abnormal findings: Secondary | ICD-10-CM | POA: Diagnosis not present

## 2016-10-15 LAB — BASIC METABOLIC PANEL
BUN: 9 mg/dL (ref 6–23)
CALCIUM: 9.1 mg/dL (ref 8.4–10.5)
CO2: 32 mEq/L (ref 19–32)
CREATININE: 0.78 mg/dL (ref 0.40–1.50)
Chloride: 102 mEq/L (ref 96–112)
GFR: 105.59 mL/min (ref >60.00)
Glucose, Bld: 99 mg/dL (ref 70–99)
Potassium: 4 mEq/L (ref 3.5–5.1)
Sodium: 139 mEq/L (ref 135–145)

## 2016-10-15 LAB — CBC WITH DIFFERENTIAL/PLATELET
BASOS ABS: 0 10*3/uL (ref 0.0–0.1)
BASOS PCT: 1.2 % (ref 0.0–3.0)
EOS ABS: 0.1 10*3/uL (ref 0.0–0.7)
Eosinophils Relative: 3 % (ref 0.0–5.0)
HEMATOCRIT: 42.1 % (ref 39.0–52.0)
HEMOGLOBIN: 14.5 g/dL (ref 13.0–17.0)
LYMPHS PCT: 37.8 % (ref 12.0–46.0)
Lymphs Abs: 1.3 10*3/uL (ref 0.7–4.0)
MCHC: 34.4 g/dL (ref 30.0–36.0)
MCV: 99.5 fl (ref 78.0–100.0)
MONOS PCT: 9 % (ref 3.0–12.0)
Monocytes Absolute: 0.3 10*3/uL (ref 0.1–1.0)
Neutro Abs: 1.7 10*3/uL (ref 1.4–7.7)
Neutrophils Relative %: 49 % (ref 43.0–77.0)
Platelets: 222 10*3/uL (ref 150.0–400.0)
RBC: 4.23 Mil/uL (ref 4.22–5.81)
RDW: 12.2 % (ref 11.5–15.5)
WBC: 3.5 10*3/uL — AB (ref 4.0–10.5)

## 2016-10-15 LAB — HEPATIC FUNCTION PANEL
ALT: 20 U/L (ref 0–53)
AST: 33 U/L (ref 0–37)
Albumin: 4.2 g/dL (ref 3.5–5.2)
Alkaline Phosphatase: 54 U/L (ref 39–117)
Bilirubin, Direct: 0.1 mg/dL (ref 0.0–0.3)
Total Bilirubin: 0.5 mg/dL (ref 0.2–1.2)
Total Protein: 7 g/dL (ref 6.0–8.3)

## 2016-10-15 LAB — URINALYSIS
Hgb urine dipstick: NEGATIVE
KETONES UR: 15 — AB
LEUKOCYTES UA: NEGATIVE
NITRITE: NEGATIVE
SPECIFIC GRAVITY, URINE: 1.015 (ref 1.000–1.030)
Total Protein, Urine: NEGATIVE
Urine Glucose: NEGATIVE
Urobilinogen, UA: 0.2 (ref 0.0–1.0)
pH: 7.5 (ref 5.0–8.0)

## 2016-10-15 LAB — LIPID PANEL
Cholesterol: 162 mg/dL (ref 0–200)
HDL: 58.2 mg/dL (ref >39.00)
LDL CALC: 95 mg/dL (ref 0–99)
NonHDL: 103.5
TRIGLYCERIDES: 41 mg/dL (ref 0.0–149.0)
Total CHOL/HDL Ratio: 3
VLDL: 8.2 mg/dL (ref 0.0–40.0)

## 2016-10-15 LAB — PSA: PSA: 0.83 ng/mL (ref 0.10–4.00)

## 2016-10-15 LAB — TSH: TSH: 2.72 u[IU]/mL (ref 0.35–4.50)

## 2016-10-15 NOTE — Addendum Note (Signed)
Addended by: Karle Barr on: 10/15/2016 01:33 PM   Modules accepted: Orders

## 2016-10-18 ENCOUNTER — Encounter: Payer: Self-pay | Admitting: Family Medicine

## 2016-10-18 ENCOUNTER — Ambulatory Visit (INDEPENDENT_AMBULATORY_CARE_PROVIDER_SITE_OTHER): Payer: Medicare Other | Admitting: Family Medicine

## 2016-10-18 DIAGNOSIS — S6991XA Unspecified injury of right wrist, hand and finger(s), initial encounter: Secondary | ICD-10-CM

## 2016-10-19 DIAGNOSIS — S6991XA Unspecified injury of right wrist, hand and finger(s), initial encounter: Secondary | ICD-10-CM | POA: Insufficient documentation

## 2016-10-19 NOTE — Progress Notes (Signed)
PCP: Plotnikov, Evie Lacks, MD  Subjective:   HPI: Patient is a 66 y.o. male here for right thumb injury.  Patient reports on 7/23 he was skiing when he hit a rock formation hard while wearing ski gloves. Felt sharp pain near base of right thumb. States pain is minimal now but has trouble trying to open doors, shake hands. Had a lot of swelling and bruising initially. Pain level currently 0/10. No skin changes, numbness.  Past Medical History:  Diagnosis Date  . Allergy    allergic to bee stings  . Anxiety   . Depression   . Hx of colonic polyps    Dr Henrene Pastor  . Hypothyroidism 2008  . Insomnia   . Memory difficulty   . Multiple thyroid nodules     Current Outpatient Prescriptions on File Prior to Visit  Medication Sig Dispense Refill  . Beet Root POWD Take 6 oz by mouth 3 (three) times daily.    . cholecalciferol (VITAMIN D) 1000 UNITS tablet Take 1,000 Units by mouth daily.      . Cyanocobalamin (VITAMIN B-12) 500 MCG SUBL 1 tab sl qd 100 tablet 3  . fish oil-omega-3 fatty acids 1000 MG capsule Take 4 g by mouth daily.     Marland Kitchen levothyroxine (SYNTHROID, LEVOTHROID) 25 MCG tablet TAKE ONE TABLET BY MOUTH ONCE DAILY 90 tablet 1  . Misc Natural Products (TUMERSAID PO) Take by mouth 2 (two) times daily.    . tadalafil (CIALIS) 5 MG tablet Take 1-2 tablets (5-10 mg total) by mouth daily as needed for erectile dysfunction. 90 tablet 11  . triamcinolone cream (KENALOG) 0.1 % Apply 1 application topically 2 (two) times daily. 30 g 1  . TURMERIC PO Take by mouth.     No current facility-administered medications on file prior to visit.     No past surgical history on file.  Allergies  Allergen Reactions  . Penicillins     Social History   Social History  . Marital status: Single    Spouse name: N/A  . Number of children: N/A  . Years of education: N/A   Occupational History  . Not on file.   Social History Main Topics  . Smoking status: Never Smoker  . Smokeless  tobacco: Never Used  . Alcohol use Yes     Comment: 1 glass a wine a day   . Drug use: No  . Sexual activity: Yes    Partners: Female   Other Topics Concern  . Not on file   Social History Narrative  . No narrative on file    Family History  Problem Relation Age of Onset  . Coronary artery disease Father        107's  . Heart disease Father 62       MI  . Thyroid disease Neg Hx     BP 138/84   Pulse (!) 55   Ht 5\' 11"  (1.803 m)   Wt 154 lb (69.9 kg)   BMI 21.48 kg/m   Review of Systems: See HPI above.     Objective:  Physical Exam:  Gen: NAD, comfortable in exam room  Right hand/thumb: No gross deformity, bruising.  Mild swelling base 1st digit. No malrotation or angulation. TTP mildly 1st CMC.  No other tenderness. Able to flex and extend at IP, MCP joints against resistance. Collateral ligaments intact 1st MCP, IP joints. NVI distally.  Left hand: FROM digits without pain.   Assessment & Plan:  1.  Right thumb injury - independently reviewed radiographs - Patient has a Bennett fracture with displacement.  Advised need for referral to hand surgery and importance of 1st [facility] joint for grip strength, function, increased risk of long term pain if this is not treated appropriately (typically requiring percutaneous pinning or ORIF with subluxation > 25mm).  Phone call placed to hand surgeon (Dr. Amedeo Plenty out of office this week - Dr. Caralyn Guile in surgery currently but contacted - we will try to get patient in asap).  Offered thumb spica brace to patient - he is already 3 weeks out from the injury.  Tylenol, aleve or motrin if needed.

## 2016-10-19 NOTE — Assessment & Plan Note (Signed)
independently reviewed radiographs - Patient has a Bennett fracture with displacement.  Advised need for referral to hand surgery and importance of 1st [facility] joint for grip strength, function, increased risk of long term pain if this is not treated appropriately (typically requiring percutaneous pinning or ORIF with subluxation > 44mm).  Phone call placed to hand surgeon (Dr. Amedeo Plenty out of office this week - Dr. Caralyn Guile in surgery currently but contacted - we will try to get patient in asap).  Offered thumb spica brace to patient - he is already 3 weeks out from the injury.  Tylenol, aleve or motrin if needed.

## 2016-10-22 ENCOUNTER — Encounter (HOSPITAL_COMMUNITY): Payer: Self-pay | Admitting: *Deleted

## 2016-10-22 NOTE — H&P (Signed)
  Travis Morse is an 66 y.o. male.   Chief Complaint: RIGHT THUMB CARPOMETACARPAL FRACTURE DISLOCATION  HPI: Travis Morse is a 66 year old right hand dominant male who sustained an injury to the thumb on 09/27/16.  They were seen by another provider where they had radiographs and were put into a thumb spica splint.  The patient followed up in our office for further evaluation. Repeat radiographs were done and reviewed the results with the patient. Discussed the reason and rationale for surgical intervention.  Discussed the surgical procedure, including the risks versus benefits, and the post-operative recovery.  The patient is here today for surgery.   Past Medical History:  Diagnosis Date  . Allergy    allergic to bee stings  . Anxiety   . Depression   . Hx of colonic polyps    Dr Henrene Pastor  . Hypothyroidism 2008  . Insomnia   . Memory difficulty   . Multiple thyroid nodules     No past surgical history on file.  Family History  Problem Relation Age of Onset  . Coronary artery disease Father        23's  . Heart disease Father 101       MI  . Thyroid disease Neg Hx    Social History:  reports that he has never smoked. He has never used smokeless tobacco. He reports that he drinks alcohol. He reports that he does not use drugs.  Allergies:  Allergies  Allergen Reactions  . Penicillins     No prescriptions prior to admission.    No results found for this or any previous visit (from the past 48 hour(s)). No results found.  ROS NO RECENT ILLNESSES OR HOSPITALIZATIONS  There were no vitals taken for this visit. Physical Exam  General Appearance:  Alert, cooperative, no distress, appears stated age  Head:  Normocephalic, without obvious abnormality, atraumatic  Eyes:  Pupils equal, conjunctiva/corneas clear,         Throat: Lips, mucosa, and tongue normal; teeth and gums normal  Neck: No visible masses     Lungs:   respirations unlabored  Chest Wall:  No tenderness  or deformity  Heart:  Regular rate and rhythm,  Abdomen:   Soft, non-tender,         Extremities: RUE: SKIN INTACT, FINGERS WARM WELL PERFUSED ABLE TO EXTEND THUMB +DEFORMITY TO CMC REGION GOOD MUSCLE TONE IN HAND  Pulses: 2+ and symmetric  Skin: Skin color, texture, turgor normal, no rashes or lesions     Neurologic: Normal    Assessment RIGHT THUMB CARPOMETACARPAL FRACTURE DISLOCATION  Plan RIGHT THUMB CLOSED REDUCTION AND PINNING, POSSIBLE OPEN REDUCTION AND INTERNAL FIXATION  R/B/A DISCUSSED WITH PT IN OFFICE.  PT VOICED UNDERSTANDING OF PLAN CONSENT SIGNED DAY OF SURGERY PT SEEN AND EXAMINED PRIOR TO OPERATIVE PROCEDURE/DAY OF SURGERY SITE MARKED. QUESTIONS ANSWERED WILL GO HOME FOLLOWING SURGERY  WE ARE PLANNING SURGERY FOR YOUR UPPER EXTREMITY. THE RISKS AND BENEFITS OF SURGERY INCLUDE BUT NOT LIMITED TO BLEEDING INFECTION, DAMAGE TO NEARBY NERVES ARTERIES TENDONS, FAILURE OF SURGERY TO ACCOMPLISH ITS INTENDED GOALS, PERSISTENT SYMPTOMS AND NEED FOR FURTHER SURGICAL INTERVENTION. WITH THIS IN MIND WE WILL PROCEED. I HAVE DISCUSSED WITH THE PATIENT THE PRE AND POSTOPERATIVE REGIMEN AND THE DOS AND DON'TS. PT VOICED UNDERSTANDING AND INFORMED CONSENT SIGNED.   Brynda Peon 10/22/2016, 7:42 AM

## 2016-10-23 ENCOUNTER — Ambulatory Visit (HOSPITAL_COMMUNITY): Payer: Medicare Other | Admitting: Anesthesiology

## 2016-10-23 ENCOUNTER — Encounter (HOSPITAL_COMMUNITY)
Admission: RE | Disposition: A | Discharge status: Home / Self Care | Payer: Self-pay | Source: Ambulatory Visit | Attending: Orthopedic Surgery

## 2016-10-23 ENCOUNTER — Ambulatory Visit (HOSPITAL_COMMUNITY)
Admission: RE | Admit: 2016-10-23 | Discharge: 2016-10-23 | Disposition: A | Discharge status: Home / Self Care | Payer: Medicare Other | Source: Ambulatory Visit | Attending: Orthopedic Surgery | Admitting: Orthopedic Surgery

## 2016-10-23 ENCOUNTER — Encounter (HOSPITAL_COMMUNITY): Payer: Self-pay | Admitting: *Deleted

## 2016-10-23 DIAGNOSIS — S62201A Unspecified fracture of first metacarpal bone, right hand, initial encounter for closed fracture: Secondary | ICD-10-CM | POA: Insufficient documentation

## 2016-10-23 DIAGNOSIS — X58XXXA Exposure to other specified factors, initial encounter: Secondary | ICD-10-CM | POA: Diagnosis not present

## 2016-10-23 DIAGNOSIS — Z880 Allergy status to penicillin: Secondary | ICD-10-CM | POA: Insufficient documentation

## 2016-10-23 DIAGNOSIS — Z79899 Other long term (current) drug therapy: Secondary | ICD-10-CM | POA: Diagnosis not present

## 2016-10-23 DIAGNOSIS — E039 Hypothyroidism, unspecified: Secondary | ICD-10-CM | POA: Insufficient documentation

## 2016-10-23 DIAGNOSIS — E042 Nontoxic multinodular goiter: Secondary | ICD-10-CM | POA: Diagnosis not present

## 2016-10-23 DIAGNOSIS — S6991XA Unspecified injury of right wrist, hand and finger(s), initial encounter: Secondary | ICD-10-CM

## 2016-10-23 HISTORY — DX: Personal history of urinary calculi: Z87.442

## 2016-10-23 HISTORY — PX: OPEN REDUCTION INTERNAL FIXATION (ORIF) METACARPAL: SHX6234

## 2016-10-23 SURGERY — OPEN REDUCTION INTERNAL FIXATION (ORIF) METACARPAL
Anesthesia: General | Site: Thumb | Laterality: Right

## 2016-10-23 MED ORDER — BUPIVACAINE HCL (PF) 0.5 % IJ SOLN
INTRAMUSCULAR | Status: AC
  Filled 2016-10-23: qty 30

## 2016-10-23 MED ORDER — 0.9 % SODIUM CHLORIDE (POUR BTL) OPTIME
TOPICAL | Status: DC | PRN
  Administered 2016-10-23: 1000 mL

## 2016-10-23 MED ORDER — ONDANSETRON HCL 4 MG/2ML IJ SOLN
INTRAMUSCULAR | Status: DC | PRN
  Administered 2016-10-23: 4 mg via INTRAVENOUS

## 2016-10-23 MED ORDER — PROMETHAZINE HCL 25 MG/ML IJ SOLN: 6.2500 mg | INTRAMUSCULAR | Status: DC | PRN

## 2016-10-23 MED ORDER — PROPOFOL 10 MG/ML IV BOLUS
INTRAVENOUS | Status: AC
  Filled 2016-10-23: qty 20

## 2016-10-23 MED ORDER — FENTANYL CITRATE (PF) 250 MCG/5ML IJ SOLN
INTRAMUSCULAR | Status: AC
  Filled 2016-10-23: qty 5

## 2016-10-23 MED ORDER — MIDAZOLAM HCL 2 MG/2ML IJ SOLN
INTRAMUSCULAR | Status: AC
  Filled 2016-10-23: qty 2

## 2016-10-23 MED ORDER — BUPIVACAINE HCL 0.5 % IJ SOLN
INTRAMUSCULAR | Status: DC | PRN
  Administered 2016-10-23: 9 mL

## 2016-10-23 MED ORDER — HYDROMORPHONE HCL 1 MG/ML IJ SOLN
INTRAMUSCULAR | Status: AC
  Filled 2016-10-23: qty 1

## 2016-10-23 MED ORDER — LIDOCAINE 2% (20 MG/ML) 5 ML SYRINGE
INTRAMUSCULAR | Status: DC | PRN
  Administered 2016-10-23: 50 mg via INTRAVENOUS

## 2016-10-23 MED ORDER — CHLORHEXIDINE GLUCONATE 4 % EX LIQD: 60.0000 mL | Freq: Once | CUTANEOUS | Status: DC

## 2016-10-23 MED ORDER — OXYCODONE HCL 5 MG PO TABS: 5.0000 mg | ORAL_TABLET | Freq: Once | ORAL | Status: DC | PRN

## 2016-10-23 MED ORDER — ONDANSETRON HCL 4 MG/2ML IJ SOLN
INTRAMUSCULAR | Status: AC
  Filled 2016-10-23: qty 2

## 2016-10-23 MED ORDER — LIDOCAINE 2% (20 MG/ML) 5 ML SYRINGE
INTRAMUSCULAR | Status: AC
  Filled 2016-10-23: qty 5

## 2016-10-23 MED ORDER — HYDROMORPHONE HCL 1 MG/ML IJ SOLN
0.2500 mg | INTRAMUSCULAR | Status: DC | PRN
  Administered 2016-10-23: 0.5 mg via INTRAVENOUS

## 2016-10-23 MED ORDER — PROPOFOL 10 MG/ML IV BOLUS
INTRAVENOUS | Status: DC | PRN
  Administered 2016-10-23: 200 mg via INTRAVENOUS

## 2016-10-23 MED ORDER — LACTATED RINGERS IV SOLN
INTRAVENOUS | Status: DC
  Administered 2016-10-23 (×2): via INTRAVENOUS

## 2016-10-23 MED ORDER — LACTATED RINGERS IV SOLN: INTRAVENOUS | Status: DC

## 2016-10-23 MED ORDER — MIDAZOLAM HCL 2 MG/2ML IJ SOLN
INTRAMUSCULAR | Status: DC | PRN
  Administered 2016-10-23: 2 mg via INTRAVENOUS

## 2016-10-23 MED ORDER — CLINDAMYCIN PHOSPHATE 900 MG/50ML IV SOLN
900.0000 mg | INTRAVENOUS | Status: AC
  Administered 2016-10-23: 900 mg via INTRAVENOUS
  Filled 2016-10-23: qty 50

## 2016-10-23 MED ORDER — FENTANYL CITRATE (PF) 100 MCG/2ML IJ SOLN
INTRAMUSCULAR | Status: DC | PRN
  Administered 2016-10-23: 100 ug via INTRAVENOUS

## 2016-10-23 MED ORDER — DEXAMETHASONE SODIUM PHOSPHATE 10 MG/ML IJ SOLN
INTRAMUSCULAR | Status: AC
  Filled 2016-10-23: qty 1

## 2016-10-23 MED ORDER — MEPERIDINE HCL 25 MG/ML IJ SOLN: 6.2500 mg | INTRAMUSCULAR | Status: DC | PRN

## 2016-10-23 MED ORDER — OXYCODONE HCL 5 MG/5ML PO SOLN: 5.0000 mg | Freq: Once | ORAL | Status: DC | PRN

## 2016-10-23 SURGICAL SUPPLY — 48 items
BANDAGE ACE 3X5.8 VEL STRL LF (GAUZE/BANDAGES/DRESSINGS) ×3 IMPLANT
BANDAGE ACE 4X5 VEL STRL LF (GAUZE/BANDAGES/DRESSINGS) ×3 IMPLANT
BANDAGE ELASTIC 3 VELCRO ST LF (GAUZE/BANDAGES/DRESSINGS) ×3 IMPLANT
BLADE CLIPPER SURG (BLADE) IMPLANT
BNDG ELASTIC 2 VLCR STRL LF (GAUZE/BANDAGES/DRESSINGS) ×3 IMPLANT
BNDG ESMARK 4X9 LF (GAUZE/BANDAGES/DRESSINGS) ×3 IMPLANT
BNDG GAUZE ELAST 4 BULKY (GAUZE/BANDAGES/DRESSINGS) ×3 IMPLANT
CLOSURE WOUND 1/2 X4 (GAUZE/BANDAGES/DRESSINGS)
CORDS BIPOLAR (ELECTRODE) ×3 IMPLANT
COVER SURGICAL LIGHT HANDLE (MISCELLANEOUS) ×3 IMPLANT
CUFF TOURNIQUET SINGLE 18IN (TOURNIQUET CUFF) ×3 IMPLANT
CUFF TOURNIQUET SINGLE 24IN (TOURNIQUET CUFF) IMPLANT
DRAPE OEC MINIVIEW 54X84 (DRAPES) ×3 IMPLANT
DRAPE SURG 17X11 SM STRL (DRAPES) ×3 IMPLANT
DRSG ADAPTIC 3X8 NADH LF (GAUZE/BANDAGES/DRESSINGS) ×3 IMPLANT
GAUZE SPONGE 4X4 12PLY STRL (GAUZE/BANDAGES/DRESSINGS) ×3 IMPLANT
GAUZE SPONGE 4X4 16PLY XRAY LF (GAUZE/BANDAGES/DRESSINGS) IMPLANT
GAUZE XEROFORM 1X8 LF (GAUZE/BANDAGES/DRESSINGS) ×3 IMPLANT
GLOVE BIOGEL PI IND STRL 8.5 (GLOVE) ×1 IMPLANT
GLOVE BIOGEL PI INDICATOR 8.5 (GLOVE) ×2
GLOVE SURG ORTHO 8.0 STRL STRW (GLOVE) ×3 IMPLANT
GOWN STRL REUS W/ TWL LRG LVL3 (GOWN DISPOSABLE) ×3 IMPLANT
GOWN STRL REUS W/ TWL XL LVL3 (GOWN DISPOSABLE) ×1 IMPLANT
GOWN STRL REUS W/TWL LRG LVL3 (GOWN DISPOSABLE) ×6
GOWN STRL REUS W/TWL XL LVL3 (GOWN DISPOSABLE) ×2
K-WIRE COCR 1.1X105 (WIRE) ×3
K-WIRE DBL TROCAR .045X4 ×6 IMPLANT
KIT BASIN OR (CUSTOM PROCEDURE TRAY) ×3 IMPLANT
KIT ROOM TURNOVER OR (KITS) ×3 IMPLANT
KWIRE COCR 1.1X105 (WIRE) ×1 IMPLANT
KWIRE DBL TROCAR .045X4 ×2 IMPLANT
MANIFOLD NEPTUNE II (INSTRUMENTS) ×3 IMPLANT
NEEDLE HYPO 25X1 1.5 SAFETY (NEEDLE) ×3 IMPLANT
NS IRRIG 1000ML POUR BTL (IV SOLUTION) ×3 IMPLANT
PACK ORTHO EXTREMITY (CUSTOM PROCEDURE TRAY) ×3 IMPLANT
PAD ARMBOARD 7.5X6 YLW CONV (MISCELLANEOUS) ×6 IMPLANT
PAD CAST 4YDX4 CTTN HI CHSV (CAST SUPPLIES) ×1 IMPLANT
PADDING CAST COTTON 4X4 STRL (CAST SUPPLIES) ×2
SOAP 2 % CHG 4 OZ (WOUND CARE) ×3 IMPLANT
SPLINT FIBERGLASS 3X12 (CAST SUPPLIES) ×3 IMPLANT
STRIP CLOSURE SKIN 1/2X4 (GAUZE/BANDAGES/DRESSINGS) IMPLANT
SYR CONTROL 10ML LL (SYRINGE) IMPLANT
TOWEL OR 17X24 6PK STRL BLUE (TOWEL DISPOSABLE) ×3 IMPLANT
TOWEL OR 17X26 10 PK STRL BLUE (TOWEL DISPOSABLE) ×3 IMPLANT
TUBE CONNECTING 12'X1/4 (SUCTIONS) ×1
TUBE CONNECTING 12X1/4 (SUCTIONS) ×2 IMPLANT
WATER STERILE IRR 1000ML POUR (IV SOLUTION) ×3 IMPLANT
YANKAUER SUCT BULB TIP NO VENT (SUCTIONS) IMPLANT

## 2016-10-23 NOTE — Anesthesia Procedure Notes (Signed)
Procedure Name: LMA Insertion Date/Time: 10/23/2016 9:19 AM Performed by: Trixie Deis A Pre-anesthesia Checklist: Patient identified, Emergency Drugs available, Suction available and Patient being monitored Patient Re-evaluated:Patient Re-evaluated prior to induction Oxygen Delivery Method: Circle System Utilized Preoxygenation: Pre-oxygenation with 100% oxygen Induction Type: IV induction Ventilation: Mask ventilation without difficulty LMA: LMA inserted LMA Size: 5.0 Number of attempts: 1 Airway Equipment and Method: Bite block Placement Confirmation: positive ETCO2 Tube secured with: Tape Dental Injury: Teeth and Oropharynx as per pre-operative assessment

## 2016-10-23 NOTE — Addendum Note (Signed)
Addendum  created 10/23/16 1144 by Leonor Liv, CRNA   Anesthesia Intra Flowsheets edited

## 2016-10-23 NOTE — Transfer of Care (Signed)
Immediate Anesthesia Transfer of Care Note  Patient: Travis Morse  Procedure(s) Performed: Procedure(s): Right thumb closed reduction and pinning (Right)  Patient Location: PACU  Anesthesia Type:General  Level of Consciousness: awake, alert  and oriented  Airway & Oxygen Therapy: Patient Spontanous Breathing and Patient connected to nasal cannula oxygen  Post-op Assessment: Report given to RN, Post -op Vital signs reviewed and stable and Patient moving all extremities  Post vital signs: Reviewed and stable  Last Vitals:  Vitals:   10/23/16 0752  BP: 120/77  Pulse: (!) 55  Resp: 16  Temp: 36.9 C  SpO2: 99%    Last Pain:  Vitals:   10/23/16 0752  TempSrc: Oral      Patients Stated Pain Goal: 3 (33/58/25 1898)  Complications: No apparent anesthesia complications

## 2016-10-23 NOTE — Anesthesia Preprocedure Evaluation (Addendum)
Anesthesia Evaluation  Patient identified by MRN, date of birth, ID band Patient awake    Reviewed: Allergy & Precautions, NPO status , Patient's Chart, lab work & pertinent test results  Airway Mallampati: I  TM Distance: >3 FB Neck ROM: Full    Dental  (+) Chipped,    Pulmonary neg pulmonary ROS,    breath sounds clear to auscultation       Cardiovascular negative cardio ROS   Rhythm:Regular Rate:Normal     Neuro/Psych negative neurological ROS     GI/Hepatic negative GI ROS, Neg liver ROS,   Endo/Other  Hypothyroidism   Renal/GU negative Renal ROS     Musculoskeletal negative musculoskeletal ROS (+)   Abdominal   Peds  Hematology negative hematology ROS (+)   Anesthesia Other Findings Day of surgery medications reviewed with the patient.  Reproductive/Obstetrics                            Anesthesia Physical Anesthesia Plan  ASA: II  Anesthesia Plan: General   Post-op Pain Management:    Induction: Intravenous  PONV Risk Score and Plan: 3 and Ondansetron, Dexamethasone, Midazolam and Propofol infusion  Airway Management Planned: LMA  Additional Equipment:   Intra-op Plan:   Post-operative Plan: Extubation in OR  Informed Consent: I have reviewed the patients History and Physical, chart, labs and discussed the procedure including the risks, benefits and alternatives for the proposed anesthesia with the patient or authorized representative who has indicated his/her understanding and acceptance.   Dental advisory given  Plan Discussed with: CRNA  Anesthesia Plan Comments: (Pt consented for post op block if necessary. )       Anesthesia Quick Evaluation

## 2016-10-23 NOTE — Anesthesia Postprocedure Evaluation (Signed)
Anesthesia Post Note  Patient: Travis Morse  Procedure(s) Performed: Procedure(s) (LRB): Right thumb closed reduction and pinning (Right)     Patient location during evaluation: PACU Anesthesia Type: General Level of consciousness: awake and alert Pain management: pain level controlled Vital Signs Assessment: post-procedure vital signs reviewed and stable Respiratory status: spontaneous breathing, nonlabored ventilation, respiratory function stable and patient connected to nasal cannula oxygen Cardiovascular status: blood pressure returned to baseline and stable Postop Assessment: no signs of nausea or vomiting Anesthetic complications: no    Last Vitals:  Vitals:   10/23/16 1115 10/23/16 1124  BP:  132/72  Pulse: (!) 45 (!) 47  Resp: 10 10  Temp:  (!) 36.3 C  SpO2: 99% 98%    Last Pain:  Vitals:   10/23/16 1124  TempSrc:   PainSc: 3                  Effie Berkshire

## 2016-10-23 NOTE — Op Note (Signed)
PREOPERATIVE DIAGNOSIS: Right thumb Bennett's fracture dislocation  POSTOPERATIVE DIAGNOSIS: Same  ATTENDING SURGEON: Dr. Gavin Pound who was scrubbed and present for the entire procedure  ASSISTANT SURGEON: None  ANESTHESIA: Gen. via LMA  OPERATIVE PROCEDURE: #1: Closed reduction and percutaneous skeletal fixation of an unstable right thumb CMC fracture dislocation #2: Radiographs 3 views right hand  IMPLANTS: 2 0.045 K wires  RADIOGRAPHIC INTERPRETATION: AP lateral and hyper pronated views of the thumb do show the cross K wire fixation with good alignment of the thumb basilar joint  SURGICAL INDICATIONS: Mr. Jentz is a right-hand-dominant gentleman who sustained a closed right thumb injury more than 3 weeks ago. Patient seen and evaluated in the office and recommended undergo the above procedure. Risks benefits and alternatives were discussed in detail the patient in a signed informed consent was obtained. Risks include but not limited to bleeding infection damage to nearby nerves arteries or tendons loss of motion of the wrists and digits incomplete relief of symptoms and need for further surgical intervention.  SURGICAL TECHNIQUE: Patient is properly identified in the preoperative holding area marked for permanent marker made on the right thumb indicate correct upper site. Patient is then brought back to the operating room placed supine on anesthesia and table general anesthesia was administered. Patient tolerated this well. A well-padded tourniquet was then placed on the right brachium and sealed with a 1000 drape. The right upper extremity was then prepped and draped in normal sterile fashion. Timeout was called cracks I was identified and the procedure was then begun. Attention was then turned to the right thumb. Using the reduction maneuver with the putting downward pressure on the thumb metacarpal thumb pronated in mild extension close manipulation was able to reduce the joint. Following  this 2 0.045 K wires were placed across the thumb CMC joint. Final radiographs were then obtained. K wires were then cut and bent left out of the skin. Patient tolerated the procedure well. Patient is then placed in well-padded thumb spica splint. Xeroform was applied around the pin sites. Patient was extubated taken recovery room in good condition  POSTOPERATIVE PLAN: Patient be discharged to home seen back in the office in approximately 11-12 days for pin check x-rays R thumb spica cast for a total 4 weeks immobilization. We'll place the therapy order at the first postoperative visit to begin therapy once the pins were taken out. Radiographs at each visit.

## 2016-10-23 NOTE — Discharge Instructions (Signed)
KEEP [facility] AND [facility] CALL OFFICE FOR F/U APPT [phone] in 12 days DR [facility] CELL [phone] KEEP HAND ELEVATED ABOVE HEART OK TO APPLY ICE TO OPERATIVE AREA CONTACT OFFICE IF ANY WORSENING PAIN OR CONCERNS.

## 2016-10-25 ENCOUNTER — Encounter (HOSPITAL_COMMUNITY): Payer: Self-pay | Admitting: Orthopedic Surgery

## 2016-11-12 ENCOUNTER — Other Ambulatory Visit: Payer: Self-pay | Admitting: Internal Medicine

## 2016-11-15 NOTE — Telephone Encounter (Signed)
Patient came into the office today inquiring on script.  Patient has enough for a day or two left.  Patient requesting script to be sent as soon as possible.

## 2016-12-21 LAB — COLOGUARD: COLOGUARD: NEGATIVE

## 2017-01-12 ENCOUNTER — Encounter: Payer: Self-pay | Admitting: Internal Medicine

## 2017-01-13 ENCOUNTER — Encounter: Payer: Self-pay | Admitting: Internal Medicine

## 2017-04-15 ENCOUNTER — Ambulatory Visit: Payer: Medicare Other | Admitting: Internal Medicine

## 2017-04-25 ENCOUNTER — Ambulatory Visit: Payer: Medicare Other | Admitting: Internal Medicine

## 2017-04-25 ENCOUNTER — Encounter: Payer: Self-pay | Admitting: Internal Medicine

## 2017-04-25 DIAGNOSIS — F338 Other recurrent depressive disorders: Secondary | ICD-10-CM

## 2017-04-25 DIAGNOSIS — R413 Other amnesia: Secondary | ICD-10-CM | POA: Diagnosis not present

## 2017-04-25 DIAGNOSIS — E034 Atrophy of thyroid (acquired): Secondary | ICD-10-CM | POA: Diagnosis not present

## 2017-04-25 DIAGNOSIS — D485 Neoplasm of uncertain behavior of skin: Secondary | ICD-10-CM | POA: Diagnosis not present

## 2017-04-25 DIAGNOSIS — N529 Male erectile dysfunction, unspecified: Secondary | ICD-10-CM

## 2017-04-25 NOTE — Assessment & Plan Note (Signed)
Cialis prn 

## 2017-04-25 NOTE — Assessment & Plan Note (Signed)
On Levothroid Labs 

## 2017-04-25 NOTE — Assessment & Plan Note (Signed)
Better now 

## 2017-04-25 NOTE — Assessment & Plan Note (Signed)
Better Stopped Aricept

## 2017-04-25 NOTE — Assessment & Plan Note (Signed)
Sch appt w/[facility]

## 2017-04-25 NOTE — Progress Notes (Signed)
Subjective:  Patient ID: Travis Morse, male    DOB: [birth year]  Age: 67 y.o. MRN: [id number]  CC: No chief complaint on file.   HPI ELDAR ROBITAILLE presents for B12 def, hypothyroidism, memory issues f/u C/o cough x 1 mo - resolving  Outpatient Medications Prior to Visit  Medication Sig Dispense Refill  . b complex vitamins tablet Take 1 tablet by mouth daily.    . Beet Root POWD Take by mouth 2 (two) times daily. Mixes 1 teasppon in 6 oz of water    . Cholecalciferol (VITAMIN D) 2000 units tablet Take 2,000 Units by mouth daily.    . Cyanocobalamin (VITAMIN B-12) 500 MCG SUBL 1 tab sl qd 100 tablet 3  . fish oil-omega-3 fatty acids 1000 MG capsule Take 4,000 mg by mouth 2 (two) times daily.     Marland Kitchen levothyroxine (SYNTHROID, LEVOTHROID) 25 MCG tablet TAKE 1 TABLET BY MOUTH ONCE DAILY 90 tablet 1  . Multiple Vitamin (MULTIVITAMIN WITH MINERALS) TABS tablet Take 1 tablet by mouth daily.    . tadalafil (CIALIS) 5 MG tablet Take 1-2 tablets (5-10 mg total) by mouth daily as needed for erectile dysfunction. 90 tablet 11  . TURMERIC PO Take 1 tablet by mouth 2 (two) times daily. Less than a teaspoonful -takes with tomato paste and pepper    . triamcinolone cream (KENALOG) 0.1 % Apply 1 application topically 2 (two) times daily. (Patient not taking: Reported on 10/22/2016) 30 g 1   No facility-administered medications prior to visit.     ROS Review of Systems  Constitutional: Negative for appetite change, fatigue and unexpected weight change.  HENT: Negative for congestion, nosebleeds, sneezing, sore throat and trouble swallowing.   Eyes: Negative for itching and visual disturbance.  Respiratory: Negative for cough.   Cardiovascular: Negative for chest pain, palpitations and leg swelling.  Gastrointestinal: Negative for abdominal distention, blood in stool, diarrhea and nausea.  Genitourinary: Negative for frequency and hematuria.  Musculoskeletal: Negative for back pain, gait  problem, joint swelling and neck pain.  Skin: Negative for rash.  Neurological: Negative for dizziness, tremors, speech difficulty and weakness.  Psychiatric/Behavioral: Negative for agitation, dysphoric mood and sleep disturbance. The patient is not nervous/anxious.     Objective:  BP 128/84 (BP Location: Left Arm, Patient Position: Sitting, Cuff Size: Normal)   Pulse (!) 57   Temp 98.4 F (36.9 C) (Oral)   Ht 5\' 10"  (1.778 m)   Wt 157 lb (71.2 kg)   SpO2 98%   BMI 22.53 kg/m   BP Readings from Last 3 Encounters:  04/25/17 128/84  10/23/16 132/72  10/18/16 138/84    Wt Readings from Last 3 Encounters:  04/25/17 157 lb (71.2 kg)  10/23/16 154 lb (69.9 kg)  10/18/16 154 lb (69.9 kg)    Physical Exam  Constitutional: He is oriented to person, place, and time. He appears well-developed. No distress.  NAD  HENT:  Mouth/Throat: Oropharynx is clear and moist.  Eyes: Conjunctivae are normal. Pupils are equal, round, and reactive to light.  Neck: Normal range of motion. No JVD present. No thyromegaly present.  Cardiovascular: Normal rate, regular rhythm, normal heart sounds and intact distal pulses. Exam reveals no gallop and no friction rub.  No murmur heard. Pulmonary/Chest: Effort normal and breath sounds normal. No respiratory distress. He has no wheezes. He has no rales. He exhibits no tenderness.  Abdominal: Soft. Bowel sounds are normal. He exhibits no distension and no mass. There is no  tenderness. There is no rebound and no guarding.  Musculoskeletal: Normal range of motion. He exhibits no edema or tenderness.  Lymphadenopathy:    He has no cervical adenopathy.  Neurological: He is alert and oriented to person, place, and time. He has normal reflexes. No cranial nerve deficit. He exhibits normal muscle tone. He displays a negative Romberg sign. Coordination and gait normal.  Skin: Skin is warm and dry. No rash noted.  Psychiatric: He has a normal mood and affect. His  behavior is normal. Judgment and thought content normal.  R hand deformity - post fx AK on R forehead  Lab Results  Component Value Date   WBC 3.5 (L) 10/15/2016   HGB 14.5 10/15/2016   HCT 42.1 10/15/2016   PLT 222.0 10/15/2016   GLUCOSE 99 10/15/2016   CHOL 162 10/15/2016   TRIG 41.0 10/15/2016   HDL 58.20 10/15/2016   LDLDIRECT 117.3 01/21/2011   LDLCALC 95 10/15/2016   ALT 20 10/15/2016   AST 33 10/15/2016   NA 139 10/15/2016   K 4.0 10/15/2016   CL 102 10/15/2016   CREATININE 0.78 10/15/2016   BUN 9 10/15/2016   CO2 32 10/15/2016   TSH 2.72 10/15/2016   PSA 0.83 10/15/2016    No results found.  Assessment & Plan:   There are no diagnoses linked to this encounter. I have discontinued Milagros Reap. Jacobs's triamcinolone cream. I am also having him maintain his fish oil-omega-3 fatty acids, Vitamin B-12, Beet Root, TURMERIC PO, tadalafil, multivitamin with minerals, b complex vitamins, Vitamin D, and levothyroxine.  No orders of the defined types were placed in this encounter.    Follow-up: No Follow-up on file.  Walker Kehr, MD

## 2017-04-26 ENCOUNTER — Other Ambulatory Visit (INDEPENDENT_AMBULATORY_CARE_PROVIDER_SITE_OTHER): Payer: Medicare Other

## 2017-04-26 DIAGNOSIS — R413 Other amnesia: Secondary | ICD-10-CM | POA: Diagnosis not present

## 2017-04-26 DIAGNOSIS — E034 Atrophy of thyroid (acquired): Secondary | ICD-10-CM

## 2017-04-27 ENCOUNTER — Other Ambulatory Visit: Payer: Self-pay | Admitting: Internal Medicine

## 2017-04-27 DIAGNOSIS — E034 Atrophy of thyroid (acquired): Secondary | ICD-10-CM

## 2017-04-27 LAB — BASIC METABOLIC PANEL
BUN: 11 mg/dL (ref 6–23)
CHLORIDE: 100 meq/L (ref 96–112)
CO2: 25 meq/L (ref 19–32)
CREATININE: 0.81 mg/dL (ref 0.40–1.50)
Calcium: 9.8 mg/dL (ref 8.4–10.5)
GFR: 100.93 mL/min (ref >60.00)
Glucose, Bld: 101 mg/dL — ABNORMAL HIGH (ref 70–99)
POTASSIUM: 4.1 meq/L (ref 3.5–5.1)
Sodium: 136 mEq/L (ref 135–145)

## 2017-04-27 LAB — TSH: TSH: 7.05 u[IU]/mL — AB (ref 0.35–4.50)

## 2017-04-27 MED ORDER — LEVOTHYROXINE SODIUM 50 MCG PO TABS: 50.0000 ug | ORAL_TABLET | Freq: Every day | ORAL | 11 refills | Status: DC

## 2017-04-29 ENCOUNTER — Encounter: Payer: Self-pay | Admitting: Internal Medicine

## 2017-06-10 ENCOUNTER — Encounter: Payer: Self-pay | Admitting: Internal Medicine

## 2017-06-27 ENCOUNTER — Other Ambulatory Visit (INDEPENDENT_AMBULATORY_CARE_PROVIDER_SITE_OTHER): Payer: Medicare Other

## 2017-06-27 DIAGNOSIS — E034 Atrophy of thyroid (acquired): Secondary | ICD-10-CM | POA: Diagnosis not present

## 2017-06-27 LAB — TSH: TSH: 4.55 u[IU]/mL — AB (ref 0.35–4.50)

## 2017-06-28 ENCOUNTER — Other Ambulatory Visit: Payer: Self-pay | Admitting: Internal Medicine

## 2017-06-28 MED ORDER — LEVOTHYROXINE SODIUM 75 MCG PO TABS
75.0000 ug | ORAL_TABLET | Freq: Every day | ORAL | 3 refills | Status: DC
Start: 2017-06-28 — End: 2017-10-17

## 2017-08-03 ENCOUNTER — Other Ambulatory Visit (INDEPENDENT_AMBULATORY_CARE_PROVIDER_SITE_OTHER): Payer: Medicare Other

## 2017-08-03 ENCOUNTER — Other Ambulatory Visit: Payer: Self-pay

## 2017-08-03 DIAGNOSIS — E039 Hypothyroidism, unspecified: Secondary | ICD-10-CM | POA: Diagnosis not present

## 2017-08-03 LAB — TSH: TSH: 2.21 u[IU]/mL (ref 0.35–4.50)

## 2017-08-05 ENCOUNTER — Ambulatory Visit (INDEPENDENT_AMBULATORY_CARE_PROVIDER_SITE_OTHER): Payer: Medicare Other

## 2017-08-05 VITALS — Wt 154.5 lb

## 2017-08-05 DIAGNOSIS — Z23 Encounter for immunization: Secondary | ICD-10-CM

## 2017-08-09 ENCOUNTER — Encounter: Payer: Self-pay | Admitting: Internal Medicine

## 2017-08-23 ENCOUNTER — Encounter: Payer: Self-pay | Admitting: Internal Medicine

## 2017-08-30 ENCOUNTER — Ambulatory Visit: Payer: Medicare Other | Admitting: Internal Medicine

## 2017-08-30 ENCOUNTER — Encounter: Payer: Self-pay | Admitting: Internal Medicine

## 2017-08-30 DIAGNOSIS — F489 Nonpsychotic mental disorder, unspecified: Secondary | ICD-10-CM | POA: Diagnosis not present

## 2017-08-30 DIAGNOSIS — E034 Atrophy of thyroid (acquired): Secondary | ICD-10-CM

## 2017-08-30 DIAGNOSIS — R4589 Other symptoms and signs involving emotional state: Secondary | ICD-10-CM | POA: Insufficient documentation

## 2017-08-30 MED ORDER — TADALAFIL 5 MG PO TABS: 5.0000 mg | ORAL_TABLET | Freq: Every day | ORAL | 11 refills | Status: DC | PRN

## 2017-08-30 NOTE — Patient Instructions (Signed)
Valerian root for anxiety, insomnia 

## 2017-08-30 NOTE — Progress Notes (Signed)
Subjective:  Patient ID: Travis Morse, male    DOB: September 14, 1950  Age: 67 y.o. MRN: [id number]  CC: No chief complaint on file.   HPI Travis Morse presents for travel to [place] C/o moodiness, urinary frequency Denies depression C/o wt loss  Outpatient Medications Prior to Visit  Medication Sig Dispense Refill  . b complex vitamins tablet Take 1 tablet by mouth daily.    . Beet Root POWD Take by mouth 2 (two) times daily. Mixes 1 teasppon in 6 oz of water    . Cholecalciferol (VITAMIN D) 2000 units tablet Take 2,000 Units by mouth daily.    . fish oil-omega-3 fatty acids 1000 MG capsule Take 4,000 mg by mouth 2 (two) times daily.     Marland Kitchen levothyroxine (SYNTHROID, LEVOTHROID) 75 MCG tablet Take 1 tablet (75 mcg total) by mouth daily. 90 tablet 3  . Multiple Vitamin (MULTIVITAMIN WITH MINERALS) TABS tablet Take 1 tablet by mouth daily.    . tadalafil (CIALIS) 5 MG tablet Take 1-2 tablets (5-10 mg total) by mouth daily as needed for erectile dysfunction. 90 tablet 11  . TURMERIC PO Take 1 tablet by mouth 2 (two) times daily. Less than a teaspoonful -takes with tomato paste and pepper    . Cyanocobalamin (VITAMIN B-12) 500 MCG SUBL 1 tab sl qd (Patient not taking: Reported on 08/30/2017) 100 tablet 3   No facility-administered medications prior to visit.     ROS: Review of Systems  Constitutional: Negative for appetite change, fatigue and unexpected weight change.  HENT: Negative for congestion, nosebleeds, sneezing, sore throat and trouble swallowing.   Eyes: Negative for itching and visual disturbance.  Respiratory: Negative for cough.   Cardiovascular: Negative for chest pain, palpitations and leg swelling.  Gastrointestinal: Negative for abdominal distention, blood in stool, diarrhea and nausea.  Genitourinary: Negative for frequency and hematuria.  Musculoskeletal: Negative for back pain, gait problem, joint swelling and neck pain.  Skin: Negative for rash.    Neurological: Negative for dizziness, tremors, speech difficulty and weakness.  Psychiatric/Behavioral: Positive for dysphoric mood. Negative for agitation, sleep disturbance and suicidal ideas. The patient is not nervous/anxious.     Objective:  BP 128/82 (BP Location: Left Arm, Patient Position: Sitting, Cuff Size: Normal)   Pulse 69   Temp 98.7 F (37.1 C) (Oral)   Ht 5\' 10"  (1.778 m)   Wt 151 lb (68.5 kg)   SpO2 99%   BMI 21.67 kg/m   BP Readings from Last 3 Encounters:  08/30/17 128/82  04/25/17 128/84  10/23/16 132/72    Wt Readings from Last 3 Encounters:  08/30/17 151 lb (68.5 kg)  08/05/17 154 lb 8 oz (70.1 kg)  04/25/17 157 lb (71.2 kg)    Physical Exam  Constitutional: He is oriented to person, place, and time. He appears well-developed. No distress.  NAD  HENT:  Mouth/Throat: Oropharynx is clear and moist.  Eyes: Pupils are equal, round, and reactive to light. Conjunctivae are normal.  Neck: Normal range of motion. No JVD present. No thyromegaly present.  Cardiovascular: Normal rate, regular rhythm, normal heart sounds and intact distal pulses. Exam reveals no gallop and no friction rub.  No murmur heard. Pulmonary/Chest: Effort normal and breath sounds normal. No respiratory distress. He has no wheezes. He has no rales. He exhibits no tenderness.  Abdominal: Soft. Bowel sounds are normal. He exhibits no distension and no mass. There is no tenderness. There is no rebound and no guarding.  Musculoskeletal: Normal  range of motion. He exhibits no edema or tenderness.  Lymphadenopathy:    He has no cervical adenopathy.  Neurological: He is alert and oriented to person, place, and time. He has normal reflexes. No cranial nerve deficit. He exhibits normal muscle tone. He displays a negative Romberg sign. Coordination and gait normal.  Skin: Skin is warm and dry. No rash noted.  Psychiatric: He has a normal mood and affect. His behavior is normal. Judgment and  thought content normal.    Lab Results  Component Value Date   WBC 3.5 (L) 10/15/2016   HGB 14.5 10/15/2016   HCT 42.1 10/15/2016   PLT 222.0 10/15/2016   GLUCOSE 101 (H) 04/26/2017   CHOL 162 10/15/2016   TRIG 41.0 10/15/2016   HDL 58.20 10/15/2016   LDLDIRECT 117.3 01/21/2011   LDLCALC 95 10/15/2016   ALT 20 10/15/2016   AST 33 10/15/2016   NA 136 04/26/2017   K 4.1 04/26/2017   CL 100 04/26/2017   CREATININE 0.81 04/26/2017   BUN 11 04/26/2017   CO2 25 04/26/2017   TSH 2.21 08/03/2017   PSA 0.83 10/15/2016    No results found.  Assessment & Plan:   There are no diagnoses linked to this encounter.   No orders of the defined types were placed in this encounter.    Follow-up: No follow-ups on file.  Walker Kehr, MD

## 2017-08-30 NOTE — Assessment & Plan Note (Signed)
discussed - he is happy w/life

## 2017-08-30 NOTE — Assessment & Plan Note (Signed)
Labs

## 2017-08-31 ENCOUNTER — Telehealth: Payer: Self-pay | Admitting: Internal Medicine

## 2017-08-31 NOTE — Telephone Encounter (Signed)
Copied from [place]. Topic: Quick Communication - See Telephone Encounter >> Aug 31, 2017  9:22 AM Vernona Rieger wrote: CRM for notification. See Telephone encounter for: 08/31/17.  Patient called his insurance to see if they cover the MMR Vaccine/Booster, he said that it is covered and would like that added to be done. Call back @ [phone]

## 2017-08-31 NOTE — Telephone Encounter (Signed)
Okay to schedule this patient to have this done?

## 2017-09-01 NOTE — Telephone Encounter (Signed)
Yes please. Thank you

## 2017-09-01 NOTE — Telephone Encounter (Signed)
Okay to add to nurse schedule?

## 2017-09-02 ENCOUNTER — Other Ambulatory Visit (INDEPENDENT_AMBULATORY_CARE_PROVIDER_SITE_OTHER): Payer: Medicare Other

## 2017-09-02 ENCOUNTER — Telehealth: Payer: Self-pay | Admitting: Internal Medicine

## 2017-09-02 ENCOUNTER — Encounter: Payer: Self-pay | Admitting: Internal Medicine

## 2017-09-02 DIAGNOSIS — E034 Atrophy of thyroid (acquired): Secondary | ICD-10-CM

## 2017-09-02 DIAGNOSIS — Z23 Encounter for immunization: Secondary | ICD-10-CM

## 2017-09-02 LAB — BASIC METABOLIC PANEL
BUN: 10 mg/dL (ref 6–23)
CO2: 29 meq/L (ref 19–32)
Calcium: 9.6 mg/dL (ref 8.4–10.5)
Chloride: 102 mEq/L (ref 96–112)
Creatinine, Ser: 0.66 mg/dL (ref 0.40–1.50)
GFR: 127.7 mL/min (ref >60.00)
Glucose, Bld: 109 mg/dL — ABNORMAL HIGH (ref 70–99)
Potassium: 4.1 mEq/L (ref 3.5–5.1)
Sodium: 139 mEq/L (ref 135–145)

## 2017-09-02 LAB — HEPATIC FUNCTION PANEL
ALT: 15 U/L (ref 0–53)
AST: 24 U/L (ref 0–37)
Albumin: 4.1 g/dL (ref 3.5–5.2)
Alkaline Phosphatase: 63 U/L (ref 39–117)
BILIRUBIN DIRECT: 0.1 mg/dL (ref 0.0–0.3)
BILIRUBIN TOTAL: 0.4 mg/dL (ref 0.2–1.2)
Total Protein: 7.5 g/dL (ref 6.0–8.3)

## 2017-09-02 LAB — T4, FREE: Free T4: 0.84 ng/dL (ref 0.60–1.60)

## 2017-09-02 LAB — TSH: TSH: 2.3 u[IU]/mL (ref 0.35–4.50)

## 2017-09-02 MED ORDER — TYPHOID VI POLYSACCHARIDE VACC 25 MCG/0.5ML IM SOLN: 0.5000 mL | Freq: Once | INTRAMUSCULAR | 0 refills | Status: AC

## 2017-09-02 MED ORDER — MEASLES, MUMPS & RUBELLA VAC ~~LOC~~ INJ: 0.5000 mL | INJECTION | Freq: Once | SUBCUTANEOUS | 0 refills | Status: AC

## 2017-09-02 NOTE — Telephone Encounter (Signed)
Copied from [place] [id number]. Topic: Quick Communication - Rx Refill/Question >> Sep 02, 2017 11:39 AM Boyd Kerbs wrote: Medication: Pt called saying he is needing vaccinations for MMR and typhoid [place] Asking to have prescriptions sent to [facility] in [place], [place]  Pt said the Typhoid shot takes several weeks to become effective and asking if can have sent today I did tell him it can take up to 3 days.   Has the patient contacted their pharmacy? Yes.   (Agent: If no, request that the patient contact the pharmacy for the refill.) (Agent: If yes, when and what did the pharmacy advise?)  Preferred Pharmacy (with phone number or street name):  [facility] [address] [place], [place] [address] [phone]   Agent: Please be advised that RX refills may take up to 3 business days. We ask that you follow-up with your pharmacy.

## 2017-09-02 NOTE — Telephone Encounter (Signed)
Pt sent mychart message and want RX sent to pharmacy

## 2017-09-02 NOTE — Telephone Encounter (Signed)
Already ordered in another encounter. 

## 2017-09-05 NOTE — Telephone Encounter (Signed)
Patient states the Typhoid injection is not available at the pharmacy. He states they do have in oral form. Requesting RX for the oral form .   [facility] [place], [place] - [place]         [phone] (Phone) [phone] (Fax)

## 2017-09-05 NOTE — Telephone Encounter (Signed)
Okay oral typhoid vaccine-use as directed. Thank you

## 2017-09-05 NOTE — Telephone Encounter (Signed)
Patient called and states that the Typhoid injection is not available at the pharmacy. He states that they do have that in Oral. Please send a new RX for the Typhoid in Oral form .   [facility] [place], [place] - [place] [phone] (Phone) [phone] (Fax)

## 2017-09-05 NOTE — Telephone Encounter (Signed)
Routing to dr plotnikov, please advise, thanks 

## 2017-09-06 MED ORDER — TYPHOID VACCINE PO CPDR: 1.0000 | DELAYED_RELEASE_CAPSULE | ORAL | 0 refills | Status: DC

## 2017-09-06 NOTE — Telephone Encounter (Signed)
Talked with pharmacist at [facility] dose is 4 capsules every other day, dispensing 4 capsules total---stacey has called in order to [place]

## 2017-09-06 NOTE — Telephone Encounter (Signed)
See patient email 6/28---rx clarification for oral typhoid already sent back to pharmacy

## 2017-09-06 NOTE — Telephone Encounter (Signed)
I called [facility] to give verbal auth for oral typhoid vaccine. See meds.

## 2017-09-06 NOTE — Telephone Encounter (Signed)
Ryan calling from [facility] a dosage change needs to be made to the Typhoid RX .Please call [facility] to get that recommended dosage change for this medication  . Patient is coming in today to pick up RX.   CB# [phone].

## 2017-09-06 NOTE — Addendum Note (Signed)
Addended by: Cresenciano Lick on: 09/06/2017 10:30 AM   Modules accepted: Orders

## 2017-10-17 ENCOUNTER — Ambulatory Visit (INDEPENDENT_AMBULATORY_CARE_PROVIDER_SITE_OTHER): Payer: Medicare Other | Admitting: Internal Medicine

## 2017-10-17 ENCOUNTER — Other Ambulatory Visit (INDEPENDENT_AMBULATORY_CARE_PROVIDER_SITE_OTHER): Payer: Medicare Other

## 2017-10-17 ENCOUNTER — Encounter: Payer: Self-pay | Admitting: Internal Medicine

## 2017-10-17 VITALS — BP 126/76 | HR 62 | Temp 98.4°F | Ht 70.0 in | Wt 157.0 lb

## 2017-10-17 DIAGNOSIS — N320 Bladder-neck obstruction: Secondary | ICD-10-CM

## 2017-10-17 DIAGNOSIS — Z0000 Encounter for general adult medical examination without abnormal findings: Secondary | ICD-10-CM

## 2017-10-17 DIAGNOSIS — L02612 Cutaneous abscess of left foot: Secondary | ICD-10-CM

## 2017-10-17 DIAGNOSIS — E785 Hyperlipidemia, unspecified: Secondary | ICD-10-CM

## 2017-10-17 DIAGNOSIS — Z0001 Encounter for general adult medical examination with abnormal findings: Secondary | ICD-10-CM | POA: Diagnosis not present

## 2017-10-17 DIAGNOSIS — R739 Hyperglycemia, unspecified: Secondary | ICD-10-CM

## 2017-10-17 LAB — URINALYSIS
Bilirubin Urine: NEGATIVE
Hgb urine dipstick: NEGATIVE
LEUKOCYTES UA: NEGATIVE
Nitrite: NEGATIVE
SPECIFIC GRAVITY, URINE: 1.015 (ref 1.000–1.030)
Total Protein, Urine: NEGATIVE
UROBILINOGEN UA: 0.2 (ref 0.0–1.0)
Urine Glucose: NEGATIVE
pH: 7 (ref 5.0–8.0)

## 2017-10-17 LAB — CBC WITH DIFFERENTIAL/PLATELET
BASOS ABS: 0 10*3/uL (ref 0.0–0.1)
Basophils Relative: 0.7 % (ref 0.0–3.0)
Eosinophils Absolute: 0.1 10*3/uL (ref 0.0–0.7)
Eosinophils Relative: 1.6 % (ref 0.0–5.0)
HCT: 41.2 % (ref 39.0–52.0)
HEMOGLOBIN: 14.3 g/dL (ref 13.0–17.0)
LYMPHS ABS: 1 10*3/uL (ref 0.7–4.0)
Lymphocytes Relative: 16.8 % (ref 12.0–46.0)
MCHC: 34.6 g/dL (ref 30.0–36.0)
MCV: 97.2 fl (ref 78.0–100.0)
MONO ABS: 0.5 10*3/uL (ref 0.1–1.0)
Monocytes Relative: 8.2 % (ref 3.0–12.0)
NEUTROS PCT: 72.7 % (ref 43.0–77.0)
Neutro Abs: 4.2 10*3/uL (ref 1.4–7.7)
Platelets: 246 10*3/uL (ref 150.0–400.0)
RBC: 4.24 Mil/uL (ref 4.22–5.81)
RDW: 12.3 % (ref 11.5–15.5)
WBC: 5.8 10*3/uL (ref 4.0–10.5)

## 2017-10-17 LAB — BASIC METABOLIC PANEL
BUN: 12 mg/dL (ref 6–23)
CHLORIDE: 102 meq/L (ref 96–112)
CO2: 30 meq/L (ref 19–32)
Calcium: 9.5 mg/dL (ref 8.4–10.5)
Creatinine, Ser: 0.75 mg/dL (ref 0.40–1.50)
GFR: 110.15 mL/min (ref >60.00)
Glucose, Bld: 110 mg/dL — ABNORMAL HIGH (ref 70–99)
Potassium: 4 mEq/L (ref 3.5–5.1)
SODIUM: 139 meq/L (ref 135–145)

## 2017-10-17 LAB — PSA: PSA: 1.3 ng/mL (ref 0.10–4.00)

## 2017-10-17 LAB — LIPID PANEL
CHOLESTEROL: 151 mg/dL (ref 0–200)
HDL: 53.9 mg/dL (ref >39.00)
LDL CALC: 77 mg/dL (ref 0–99)
NonHDL: 96.63
Total CHOL/HDL Ratio: 3
Triglycerides: 100 mg/dL (ref 0.0–149.0)
VLDL: 20 mg/dL (ref 0.0–40.0)

## 2017-10-17 LAB — HEMOGLOBIN A1C: HEMOGLOBIN A1C: 5.6 % (ref 4.6–6.5)

## 2017-10-17 MED ORDER — DOXYCYCLINE HYCLATE 100 MG PO TABS: 100.0000 mg | ORAL_TABLET | Freq: Two times a day (BID) | ORAL | 1 refills | Status: DC

## 2017-10-17 MED ORDER — MUPIROCIN 2 % EX OINT: TOPICAL_OINTMENT | CUTANEOUS | 0 refills | Status: DC

## 2017-10-17 NOTE — Patient Instructions (Addendum)
Wound instructions provided. Wash with soap and water, pat dry, re-dress.   Please contact us if you notice a recollection of pus in the abscess fever and chills increased pain redness red streaks near the abscess increased swelling in the area.

## 2017-10-17 NOTE — Assessment & Plan Note (Signed)
I&D Doxy po Mupirocin

## 2017-10-17 NOTE — Progress Notes (Signed)
Subjective:  Patient ID: Travis Morse, male    DOB: [birth year]  Age: 67 y.o. MRN: [id number]  CC: No chief complaint on file.   HPI DAISEAN BRODHEAD presents for a well exam C/o toe pain and swelling x4-5 d - dancing a lot  Outpatient Medications Prior to Visit  Medication Sig Dispense Refill  . b complex vitamins tablet Take 1 tablet by mouth daily.    . Beet Root POWD Take by mouth 2 (two) times daily. Mixes 1 teasppon in 6 oz of water    . Cholecalciferol (VITAMIN D) 2000 units tablet Take 2,000 Units by mouth daily.    . fish oil-omega-3 fatty acids 1000 MG capsule Take 4,000 mg by mouth 2 (two) times daily.     Marland Kitchen levothyroxine (SYNTHROID, LEVOTHROID) 50 MCG tablet Take 50 mcg by mouth daily.  11  . Multiple Vitamin (MULTIVITAMIN WITH MINERALS) TABS tablet Take 1 tablet by mouth daily.    . tadalafil (CIALIS) 5 MG tablet Take 1-2 tablets (5-10 mg total) by mouth daily as needed for erectile dysfunction. 90 tablet 11  . TURMERIC PO Take 1 tablet by mouth 2 (two) times daily. Less than a teaspoonful -takes with tomato paste and pepper    . typhoid (VIVOTIF) DR capsule Take 1 capsule by mouth every other day. 4 capsule 0  . levothyroxine (SYNTHROID, LEVOTHROID) 75 MCG tablet Take 1 tablet (75 mcg total) by mouth daily. 90 tablet 3   No facility-administered medications prior to visit.     ROS: Review of Systems  Constitutional: Negative for appetite change, fatigue and unexpected weight change.  HENT: Negative for congestion, nosebleeds, sneezing, sore throat and trouble swallowing.   Eyes: Negative for itching and visual disturbance.  Respiratory: Negative for cough.   Cardiovascular: Negative for chest pain, palpitations and leg swelling.  Gastrointestinal: Negative for abdominal distention, blood in stool, diarrhea and nausea.  Genitourinary: Negative for frequency and hematuria.  Musculoskeletal: Negative for back pain, gait problem, joint swelling and neck pain.    Skin: Negative for rash.  Neurological: Negative for dizziness, tremors, speech difficulty and weakness.  Psychiatric/Behavioral: Negative for agitation, dysphoric mood and sleep disturbance. The patient is not nervous/anxious.     Objective:  BP 126/76 (BP Location: Left Arm, Patient Position: Sitting, Cuff Size: Normal)   Pulse 62   Temp 98.4 F (36.9 C) (Oral)   Ht 5\' 10"  (1.778 m)   Wt 157 lb (71.2 kg)   SpO2 96%   BMI 22.53 kg/m   BP Readings from Last 3 Encounters:  10/17/17 126/76  08/30/17 128/82  04/25/17 128/84    Wt Readings from Last 3 Encounters:  10/17/17 157 lb (71.2 kg)  08/30/17 151 lb (68.5 kg)  08/05/17 154 lb 8 oz (70.1 kg)    Physical Exam  Constitutional: He is oriented to person, place, and time. He appears well-developed. No distress.  NAD  HENT:  Mouth/Throat: Oropharynx is clear and moist.  Eyes: Pupils are equal, round, and reactive to light. Conjunctivae are normal.  Neck: Normal range of motion. No JVD present. No thyromegaly present.  Cardiovascular: Normal rate, regular rhythm, normal heart sounds and intact distal pulses. Exam reveals no gallop and no friction rub.  No murmur heard. Pulmonary/Chest: Effort normal and breath sounds normal. No respiratory distress. He has no wheezes. He has no rales. He exhibits no tenderness.  Abdominal: Soft. Bowel sounds are normal. He exhibits no distension and no mass. There is no tenderness.  There is no rebound and no guarding.  Genitourinary: Rectum normal and prostate normal.  Musculoskeletal: Normal range of motion. He exhibits no edema or tenderness.  Lymphadenopathy:    He has no cervical adenopathy.  Neurological: He is alert and oriented to person, place, and time. He has normal reflexes. No cranial nerve deficit. He exhibits normal muscle tone. He displays a negative Romberg sign. Coordination and gait normal.  Skin: Skin is warm and dry. No rash noted.  Psychiatric: He has a normal mood and  affect. His behavior is normal. Judgment and thought content normal.  L 2nd toe abscess  Incision and Drainage of an abscess   Indication : a localized collection of pus that is tender and not spontaneously resolving - toe L 2nd.    Risks including unsuccessful procedure , possible need for a repeat procedure due to pus accumulation, scar formation, and others as well as benefits were explained to the patient in detail.Verbal consent was obtained/signed.     The area of the finger abscess was prepped with povidone-iodine. Paronychia was incised. About 1/4 cc of purulent material was expressed.The cavity was cleaned.   The wound was dressed with antibiotic ointment and a bandaid.  Tolerated well. Complications: None.   Wound instructions provided.    Please contact us if you notice a recollection of pus in the abscess fever and chills increased pain redness red streaks near the abscess increased swelling in the area.    Lab Results  Component Value Date   WBC 3.5 (L) 10/15/2016   HGB 14.5 10/15/2016   HCT 42.1 10/15/2016   PLT 222.0 10/15/2016   GLUCOSE 109 (H) 09/02/2017   CHOL 162 10/15/2016   TRIG 41.0 10/15/2016   HDL 58.20 10/15/2016   LDLDIRECT 117.3 01/21/2011   LDLCALC 95 10/15/2016   ALT 15 09/02/2017   AST 24 09/02/2017   NA 139 09/02/2017   K 4.1 09/02/2017   CL 102 09/02/2017   CREATININE 0.66 09/02/2017   BUN 10 09/02/2017   CO2 29 09/02/2017   TSH 2.30 09/02/2017   PSA 0.83 10/15/2016    No results found.  Assessment & Plan:   There are no diagnoses linked to this encounter.   No orders of the defined types were placed in this encounter.    Follow-up: No follow-ups on file.  Walker Kehr, MD

## 2017-10-17 NOTE — Assessment & Plan Note (Signed)
Here for medicare wellness/physical  Diet: heart healthy  Physical activity: not sedentary  Depression/mood screen: negative  Hearing: intact to whispered voice  Visual acuity: grossly normal, performs annual eye exam  ADLs: capable  Fall risk: low to none  Home safety: good  Cognitive evaluation: intact to orientation, naming, recall and repetition  EOL planning: adv directives, full code/ I agree  I have personally reviewed and have noted  1. The patient's medical, surgical and social history  2. Their use of alcohol, tobacco or illicit drugs  3. Their current medications and supplements  4. The patient's functional ability including ADL's, fall risks, home safety risks and hearing or visual impairment.  5. Diet and physical activities  6. Evidence for depression or mood disorders 7. The roster of all physicians providing medical care to patient - is listed in the Snapshot section of the chart and reviewed today.    Today patient counseled on age appropriate routine health concerns for screening and prevention, each reviewed and up to date or declined. Immunizations reviewed and up to date or declined. Labs ordered and reviewed. Risk factors for depression reviewed and negative. Hearing function and visual acuity are intact. ADLs screened and addressed as needed. Functional ability and level of safety reviewed and appropriate. Education, counseling and referrals performed based on assessed risks today. Patient provided with a copy of personalized plan for preventive services.   Colon was due in 2016

## 2017-12-30 ENCOUNTER — Other Ambulatory Visit: Payer: Self-pay | Admitting: Internal Medicine

## 2018-05-17 ENCOUNTER — Encounter: Payer: Self-pay | Admitting: Internal Medicine

## 2018-05-17 ENCOUNTER — Other Ambulatory Visit: Payer: Self-pay

## 2018-05-17 ENCOUNTER — Ambulatory Visit (INDEPENDENT_AMBULATORY_CARE_PROVIDER_SITE_OTHER): Payer: Medicare Other | Admitting: Internal Medicine

## 2018-05-17 VITALS — BP 120/84 | HR 56 | Temp 98.1°F | Ht 70.0 in | Wt 153.0 lb

## 2018-05-17 DIAGNOSIS — E785 Hyperlipidemia, unspecified: Secondary | ICD-10-CM

## 2018-05-17 DIAGNOSIS — E034 Atrophy of thyroid (acquired): Secondary | ICD-10-CM | POA: Diagnosis not present

## 2018-05-17 DIAGNOSIS — Z0000 Encounter for general adult medical examination without abnormal findings: Secondary | ICD-10-CM | POA: Diagnosis not present

## 2018-05-17 DIAGNOSIS — R05 Cough: Secondary | ICD-10-CM | POA: Diagnosis not present

## 2018-05-17 DIAGNOSIS — R059 Cough, unspecified: Secondary | ICD-10-CM

## 2018-05-17 MED ORDER — TADALAFIL 5 MG PO TABS: 5.0000 mg | ORAL_TABLET | Freq: Every day | ORAL | 11 refills | Status: DC | PRN

## 2018-05-17 NOTE — Assessment & Plan Note (Addendum)
Seasonal  Claritin A cardiac CT scan for coronary calcium offered

## 2018-05-17 NOTE — Patient Instructions (Signed)

## 2018-05-17 NOTE — Progress Notes (Signed)
Subjective:  Patient ID: Travis Morse, male    DOB: Apr 13, 1950  Age: 68 y.o. MRN: [id number]  CC: No chief complaint on file.   HPI Travis Morse presents for skin cancer on the face - recurrent. F/u hypothyroidism. C/o mild cough  Outpatient Medications Prior to Visit  Medication Sig Dispense Refill  . b complex vitamins tablet Take 1 tablet by mouth daily.    . Beet Root POWD Take by mouth 2 (two) times daily. Mixes 1 teasppon in 6 oz of water    . Cholecalciferol (VITAMIN D) 2000 units tablet Take 2,000 Units by mouth daily.    Marland Kitchen doxycycline (VIBRA-TABS) 100 MG tablet Take 1 tablet (100 mg total) by mouth 2 (two) times daily. 14 tablet 1  . fish oil-omega-3 fatty acids 1000 MG capsule Take 4,000 mg by mouth 2 (two) times daily.     Marland Kitchen levothyroxine (SYNTHROID, LEVOTHROID) 50 MCG tablet TAKE 1 TABLET BY MOUTH ONCE DAILY 90 tablet 3  . Multiple Vitamin (MULTIVITAMIN WITH MINERALS) TABS tablet Take 1 tablet by mouth daily.    . mupirocin ointment (BACTROBAN) 2 % Use qd-bid 15 g 0  . tadalafil (CIALIS) 5 MG tablet Take 1-2 tablets (5-10 mg total) by mouth daily as needed for erectile dysfunction. 90 tablet 11  . TURMERIC PO Take 1 tablet by mouth 2 (two) times daily. Less than a teaspoonful -takes with tomato paste and pepper    . typhoid (VIVOTIF) DR capsule Take 1 capsule by mouth every other day. 4 capsule 0  . levothyroxine (SYNTHROID, LEVOTHROID) 50 MCG tablet Take 50 mcg by mouth daily.  11   No facility-administered medications prior to visit.     ROS: Review of Systems  Constitutional: Negative for appetite change, fatigue and unexpected weight change.  HENT: Negative for congestion, nosebleeds, sneezing, sore throat and trouble swallowing.   Eyes: Negative for itching and visual disturbance.  Respiratory: Negative for cough.   Cardiovascular: Negative for chest pain, palpitations and leg swelling.  Gastrointestinal: Negative for abdominal distention, blood in  stool, diarrhea and nausea.  Genitourinary: Negative for frequency and hematuria.  Musculoskeletal: Negative for back pain, gait problem, joint swelling and neck pain.  Skin: Positive for color change and rash. Negative for wound.  Neurological: Negative for dizziness, tremors, speech difficulty and weakness.  Psychiatric/Behavioral: Negative for agitation, dysphoric mood and sleep disturbance. The patient is not nervous/anxious.     Objective:  BP 120/84 (BP Location: Left Arm, Patient Position: Sitting, Cuff Size: Normal)   Pulse (!) 56   Temp 98.1 F (36.7 C) (Oral)   Ht 5\' 10"  (1.778 m)   Wt 153 lb (69.4 kg)   SpO2 96%   BMI 21.95 kg/m   BP Readings from Last 3 Encounters:  05/17/18 120/84  10/17/17 126/76  08/30/17 128/82    Wt Readings from Last 3 Encounters:  05/17/18 153 lb (69.4 kg)  10/17/17 157 lb (71.2 kg)  08/30/17 151 lb (68.5 kg)    Physical Exam Constitutional:      General: He is not in acute distress.    Appearance: He is well-developed.     Comments: NAD  Eyes:     Conjunctiva/sclera: Conjunctivae normal.     Pupils: Pupils are equal, round, and reactive to light.  Neck:     Musculoskeletal: Normal range of motion.     Thyroid: No thyromegaly.     Vascular: No JVD.  Cardiovascular:     Rate and Rhythm:  Normal rate and regular rhythm.     Heart sounds: Normal heart sounds. No murmur. No friction rub. No gallop.   Pulmonary:     Effort: Pulmonary effort is normal. No respiratory distress.     Breath sounds: Normal breath sounds. No wheezing or rales.  Chest:     Chest wall: No tenderness.  Abdominal:     General: Bowel sounds are normal. There is no distension.     Palpations: Abdomen is soft. There is no mass.     Tenderness: There is no abdominal tenderness. There is no guarding or rebound.  Musculoskeletal: Normal range of motion.        General: No tenderness.  Lymphadenopathy:     Cervical: No cervical adenopathy.  Skin:    General:  Skin is warm and dry.     Findings: No rash.  Neurological:     Mental Status: He is alert and oriented to person, place, and time.     Cranial Nerves: No cranial nerve deficit.     Motor: No abnormal muscle tone.     Coordination: Coordination normal.     Gait: Gait normal.     Deep Tendon Reflexes: Reflexes are normal and symmetric.  Psychiatric:        Behavior: Behavior normal.        Thought Content: Thought content normal.        Judgment: Judgment normal.   face w/erythema gingivitis  Lab Results  Component Value Date   WBC 5.8 10/17/2017   HGB 14.3 10/17/2017   HCT 41.2 10/17/2017   PLT 246.0 10/17/2017   GLUCOSE 110 (H) 10/17/2017   CHOL 151 10/17/2017   TRIG 100.0 10/17/2017   HDL 53.90 10/17/2017   LDLDIRECT 117.3 01/21/2011   LDLCALC 77 10/17/2017   ALT 15 09/02/2017   AST 24 09/02/2017   NA 139 10/17/2017   K 4.0 10/17/2017   CL 102 10/17/2017   CREATININE 0.75 10/17/2017   BUN 12 10/17/2017   CO2 30 10/17/2017   TSH 2.30 09/02/2017   PSA 1.30 10/17/2017   HGBA1C 5.6 10/17/2017    No results found.  Assessment & Plan:   There are no diagnoses linked to this encounter.   No orders of the defined types were placed in this encounter.    Follow-up: No follow-ups on file.  Walker Kehr, MD

## 2018-05-17 NOTE — Assessment & Plan Note (Signed)
A cardiac CT scan for coronary calcium offered 3/20 

## 2018-07-11 ENCOUNTER — Inpatient Hospital Stay: Admission: RE | Admit: 2018-07-11 | Payer: Medicare Other | Source: Ambulatory Visit

## 2018-07-12 ENCOUNTER — Other Ambulatory Visit: Payer: Medicare Other

## 2018-07-19 ENCOUNTER — Other Ambulatory Visit: Payer: Medicare Other

## 2018-07-26 NOTE — Telephone Encounter (Signed)
Left message for patient to call back to schedule.  °

## 2018-07-27 ENCOUNTER — Ambulatory Visit (INDEPENDENT_AMBULATORY_CARE_PROVIDER_SITE_OTHER): Payer: Medicare Other | Admitting: Internal Medicine

## 2018-07-27 ENCOUNTER — Encounter: Payer: Self-pay | Admitting: Internal Medicine

## 2018-07-27 DIAGNOSIS — E034 Atrophy of thyroid (acquired): Secondary | ICD-10-CM

## 2018-07-27 DIAGNOSIS — R413 Other amnesia: Secondary | ICD-10-CM

## 2018-07-27 DIAGNOSIS — R519 Headache, unspecified: Secondary | ICD-10-CM | POA: Insufficient documentation

## 2018-07-27 DIAGNOSIS — R51 Headache: Secondary | ICD-10-CM | POA: Diagnosis not present

## 2018-07-27 MED ORDER — DOXYCYCLINE HYCLATE 100 MG PO TABS
100.0000 mg | ORAL_TABLET | Freq: Two times a day (BID) | ORAL | 0 refills | Status: DC
Start: 2018-07-27 — End: 2018-09-22

## 2018-07-27 NOTE — Assessment & Plan Note (Signed)
Doing fair 

## 2018-07-27 NOTE — Progress Notes (Signed)
Virtual Visit via Video Note  I connected with Travis Morse on 07/27/18 at  9:30 AM EDT by a video enabled telemedicine application and verified that I am speaking with the correct person using two identifiers.   I discussed the limitations of evaluation and management by telemedicine and the availability of in person appointments. The patient expressed understanding and agreed to proceed.  History of Present Illness: We need to follow-up on allergist, hypothyroidism, ED  The patient is complaining of almost daily headaches all 1 month duration they usually mild 1-3/10 in intensity.  Every 2 to 3 days he has.  When there is no headache.  Thanks defibrillated to D[employer].  He did have a chance to check his dental visits due to [place] situation.  Is been having nasal discharge yellow at times.  Not taking any antiallergy meds.  No fever chills, neurologic signs. There has been no  cough, chest pain, shortness of breath, abdominal pain, diarrhea, constipation, arthralgias, skin rashes.   Observations/Objective: The patient appears to be in no acute distress, looks well.  Assessment and Plan:  See my Assessment and Plan. Follow Up Instructions:    I discussed the assessment and treatment plan with the patient. The patient was provided an opportunity to ask questions and all were answered. The patient agreed with the plan and demonstrated an understanding of the instructions.   The patient was advised to call back or seek an in-person evaluation if the symptoms worsen or if the condition fails to improve as anticipated.  I provided face-to-face time during this encounter. We were at different locations.   Walker Kehr, MD

## 2018-07-27 NOTE — Assessment & Plan Note (Signed)
Continue Levothroid

## 2018-07-27 NOTE — Assessment & Plan Note (Addendum)
unclear etiology.  Possible sinusitis- will treat with doxycycline empirically Trial of Claritin Schedule a dental appointment Consider sinus CT if not better

## 2018-08-15 IMAGING — DX DG FINGER THUMB 2+V*R*
3 series · 3 of 3 positions shown · non-contrast
Comparison: None.

CLINICAL DATA: Thumb pain.  Recent skiing injury

EXAM:
RIGHT THUMB 2+V

[finger ap]
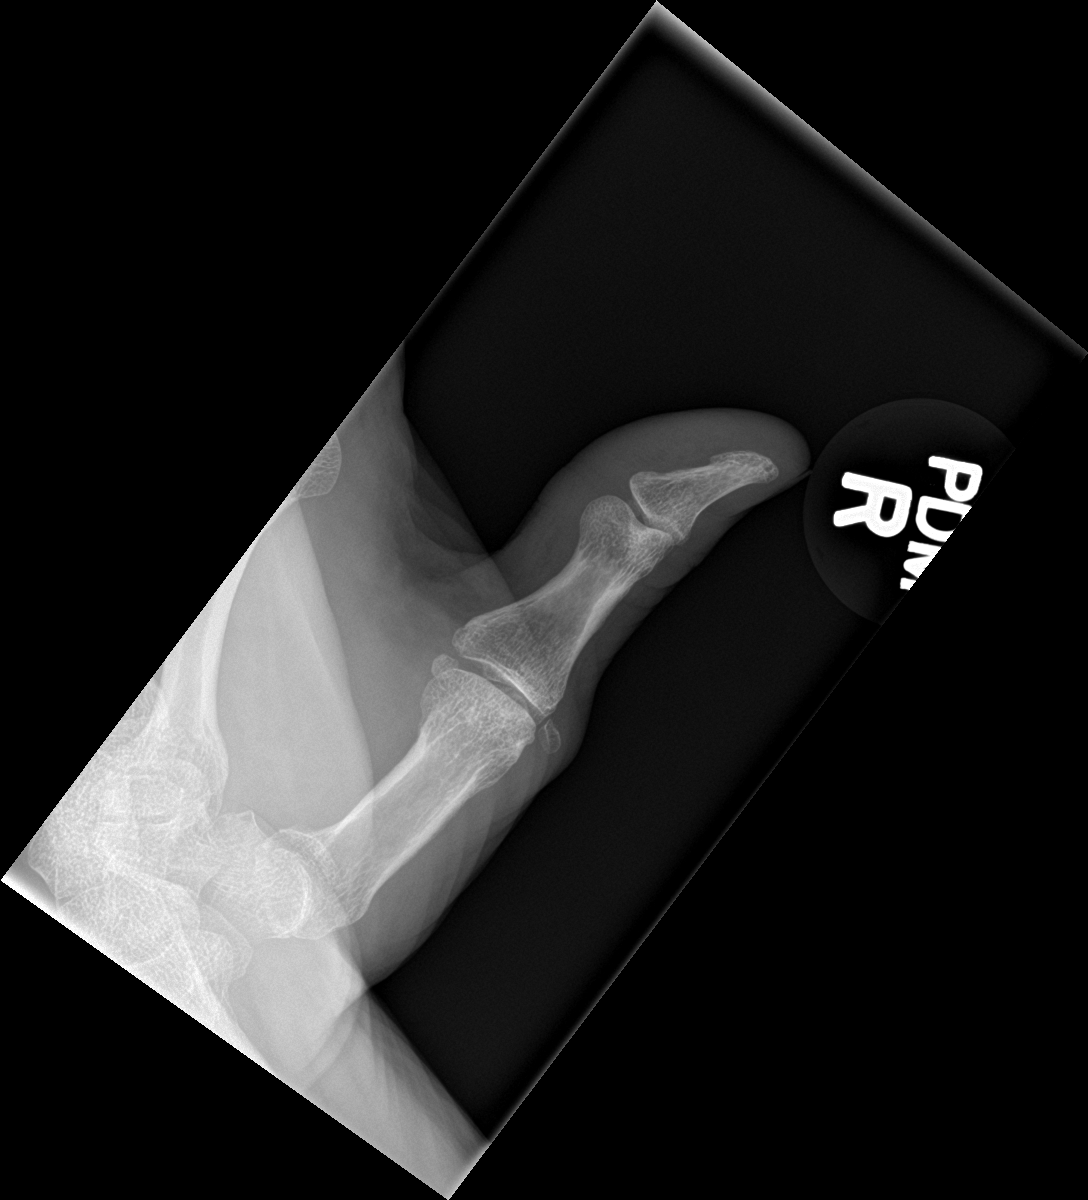

[finger obl]
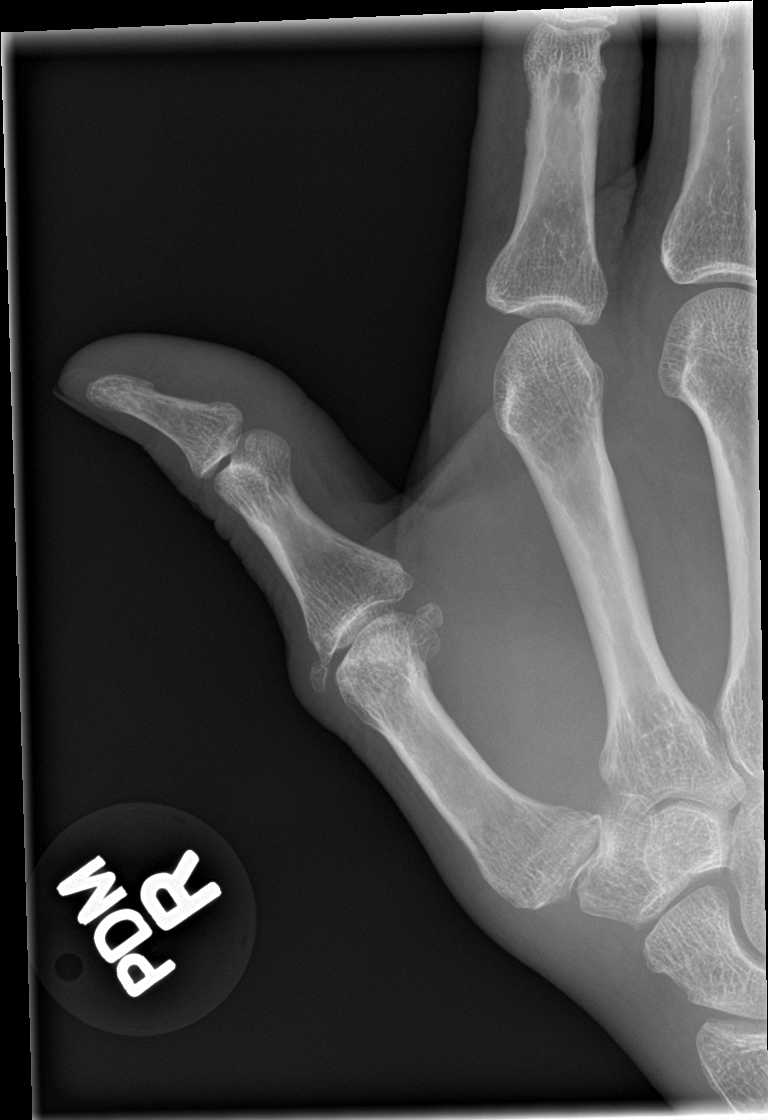

[finger lat]
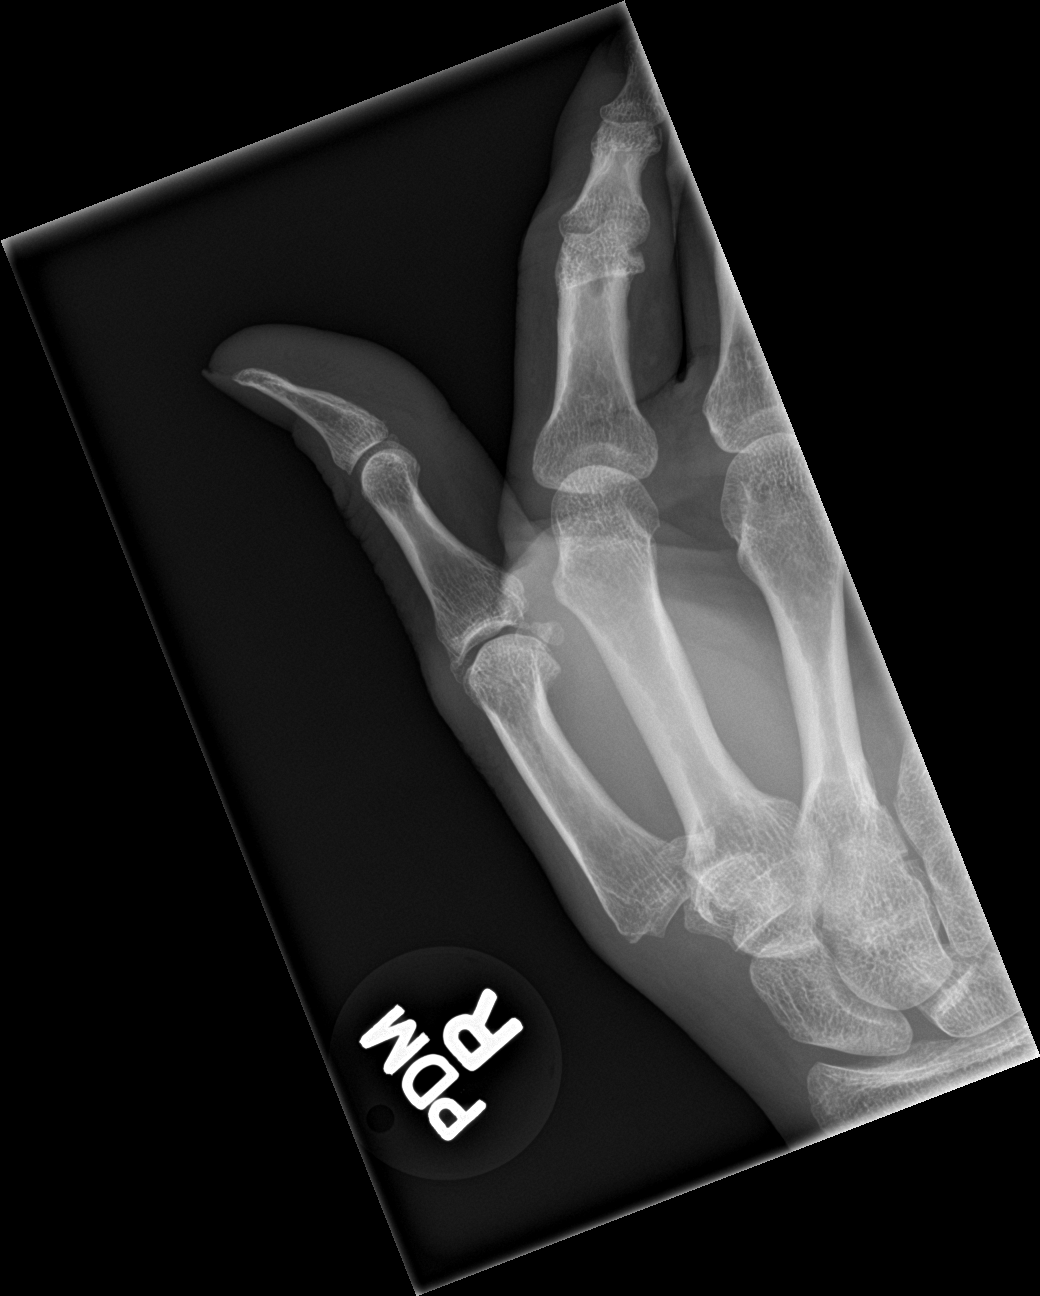

[3 of 3 positions shown; findings below may reference images not displayed]

FINDINGS: Fracture at the base of the first metacarpal extending into the
carpal metacarpal joint. Mild displacement. Mild subluxation of the
first carpometacarpal joint.

Mild degenerate change in the first MCP
IMPRESSION: Intraarticular fracture base of the first metacarpal.

## 2018-08-22 ENCOUNTER — Other Ambulatory Visit: Payer: Self-pay

## 2018-08-22 ENCOUNTER — Inpatient Hospital Stay: Admission: RE | Admit: 2018-08-22 | Payer: Medicare Other | Source: Ambulatory Visit

## 2018-08-23 ENCOUNTER — Other Ambulatory Visit: Payer: Self-pay | Admitting: Internal Medicine

## 2018-08-23 DIAGNOSIS — R519 Headache, unspecified: Secondary | ICD-10-CM

## 2018-09-12 ENCOUNTER — Telehealth: Payer: Self-pay | Admitting: *Deleted

## 2018-09-12 NOTE — Telephone Encounter (Signed)

## 2018-09-13 ENCOUNTER — Ambulatory Visit
Admission: RE | Admit: 2018-09-13 | Discharge: 2018-09-13 | Disposition: A | Discharge status: Home / Self Care | Payer: Self-pay | Source: Ambulatory Visit | Attending: Internal Medicine | Admitting: Internal Medicine

## 2018-09-13 ENCOUNTER — Other Ambulatory Visit: Payer: Self-pay

## 2018-09-13 ENCOUNTER — Ambulatory Visit (INDEPENDENT_AMBULATORY_CARE_PROVIDER_SITE_OTHER)
Admission: RE | Admit: 2018-09-13 | Discharge: 2018-09-13 | Disposition: A | Discharge status: Home / Self Care | Payer: Medicare Other | Source: Ambulatory Visit | Attending: Internal Medicine | Admitting: Internal Medicine

## 2018-09-13 DIAGNOSIS — R51 Headache: Secondary | ICD-10-CM

## 2018-09-13 DIAGNOSIS — E785 Hyperlipidemia, unspecified: Secondary | ICD-10-CM

## 2018-09-13 DIAGNOSIS — G8929 Other chronic pain: Secondary | ICD-10-CM

## 2018-09-14 ENCOUNTER — Ambulatory Visit (INDEPENDENT_AMBULATORY_CARE_PROVIDER_SITE_OTHER): Payer: Medicare Other | Admitting: Internal Medicine

## 2018-09-14 ENCOUNTER — Telehealth: Payer: Self-pay | Admitting: Internal Medicine

## 2018-09-14 ENCOUNTER — Encounter: Payer: Self-pay | Admitting: Internal Medicine

## 2018-09-14 DIAGNOSIS — I2510 Atherosclerotic heart disease of native coronary artery without angina pectoris: Secondary | ICD-10-CM

## 2018-09-14 DIAGNOSIS — I2583 Coronary atherosclerosis due to lipid rich plaque: Secondary | ICD-10-CM | POA: Diagnosis not present

## 2018-09-14 DIAGNOSIS — J322 Chronic ethmoidal sinusitis: Secondary | ICD-10-CM

## 2018-09-14 DIAGNOSIS — J329 Chronic sinusitis, unspecified: Secondary | ICD-10-CM | POA: Insufficient documentation

## 2018-09-14 MED ORDER — CEFDINIR 300 MG PO CAPS: 300.0000 mg | ORAL_CAPSULE | Freq: Two times a day (BID) | ORAL | 0 refills | Status: DC

## 2018-09-14 MED ORDER — ASPIRIN EC 81 MG PO TBEC: 81.0000 mg | DELAYED_RELEASE_TABLET | Freq: Every day | ORAL | 3 refills | Status: AC

## 2018-09-14 NOTE — Assessment & Plan Note (Signed)
ethmoid by CT Cefdinir x 3 wks

## 2018-09-14 NOTE — Assessment & Plan Note (Signed)
7/20 coronary calcium CT score is 660 ASA Statins discussed Cardiol ref Moderate exercise

## 2018-09-14 NOTE — Telephone Encounter (Signed)
Pt had VOV today and went over results during visit

## 2018-09-14 NOTE — Telephone Encounter (Signed)
Patient is calling back for CT results Please advise Cb- [phone]

## 2018-09-14 NOTE — Progress Notes (Signed)
Virtual Visit via Video Note  I connected with Travis Morse on 09/14/18 at 11:40 AM EDT by a video enabled telemedicine application and verified that I am speaking with the correct person using two identifiers.   I discussed the limitations of evaluation and management by telemedicine and the availability of in person appointments. The patient expressed understanding and agreed to proceed.  History of Present Illness: We need to follow-up on abn CT of sinuses, HAs and abn coronary calcium CT. No CP  There has been no runny nose, cough, chest pain, shortness of breath, abdominal pain, diarrhea, constipation, arthralgias, skin rashes.   Observations/Objective: The patient appears to be in no acute distress, looks well.  Assessment and Plan:  See my Assessment and Plan. Follow Up Instructions:    I discussed the assessment and treatment plan with the patient. The patient was provided an opportunity to ask questions and all were answered. The patient agreed with the plan and demonstrated an understanding of the instructions.   The patient was advised to call back or seek an in-person evaluation if the symptoms worsen or if the condition fails to improve as anticipated.  I provided face-to-face time during this encounter. We were at different locations.   Walker Kehr, MD

## 2018-09-21 ENCOUNTER — Other Ambulatory Visit: Payer: Self-pay | Admitting: Internal Medicine

## 2018-09-21 DIAGNOSIS — R931 Abnormal findings on diagnostic imaging of heart and coronary circulation: Secondary | ICD-10-CM | POA: Insufficient documentation

## 2018-09-22 ENCOUNTER — Other Ambulatory Visit: Payer: Self-pay

## 2018-09-22 ENCOUNTER — Ambulatory Visit (INDEPENDENT_AMBULATORY_CARE_PROVIDER_SITE_OTHER): Payer: Medicare Other | Admitting: Family

## 2018-09-22 ENCOUNTER — Encounter: Payer: Self-pay | Admitting: Family

## 2018-09-22 ENCOUNTER — Ambulatory Visit: Payer: Self-pay | Admitting: Internal Medicine

## 2018-09-22 DIAGNOSIS — Z20828 Contact with and (suspected) exposure to other viral communicable diseases: Secondary | ICD-10-CM | POA: Diagnosis not present

## 2018-09-22 DIAGNOSIS — R509 Fever, unspecified: Secondary | ICD-10-CM | POA: Diagnosis not present

## 2018-09-22 DIAGNOSIS — M791 Myalgia, unspecified site: Secondary | ICD-10-CM | POA: Diagnosis not present

## 2018-09-22 DIAGNOSIS — Z20822 Contact with and (suspected) exposure to covid-19: Secondary | ICD-10-CM

## 2018-09-22 NOTE — Telephone Encounter (Signed)
appt with Valere Dross today.

## 2018-09-22 NOTE — Progress Notes (Signed)
Travis Morse is a 68 y.o. male with the following history as recorded in [employer]:  Patient Active Problem List   Diagnosis Date Noted  . [place] coronary artery calcium score greater than 400 09/21/2018  . Coronary atherosclerosis 09/14/2018  . Chronic sinusitis 09/14/2018  . Headache 07/27/2018  . Cough in adult 05/17/2018  . Toe abscess, left 10/17/2017  . Moody 08/30/2017  . Neoplasm of uncertain behavior of skin 04/25/2017  . Thumb injury, right, initial encounter 10/19/2016  . Thumb pain, left 10/14/2016  . Acute upper respiratory infection 03/22/2016  . Impacted cerumen, right ear 03/22/2016  . Paresthesia 08/27/2014  . Dark urine 12/14/2013  . Memory changes 09/14/2013  . Closed fracture of right tibial plateau 03/15/2013  . Posterior tibial tendinitis of left leg 06/22/2012  . Arthralgia 07/26/2011  . Well adult exam 01/26/2011  . INSOMNIA, PERSISTENT 07/16/2008  . ERECTILE DYSFUNCTION 06/27/2007  . EDEMA 06/27/2007  . COLONIC POLYPS, HX OF 06/27/2007  . THYROID NODULE 03/01/2007  . Hypothyroidism 03/01/2007  . Seasonal affective disorder ([place]) 03/01/2007    Current Outpatient Medications  Medication Sig Dispense Refill  . aspirin EC 81 MG tablet Take 1 tablet (81 mg total) by mouth daily. 100 tablet 3  . b complex vitamins tablet Take 1 tablet by mouth daily.    . Beet Root POWD Take by mouth 2 (two) times daily. Mixes 1 teasppon in 6 oz of water    . cefdinir (OMNICEF) 300 MG capsule Take 1 capsule (300 mg total) by mouth 2 (two) times daily. 42 capsule 0  . Cholecalciferol (VITAMIN D) 2000 units tablet Take 2,000 Units by mouth daily.    . fish oil-omega-3 fatty acids 1000 MG capsule Take 4,000 mg by mouth 2 (two) times daily.     Marland Kitchen levothyroxine (SYNTHROID, LEVOTHROID) 50 MCG tablet TAKE 1 TABLET BY MOUTH ONCE DAILY 90 tablet 3  . Multiple Vitamin (MULTIVITAMIN WITH MINERALS) TABS tablet Take 1 tablet by mouth daily.    . mupirocin ointment (BACTROBAN) 2 %  Use qd-bid 15 g 0  . tadalafil (CIALIS) 5 MG tablet Take 1-2 tablets (5-10 mg total) by mouth daily as needed for erectile dysfunction. 90 tablet 11  . TURMERIC PO Take 1 tablet by mouth 2 (two) times daily. Less than a teaspoonful -takes with tomato paste and pepper     No current facility-administered medications for this visit.     Allergies: Penicillins  Past Medical History:  Diagnosis Date  . Allergy    allergic to bee stings  . Anxiety   . Depression   . History of kidney stones   . Hx of colonic polyps    Dr Henrene Pastor  . Hypothyroidism 2008  . Insomnia   . Memory difficulty   . Multiple thyroid nodules     Past Surgical History:  Procedure Laterality Date  . Arm fracture     as a child  . COLONOSCOPY    . OPEN REDUCTION INTERNAL FIXATION (ORIF) METACARPAL Right 10/23/2016   Procedure: Right thumb closed reduction and pinning;  Surgeon: Iran Planas, MD;  Location: [facility];  Service: Orthopedics;  Laterality: Right;  . THYROIDECTOMY, PARTIAL    . TIBIA FRACTURE SURGERY      Family History  Problem Relation Age of Onset  . Coronary artery disease Father        73's  . Heart disease Father 74       MI  . Thyroid disease Neg Hx  Social History   Tobacco Use  . Smoking status: Never Smoker  . Smokeless tobacco: Never Used  Substance Use Topics  . Alcohol use: Yes    Comment: 1 glass a wine a day     Subjective:    I connected with Ward Chatters on 09/22/18 at  9:20 AM EDT by a video enabled telemedicine application and verified that I am speaking with the correct person using two identifiers.   I discussed the limitations of evaluation and management by telemedicine and the availability of in person appointments. The patient expressed understanding and agreed to proceed.  Patient notes that on Monday of this week he found a tick on his shirt but does not feel the tick actually bit him; became concerned when he began running a fever last night of 101/  experiencing body aches; denies any rash on lower extremities/ no areas of concern on body that would be consistent with insect bite; denies any chest pain or shortness of breath; no known exposure to COVID but has continue with indoor dance classes- is trying to wear masks as instructed however;  Is on Omnicef x 3 weeks for sinus infection- started this last week; also started a new dietary supplement this week prior to onset of symptoms.    Objective:  There were no vitals filed for this visit.  General: Well developed, well nourished, in no acute distress  Skin : Warm and dry.  Head: Normocephalic and atraumatic  Lungs: Respirations unlabored;  Neurologic: Alert and oriented; speech intact; face symmetrical; moves all extremities well; CNII-XII intact without focal deficit   Assessment:  1. Fever, unspecified fever cause   2. Exposure to Covid-19 Virus   3. Myalgia     Plan:  Low suspicion for tick bite as tick was on outer shirt/ not attached to body; patient is already on cephalosporin Carole Civil) so do not feel need to change antibiotic at this time; patient looks very well on video call- ? If temperature was accurate that he got last night; will go ahead and update COVID testing today; follow-up to be determined- may need to eventually d/c Omnicef and change to Doxycycline; he is also asked to stop the new supplement he started this week in case it is contributing to his muscle aches.   No follow-ups on file.  Orders Placed This Encounter  Procedures  . Novel Coronavirus, NAA ([facility])  [place] testing site only    Standing Status:   Future    Standing Expiration Date:   09/22/2019    Order Specific Question:   Known Exposure    Answer:   Potential    Requested Prescriptions    No prescriptions requested or ordered in this encounter

## 2018-09-22 NOTE — Telephone Encounter (Signed)
Pt. Reports he started feeling bad yesterday with fever and body aches. Temp. 100-101. Has a mild headache which he has for "awhile." On an antibiotic for sinus infection. Also states he had a tick bite Monday. Warm transfer to [facility] for virtual visit.  Answer Assessment - Initial Assessment Questions 1. COVID-19 DIAGNOSIS: "Who made your Coronavirus (COVID-19) diagnosis?" "Was it confirmed by a positive lab test?" If not diagnosed by a HCP, ask "Are there lots of cases (community spread) where you live?" (See public health department website, if unsure)     No test 2. ONSET: "When did the COVID-19 symptoms start?"      Yesterday 3. WORST SYMPTOM: "What is your worst symptom?" (e.g., cough, fever, shortness of breath, muscle aches)     Fever, body aches 4. COUGH: "Do you have a cough?" If so, ask: "How bad is the cough?"       No 5. FEVER: "Do you have a fever?" If so, ask: "What is your temperature, how was it measured, and when did it start?"     This morning 100-101 6. RESPIRATORY STATUS: "Describe your breathing?" (e.g., shortness of breath, wheezing, unable to speak)      No 7. BETTER-SAME-WORSE: "Are you getting better, staying the same or getting worse compared to yesterday?"  If getting worse, ask, "In what way?"     Better 8. HIGH RISK DISEASE: "Do you have any chronic medical problems?" (e.g., asthma, heart or lung disease, weak immune system, etc.)     No 9. PREGNANCY: "Is there any chance you are pregnant?" "When was your last menstrual period?"     N/A 10. OTHER SYMPTOMS: "Do you have any other symptoms?"  (e.g., chills, fatigue, headache, loss of smell or taste, muscle pain, sore throat)       Mild headache  Protocols used: CORONAVIRUS (COVID-19) DIAGNOSED OR SUSPECTED-A-AH

## 2018-09-24 ENCOUNTER — Encounter: Payer: Self-pay | Admitting: Family

## 2018-09-24 LAB — NOVEL CORONAVIRUS, NAA: SARS-CoV-2, NAA: NOT DETECTED

## 2018-09-25 ENCOUNTER — Encounter: Payer: Self-pay | Admitting: Family

## 2018-09-26 ENCOUNTER — Encounter: Payer: Self-pay | Admitting: Family

## 2018-10-04 ENCOUNTER — Encounter (HOSPITAL_COMMUNITY): Payer: Self-pay | Admitting: Emergency Medicine

## 2018-10-04 ENCOUNTER — Other Ambulatory Visit: Payer: Self-pay

## 2018-10-04 ENCOUNTER — Ambulatory Visit (INDEPENDENT_AMBULATORY_CARE_PROVIDER_SITE_OTHER): Payer: Medicare Other

## 2018-10-04 ENCOUNTER — Ambulatory Visit (HOSPITAL_COMMUNITY)
Admission: EM | Admit: 2018-10-04 | Discharge: 2018-10-04 | Disposition: A | Discharge status: Home / Self Care | Payer: Medicare Other | Attending: Emergency Medicine | Admitting: Emergency Medicine

## 2018-10-04 DIAGNOSIS — Z79890 Hormone replacement therapy (postmenopausal): Secondary | ICD-10-CM | POA: Insufficient documentation

## 2018-10-04 DIAGNOSIS — G4700 Insomnia, unspecified: Secondary | ICD-10-CM | POA: Diagnosis not present

## 2018-10-04 DIAGNOSIS — Z8719 Personal history of other diseases of the digestive system: Secondary | ICD-10-CM | POA: Diagnosis not present

## 2018-10-04 DIAGNOSIS — Z880 Allergy status to penicillin: Secondary | ICD-10-CM | POA: Insufficient documentation

## 2018-10-04 DIAGNOSIS — E039 Hypothyroidism, unspecified: Secondary | ICD-10-CM | POA: Diagnosis not present

## 2018-10-04 DIAGNOSIS — R0602 Shortness of breath: Secondary | ICD-10-CM

## 2018-10-04 DIAGNOSIS — Z87442 Personal history of urinary calculi: Secondary | ICD-10-CM | POA: Diagnosis not present

## 2018-10-04 DIAGNOSIS — R5383 Other fatigue: Secondary | ICD-10-CM | POA: Insufficient documentation

## 2018-10-04 DIAGNOSIS — Z79899 Other long term (current) drug therapy: Secondary | ICD-10-CM | POA: Diagnosis not present

## 2018-10-04 DIAGNOSIS — M791 Myalgia, unspecified site: Secondary | ICD-10-CM | POA: Diagnosis not present

## 2018-10-04 DIAGNOSIS — R05 Cough: Secondary | ICD-10-CM | POA: Insufficient documentation

## 2018-10-04 DIAGNOSIS — Z7982 Long term (current) use of aspirin: Secondary | ICD-10-CM | POA: Diagnosis not present

## 2018-10-04 DIAGNOSIS — Z20828 Contact with and (suspected) exposure to other viral communicable diseases: Secondary | ICD-10-CM | POA: Diagnosis not present

## 2018-10-04 DIAGNOSIS — R509 Fever, unspecified: Secondary | ICD-10-CM | POA: Diagnosis not present

## 2018-10-04 DIAGNOSIS — R51 Headache: Secondary | ICD-10-CM

## 2018-10-04 DIAGNOSIS — Z20822 Contact with and (suspected) exposure to covid-19: Secondary | ICD-10-CM

## 2018-10-04 MED ORDER — HYDROCOD POLST-CPM POLST ER 10-8 MG/5ML PO SUER: 5.0000 mL | Freq: Two times a day (BID) | ORAL | 0 refills | Status: DC | PRN

## 2018-10-04 NOTE — ED Triage Notes (Addendum)
Complains of breathing issues since Friday Complains of cough.   pcp suggested patient get a chest xray.   99.3 on Monday night

## 2018-10-04 NOTE — Discharge Instructions (Signed)
Your chest x-ray was normal.  There is no evidence of pneumonia.  Your repeat COVID testing will take anywhere from 2 to 5 days.  1 g of Tylenol 3 or 4 times a day as needed for pain, fever, 2.5 to 5 mL of Tussionex as needed for cough.  Rest, push fluids.  Self isolate until COVID tests are resulted.

## 2018-10-04 NOTE — Telephone Encounter (Signed)
Patient called requesting an order to be sent to an urgent care or ER for x ray.  I did explain to the patient that these facilities have the PPE to properly care for our patients.   Patient was a little upset with having to go to either facility.  Patient stated he would have to wait all day at the Urgent Care and that people are dying in the ED's.   I informed patient to call [facility] to make an appointment to be seen.   I did stress the importance of going that Dr. Alain Marion has already given to patient.

## 2018-10-04 NOTE — ED Provider Notes (Signed)
HPI  SUBJECTIVE:  Travis Morse is a 68 y.o. male who reports feeling ill since 7/16.  He reports fatigue, body aches, fevers T-max 101.8, loss of appetite, headache. reports shortness of breath and dyspnea on exertion for the past 6 days, nonproductive intermittent cough for the past 3 days.  no sore throat, loss of taste or sense of smell, no wheezing, no diarrhea.  He reports diffuse rib cage soreness with coughing or sneezing.  He has been taking Tylenol and ibuprofen with improvement in his symptoms.  No aggravating factors.  He is currently on Omnicef for sinus infection.  He denies sinus pain or pressure, purulent nasal congestion.  He states that this is getting significantly better.  No antipyretic in the past 4 to 6 hours.  He has a past medical history negative for asthma, emphysema, COPD, pneumonia, chronic kidney disease, diabetes, hypertension, known coronary artery disease.  RJJ:OACZYSAYT, Evie Lacks, MD  Patient was seen by PMD on 7/17 for fever.  Had a negative COVID test that day.  Past Medical History:  Diagnosis Date  . Allergy    allergic to bee stings  . Anxiety   . Depression   . History of kidney stones   . Hx of colonic polyps    Dr Henrene Pastor  . Hypothyroidism 2008  . Insomnia   . Memory difficulty   . Multiple thyroid nodules     Past Surgical History:  Procedure Laterality Date  . Arm fracture     as a child  . COLONOSCOPY    . OPEN REDUCTION INTERNAL FIXATION (ORIF) METACARPAL Right 10/23/2016   Procedure: Right thumb closed reduction and pinning;  Surgeon: Iran Planas, MD;  Location: [facility];  Service: Orthopedics;  Laterality: Right;  . THYROIDECTOMY, PARTIAL    . TIBIA FRACTURE SURGERY      Family History  Problem Relation Age of Onset  . Coronary artery disease Father        3's  . Heart disease Father 2       MI  . Thyroid disease Neg Hx     Social History   Tobacco Use  . Smoking status: Never Smoker  . Smokeless tobacco: Never Used   Substance Use Topics  . Alcohol use: Yes    Comment: 1 glass a wine a day   . Drug use: No    No current facility-administered medications for this encounter.   Current Outpatient Medications:  .  aspirin EC 81 MG tablet, Take 1 tablet (81 mg total) by mouth daily., Disp: 100 tablet, Rfl: 3 .  b complex vitamins tablet, Take 1 tablet by mouth daily., Disp: , Rfl:  .  Beet Root POWD, Take by mouth 2 (two) times daily. Mixes 1 teasppon in 6 oz of water, Disp: , Rfl:  .  cefdinir (OMNICEF) 300 MG capsule, Take 1 capsule (300 mg total) by mouth 2 (two) times daily., Disp: 42 capsule, Rfl: 0 .  chlorpheniramine-HYDROcodone (TUSSIONEX PENNKINETIC ER) 10-8 MG/5ML SUER, Take 5 mLs by mouth every 12 (twelve) hours as needed for cough., Disp: 60 mL, Rfl: 0 .  Cholecalciferol (VITAMIN D) 2000 units tablet, Take 2,000 Units by mouth daily., Disp: , Rfl:  .  fish oil-omega-3 fatty acids 1000 MG capsule, Take 4,000 mg by mouth 2 (two) times daily. , Disp: , Rfl:  .  levothyroxine (SYNTHROID, LEVOTHROID) 50 MCG tablet, TAKE 1 TABLET BY MOUTH ONCE DAILY, Disp: 90 tablet, Rfl: 3 .  Multiple Vitamin (MULTIVITAMIN WITH  MINERALS) TABS tablet, Take 1 tablet by mouth daily., Disp: , Rfl:  .  mupirocin ointment (BACTROBAN) 2 %, Use qd-bid, Disp: 15 g, Rfl: 0 .  tadalafil (CIALIS) 5 MG tablet, Take 1-2 tablets (5-10 mg total) by mouth daily as needed for erectile dysfunction., Disp: 90 tablet, Rfl: 11 .  TURMERIC PO, Take 1 tablet by mouth 2 (two) times daily. Less than a teaspoonful -takes with tomato paste and pepper, Disp: , Rfl:   Allergies  Allergen Reactions  . Penicillins Rash    The patient was very young and he does remember the type of reaction.  Has patient had a PCN reaction causing immediate rash, facial/tongue/throat swelling, SOB or lightheadedness with hypotension: Yes Has patient had a PCN reaction causing severe rash involving mucus membranes or skin necrosis: No Has patient had a PCN  reaction that required hospitalization: No Has patient had a PCN reaction occurring within the last 10 years: No If all of the above answers are "NO", then may proceed with Cephalo     ROS  As noted in HPI.   Physical Exam  BP 140/77 (BP Location: Left Arm)   Pulse 73   Temp 98.9 F (37.2 C) (Oral)   Resp 18   SpO2 98%   Constitutional: Well developed, well nourished, no acute distress Eyes:  EOMI, conjunctiva normal bilaterally HENT: Normocephalic, atraumatic,mucus membranes moist Respiratory: Normal inspiratory effort.  Lungs clear bilaterally, good air movement. Cardiovascular: Normal rate, regular rhythm, no murmurs rubs gallops GI: nondistended, active bowel sounds Back: No CVA tenderness skin: No rash, skin intact Musculoskeletal: no deformities Neurologic: Alert & oriented x 3, no focal neuro deficits Psychiatric: Speech and behavior appropriate   ED Course   Medications - No data to display  Orders Placed This Encounter  Procedures  . Novel Coronavirus, NAA (hospital order; send-out to ref lab)    Standing Status:   Standing    Number of Occurrences:   1  . DG Chest 2 View    Standing Status:   Standing    Number of Occurrences:   1    Order Specific Question:   Reason for Exam (SYMPTOM  OR DIAGNOSIS REQUIRED)    Answer:   r/o PNA    No results found for this or any previous visit (from the past 24 hour(s)). Dg Chest 2 View  Result Date: 10/04/2018 CLINICAL DATA:  Shortness of breath and cough. EXAM: CHEST - 2 VIEW COMPARISON:  None. FINDINGS: The heart size and mediastinal contours are within normal limits. Both lungs are clear. The visualized skeletal structures are unremarkable. IMPRESSION: No active cardiopulmonary disease. Electronically Signed   By: Lorriane Shire M.D.   On: 10/04/2018 15:00    ED Clinical Impression  1. Suspected Covid-19 Virus Infection      ED Assessment/Plan  Outside records reviewed.  As noted in HPI.  Patient was  advised to come get a chest x-ray today.  Checking chest x-ray.  Repeating COVID testing as his initial test was performed less than 24 hours after he started feeling ill.  Suspect that he could have had a false negative.  Will send home with supportive treatment including Tylenol, cough syrup as he reports chest soreness secondary to a cough, fluids, rest, quarantine self until test is resulted.  Follow-up with PMD as needed, to the ER if he gets worse.  Reviewed imaging independently.  Normal chest x-ray.  See radiology report for full details.  Plan as above.  Discussed  imaging, MDM, treatment plan, and plan for follow-up with patient. Discussed sn/sx that should prompt return to the ED. patient agrees with plan.   Meds ordered this encounter  Medications  . chlorpheniramine-HYDROcodone (TUSSIONEX PENNKINETIC ER) 10-8 MG/5ML SUER    Sig: Take 5 mLs by mouth every 12 (twelve) hours as needed for cough.    Dispense:  60 mL    Refill:  0    *This clinic note was created using [employer]. Therefore, there may be occasional mistakes despite careful proofreading.   ?   Melynda Ripple, MD 10/04/18 1701

## 2018-10-08 LAB — NOVEL CORONAVIRUS, NAA (HOSP ORDER, SEND-OUT TO REF LAB; TAT 18-24 HRS): SARS-CoV-2, NAA: NOT DETECTED

## 2018-10-11 ENCOUNTER — Telehealth: Payer: Self-pay | Admitting: *Deleted

## 2018-10-11 DIAGNOSIS — R509 Fever, unspecified: Secondary | ICD-10-CM

## 2018-10-11 NOTE — Telephone Encounter (Signed)
Appt scheduled.  Pt would like antibody test for COVID ordered.

## 2018-10-11 NOTE — Telephone Encounter (Signed)
Ok Thx 

## 2018-10-11 NOTE — Addendum Note (Signed)
Addended by: Cresenciano Lick on: 10/11/2018 05:11 PM   Modules accepted: Orders

## 2018-10-11 NOTE — Telephone Encounter (Signed)
Pt informed of below.  

## 2018-10-11 NOTE — Addendum Note (Signed)
Addended by: Cassandria Anger on: 10/11/2018 02:26 PM   Modules accepted: Orders

## 2018-10-11 NOTE — Telephone Encounter (Signed)
Left detailed message informing pt to call us back and schedule a 2 week f/u with PCP per MD during the week of 10/23/18.

## 2018-10-12 ENCOUNTER — Other Ambulatory Visit: Payer: Medicare Other

## 2018-10-12 DIAGNOSIS — R509 Fever, unspecified: Secondary | ICD-10-CM

## 2018-10-12 NOTE — Addendum Note (Signed)
Addended by: Cresenciano Lick on: 10/12/2018 08:23 AM   Modules accepted: Orders

## 2018-10-13 LAB — SAR COV2 SEROLOGY (COVID19)AB(IGG),IA: SARS CoV2 AB IGG: NEGATIVE

## 2018-10-26 ENCOUNTER — Ambulatory Visit (INDEPENDENT_AMBULATORY_CARE_PROVIDER_SITE_OTHER): Payer: Medicare Other | Admitting: Internal Medicine

## 2018-10-26 DIAGNOSIS — E034 Atrophy of thyroid (acquired): Secondary | ICD-10-CM

## 2018-10-26 DIAGNOSIS — R931 Abnormal findings on diagnostic imaging of heart and coronary circulation: Secondary | ICD-10-CM | POA: Diagnosis not present

## 2018-10-26 DIAGNOSIS — R413 Other amnesia: Secondary | ICD-10-CM | POA: Diagnosis not present

## 2018-10-26 DIAGNOSIS — G4700 Insomnia, unspecified: Secondary | ICD-10-CM

## 2018-10-26 DIAGNOSIS — J069 Acute upper respiratory infection, unspecified: Secondary | ICD-10-CM

## 2018-10-26 NOTE — Progress Notes (Signed)
Virtual Visit via Video Note  I connected with Travis Morse on 10/26/18 at  3:40 PM EDT by a video enabled telemedicine application and verified that I am speaking with the correct person using two identifiers.   I discussed the limitations of evaluation and management by telemedicine and the availability of in person appointments. The patient expressed understanding and agreed to proceed.  History of Present Illness: We need to follow-up on his recent febrile illness, elevated coronary calcium, insomnia  There has been no runny nose, cough, chest pain, shortness of breath, abdominal pain, diarrhea, constipation, arthralgias, skin rashes.  She has recovered well.  She has been having difficulty sleeping which she thinks caused more problems with memory recently   Observations/Objective: The patient appears to be in no acute distress, looks well.  Assessment and Plan:  See my Assessment and Plan. Follow Up Instructions:    I discussed the assessment and treatment plan with the patient. The patient was provided an opportunity to ask questions and all were answered. The patient agreed with the plan and demonstrated an understanding of the instructions.   The patient was advised to call back or seek an in-person evaluation if the symptoms worsen or if the condition fails to improve as anticipated.  I provided face-to-face time during this encounter. We were at different locations.   Walker Kehr, MD

## 2018-10-28 ENCOUNTER — Encounter: Payer: Self-pay | Admitting: Internal Medicine

## 2018-10-28 NOTE — Assessment & Plan Note (Signed)
The patient is planning to see a cardiologist at [facility].  He will get back with me with the name.  He is on aspirin

## 2018-10-28 NOTE — Assessment & Plan Note (Signed)
Treat insomnia 

## 2018-10-28 NOTE — Assessment & Plan Note (Signed)
Continue with Levothroid

## 2018-10-28 NOTE — Assessment & Plan Note (Signed)
Symptoms resolved 

## 2018-10-28 NOTE — Assessment & Plan Note (Signed)
We discussed over-the-counter treatment options including valerian root

## 2018-10-31 ENCOUNTER — Other Ambulatory Visit: Payer: Self-pay | Admitting: Internal Medicine

## 2018-10-31 DIAGNOSIS — R931 Abnormal findings on diagnostic imaging of heart and coronary circulation: Secondary | ICD-10-CM

## 2018-11-14 ENCOUNTER — Other Ambulatory Visit: Payer: Self-pay

## 2018-11-14 ENCOUNTER — Telehealth: Payer: Medicare Other | Admitting: Physician Assistant

## 2018-11-14 ENCOUNTER — Ambulatory Visit (INDEPENDENT_AMBULATORY_CARE_PROVIDER_SITE_OTHER): Payer: Medicare Other | Admitting: Internal Medicine

## 2018-11-14 ENCOUNTER — Encounter: Payer: Self-pay | Admitting: Internal Medicine

## 2018-11-14 DIAGNOSIS — R220 Localized swelling, mass and lump, head: Secondary | ICD-10-CM

## 2018-11-14 DIAGNOSIS — I2510 Atherosclerotic heart disease of native coronary artery without angina pectoris: Secondary | ICD-10-CM | POA: Diagnosis not present

## 2018-11-14 DIAGNOSIS — I2583 Coronary atherosclerosis due to lipid rich plaque: Secondary | ICD-10-CM

## 2018-11-14 MED ORDER — CEFUROXIME AXETIL 500 MG PO TABS: 500.0000 mg | ORAL_TABLET | Freq: Two times a day (BID) | ORAL | 1 refills | Status: DC

## 2018-11-14 NOTE — Progress Notes (Signed)
Based on what you shared with me, I feel your condition warrants further evaluation and I recommend that you be seen for a face to face office visit.  The pictures you attached could be suggestive of a more concerning problem, such as an allergic reaction, abscess, or dental infection. I would recommend you seek face-to-face evaluation today from your PCP or an urgent care so that someone can examine you more closely.    NOTE: If you entered your credit card information for this eVisit, you will not be charged. You may see a "hold" on your card for the $35 but that hold will drop off and you will not have a charge processed.  If you are having a true medical emergency please call 911.     For an urgent face to face visit, [facility] has four urgent care centers for your convenience:   . [facility]    [phone]                  Get Driving Directions  [id number] [place], [place] [address] . 10 am to 8 pm Monday-Friday . 12 pm to 8 pm Saturday-Sunday   . [facility] at [place]                  Get Driving Directions  [id number] [place], [place], [place] [address] . 8 am to 8 pm Monday-Friday . 9 am to 6 pm Saturday . 11 am to 6 pm Sunday   . [facility] at [place]                  Get Driving Directions   [address] [place], [place] [address] . 8 am to 8 pm Monday-Friday . 8 am to 4 pm Saturday-Sunday    . [facility] at [place]                    Get Driving Directions  [id number]  [address] [place],  [address]  . Monday-Friday, 12 PM to 6 PM    Your e-visit answers were reviewed by a board certified advanced clinical practitioner to complete your personal care plan.  Thank you for using e-Visits.  Greater than 5 minutes, yet less than 10 minutes of time have been spent researching, coordinating, and  implementing care for this patient today.

## 2018-11-14 NOTE — Progress Notes (Signed)
Subjective:  Patient ID: Travis Morse, male    DOB: Oct 25, 1950  Age: 68 y.o. MRN: [id number]  CC: No chief complaint on file.   HPI NURIEL DIMUZIO presents for R cheek swelling since Sunday - no pain, No dental issue, no sinusitis sx's. CT w/ethmoid sinusitis on 7/8. He took abx x 3 weeks.  F/u CAD Outpatient Medications Prior to Visit  Medication Sig Dispense Refill  . aspirin EC 81 MG tablet Take 1 tablet (81 mg total) by mouth daily. 100 tablet 3  . b complex vitamins tablet Take 1 tablet by mouth daily.    . Beet Root POWD Take by mouth 2 (two) times daily. Mixes 1 teasppon in 6 oz of water    . cefdinir (OMNICEF) 300 MG capsule Take 1 capsule (300 mg total) by mouth 2 (two) times daily. 42 capsule 0  . chlorpheniramine-HYDROcodone (TUSSIONEX PENNKINETIC ER) 10-8 MG/5ML SUER Take 5 mLs by mouth every 12 (twelve) hours as needed for cough. 60 mL 0  . Cholecalciferol (VITAMIN D) 2000 units tablet Take 2,000 Units by mouth daily.    . fish oil-omega-3 fatty acids 1000 MG capsule Take 4,000 mg by mouth 2 (two) times daily.     Marland Kitchen levothyroxine (SYNTHROID, LEVOTHROID) 50 MCG tablet TAKE 1 TABLET BY MOUTH ONCE DAILY 90 tablet 3  . Multiple Vitamin (MULTIVITAMIN WITH MINERALS) TABS tablet Take 1 tablet by mouth daily.    . mupirocin ointment (BACTROBAN) 2 % Use qd-bid 15 g 0  . tadalafil (CIALIS) 5 MG tablet Take 1-2 tablets (5-10 mg total) by mouth daily as needed for erectile dysfunction. 90 tablet 11  . TURMERIC PO Take 1 tablet by mouth 2 (two) times daily. Less than a teaspoonful -takes with tomato paste and pepper     No facility-administered medications prior to visit.     ROS: Review of Systems  Constitutional: Negative for appetite change, fatigue and unexpected weight change.  HENT: Negative for congestion, nosebleeds, sneezing, sore throat and trouble swallowing.   Eyes: Negative for itching and visual disturbance.  Respiratory: Negative for cough.   Cardiovascular:  Negative for chest pain, palpitations and leg swelling.  Gastrointestinal: Negative for abdominal distention, blood in stool, diarrhea and nausea.  Genitourinary: Negative for frequency and hematuria.  Musculoskeletal: Negative for back pain, gait problem, joint swelling and neck pain.  Skin: Negative for rash.  Neurological: Negative for dizziness, tremors, speech difficulty and weakness.  Psychiatric/Behavioral: Negative for agitation, dysphoric mood and sleep disturbance. The patient is not nervous/anxious.     Objective:  BP 124/82 (BP Location: Left Arm, Patient Position: Sitting, Cuff Size: Normal)   Pulse (!) 6   Temp 98.6 F (37 C) (Oral)   Ht 5\' 10"  (1.778 m)   Wt 160 lb (72.6 kg)   SpO2 96%   BMI 22.96 kg/m   BP Readings from Last 3 Encounters:  11/14/18 124/82  10/04/18 140/77  05/17/18 120/84    Wt Readings from Last 3 Encounters:  11/14/18 160 lb (72.6 kg)  05/17/18 153 lb (69.4 kg)  10/17/17 157 lb (71.2 kg)    Physical Exam Constitutional:      General: He is not in acute distress.    Appearance: He is well-developed.     Comments: NAD  Eyes:     Conjunctiva/sclera: Conjunctivae normal.     Pupils: Pupils are equal, round, and reactive to light.  Neck:     Musculoskeletal: Normal range of motion.  Thyroid: No thyromegaly.     Vascular: No JVD.  Cardiovascular:     Rate and Rhythm: Normal rate and regular rhythm.     Heart sounds: Normal heart sounds. No murmur. No friction rub. No gallop.   Pulmonary:     Effort: Pulmonary effort is normal. No respiratory distress.     Breath sounds: Normal breath sounds. No wheezing or rales.  Chest:     Chest wall: No tenderness.  Abdominal:     General: Bowel sounds are normal. There is no distension.     Palpations: Abdomen is soft. There is no mass.     Tenderness: There is no abdominal tenderness. There is no guarding or rebound.  Musculoskeletal: Normal range of motion.        General: Swelling  present. No tenderness.  Lymphadenopathy:     Cervical: No cervical adenopathy.  Skin:    General: Skin is warm and dry.     Findings: No rash.  Neurological:     Mental Status: He is alert and oriented to person, place, and time.     Cranial Nerves: No cranial nerve deficit.     Motor: No abnormal muscle tone.     Coordination: Coordination normal.     Gait: Gait normal.     Deep Tendon Reflexes: Reflexes are normal and symmetric.  Psychiatric:        Behavior: Behavior normal.        Thought Content: Thought content normal.        Judgment: Judgment normal.    R cheeck - swollen w/palpable firmness, droopy R mouth corner   Lab Results  Component Value Date   WBC 5.8 10/17/2017   HGB 14.3 10/17/2017   HCT 41.2 10/17/2017   PLT 246.0 10/17/2017   GLUCOSE 110 (H) 10/17/2017   CHOL 151 10/17/2017   TRIG 100.0 10/17/2017   HDL 53.90 10/17/2017   LDLDIRECT 117.3 01/21/2011   LDLCALC 77 10/17/2017   ALT 15 09/02/2017   AST 24 09/02/2017   NA 139 10/17/2017   K 4.0 10/17/2017   CL 102 10/17/2017   CREATININE 0.75 10/17/2017   BUN 12 10/17/2017   CO2 30 10/17/2017   TSH 2.30 09/02/2017   PSA 1.30 10/17/2017   HGBA1C 5.6 10/17/2017    Dg Chest 2 View  Result Date: 10/04/2018 CLINICAL DATA:  Shortness of breath and cough. EXAM: CHEST - 2 VIEW COMPARISON:  None. FINDINGS: The heart size and mediastinal contours are within normal limits. Both lungs are clear. The visualized skeletal structures are unremarkable. IMPRESSION: No active cardiopulmonary disease. Electronically Signed   By: Lorriane Shire M.D.   On: 10/04/2018 15:00    Assessment & Plan:   There are no diagnoses linked to this encounter.   No orders of the defined types were placed in this encounter.    Follow-up: No follow-ups on file.  Walker Kehr, MD

## 2018-11-14 NOTE — Assessment & Plan Note (Signed)
Card appt this Friday [facility]) ASA

## 2018-11-14 NOTE — Assessment & Plan Note (Addendum)
R ?dental infection vs other Ceftin Make a dental appt

## 2018-11-19 ENCOUNTER — Other Ambulatory Visit: Payer: Self-pay | Admitting: Internal Medicine

## 2018-11-19 DIAGNOSIS — E034 Atrophy of thyroid (acquired): Secondary | ICD-10-CM

## 2018-11-19 DIAGNOSIS — Z0000 Encounter for general adult medical examination without abnormal findings: Secondary | ICD-10-CM

## 2018-11-20 ENCOUNTER — Other Ambulatory Visit (INDEPENDENT_AMBULATORY_CARE_PROVIDER_SITE_OTHER): Payer: Medicare Other

## 2018-11-20 DIAGNOSIS — Z0000 Encounter for general adult medical examination without abnormal findings: Secondary | ICD-10-CM

## 2018-11-20 DIAGNOSIS — E034 Atrophy of thyroid (acquired): Secondary | ICD-10-CM | POA: Diagnosis not present

## 2018-11-20 LAB — CBC WITH DIFFERENTIAL/PLATELET
Basophils Absolute: 0.1 10*3/uL (ref 0.0–0.1)
Basophils Relative: 1.2 % (ref 0.0–3.0)
Eosinophils Absolute: 0.2 10*3/uL (ref 0.0–0.7)
Eosinophils Relative: 4.5 % (ref 0.0–5.0)
HCT: 40.6 % (ref 39.0–52.0)
Hemoglobin: 13.9 g/dL (ref 13.0–17.0)
Lymphocytes Relative: 36.6 % (ref 12.0–46.0)
Lymphs Abs: 1.6 10*3/uL (ref 0.7–4.0)
MCHC: 34.3 g/dL (ref 30.0–36.0)
MCV: 95.4 fl (ref 78.0–100.0)
Monocytes Absolute: 0.3 10*3/uL (ref 0.1–1.0)
Monocytes Relative: 7.9 % (ref 3.0–12.0)
Neutro Abs: 2.1 10*3/uL (ref 1.4–7.7)
Neutrophils Relative %: 49.8 % (ref 43.0–77.0)
Platelets: 282 10*3/uL (ref 150.0–400.0)
RBC: 4.26 Mil/uL (ref 4.22–5.81)
RDW: 13.1 % (ref 11.5–15.5)
WBC: 4.3 10*3/uL (ref 4.0–10.5)

## 2018-11-20 LAB — LIPID PANEL
Cholesterol: 159 mg/dL (ref 0–200)
HDL: 65.4 mg/dL (ref >39.00)
LDL Cholesterol: 87 mg/dL (ref 0–99)
NonHDL: 93.69
Total CHOL/HDL Ratio: 2
Triglycerides: 35 mg/dL (ref 0.0–149.0)
VLDL: 7 mg/dL (ref 0.0–40.0)

## 2018-11-20 LAB — HEPATIC FUNCTION PANEL
ALT: 18 U/L (ref 0–53)
AST: 27 U/L (ref 0–37)
Albumin: 4.1 g/dL (ref 3.5–5.2)
Alkaline Phosphatase: 62 U/L (ref 39–117)
Bilirubin, Direct: 0.1 mg/dL (ref 0.0–0.3)
Total Bilirubin: 0.5 mg/dL (ref 0.2–1.2)
Total Protein: 7.2 g/dL (ref 6.0–8.3)

## 2018-11-20 LAB — BASIC METABOLIC PANEL
BUN: 12 mg/dL (ref 6–23)
CO2: 31 mEq/L (ref 19–32)
Calcium: 9.5 mg/dL (ref 8.4–10.5)
Chloride: 101 mEq/L (ref 96–112)
Creatinine, Ser: 0.73 mg/dL (ref 0.40–1.50)
GFR: 106.57 mL/min (ref >60.00)
Glucose, Bld: 99 mg/dL (ref 70–99)
Potassium: 3.9 mEq/L (ref 3.5–5.1)
Sodium: 140 mEq/L (ref 135–145)

## 2018-11-20 LAB — URINALYSIS
Bilirubin Urine: NEGATIVE
Hgb urine dipstick: NEGATIVE
Ketones, ur: NEGATIVE
Leukocytes,Ua: NEGATIVE
Nitrite: NEGATIVE
Specific Gravity, Urine: 1.01 (ref 1.000–1.030)
Total Protein, Urine: NEGATIVE
Urine Glucose: NEGATIVE
Urobilinogen, UA: 0.2 (ref 0.0–1.0)
pH: 7 (ref 5.0–8.0)

## 2018-11-20 LAB — T4, FREE: Free T4: 0.79 ng/dL (ref 0.60–1.60)

## 2018-11-20 LAB — TSH: TSH: 2.68 u[IU]/mL (ref 0.35–4.50)

## 2018-11-20 LAB — PSA: PSA: 1.34 ng/mL (ref 0.10–4.00)

## 2018-11-21 ENCOUNTER — Ambulatory Visit (INDEPENDENT_AMBULATORY_CARE_PROVIDER_SITE_OTHER): Payer: Medicare Other | Admitting: Internal Medicine

## 2018-11-21 ENCOUNTER — Other Ambulatory Visit: Payer: Self-pay | Admitting: *Deleted

## 2018-11-21 ENCOUNTER — Encounter: Payer: Self-pay | Admitting: Internal Medicine

## 2018-11-21 ENCOUNTER — Other Ambulatory Visit: Payer: Self-pay

## 2018-11-21 DIAGNOSIS — I2583 Coronary atherosclerosis due to lipid rich plaque: Secondary | ICD-10-CM | POA: Diagnosis not present

## 2018-11-21 DIAGNOSIS — Z0000 Encounter for general adult medical examination without abnormal findings: Secondary | ICD-10-CM | POA: Diagnosis not present

## 2018-11-21 DIAGNOSIS — I2510 Atherosclerotic heart disease of native coronary artery without angina pectoris: Secondary | ICD-10-CM | POA: Diagnosis not present

## 2018-11-21 NOTE — Assessment & Plan Note (Signed)
Statins discussed - Crestor

## 2018-11-21 NOTE — Assessment & Plan Note (Signed)
We discussed age appropriate health related issues, including available/recomended screening tests and vaccinations. We discussed a need for adhering to healthy diet and exercise. Labs were ordered to be later reviewed . All questions were answered. Colon was due in 2016 A cardiac CT scan for coronary calcium score 660 - 2020  [place] 2018 (-)  

## 2018-11-21 NOTE — Progress Notes (Signed)
Subjective:  Patient ID: Travis Morse, male    DOB: 11/07/1950  Age: 68 y.o. MRN: [id number]  CC: No chief complaint on file.   HPI TYMEER TURKOVICH presents for a well exam C/o stress Not depressed  Outpatient Medications Prior to Visit  Medication Sig Dispense Refill  . aspirin EC 81 MG tablet Take 1 tablet (81 mg total) by mouth daily. 100 tablet 3  . b complex vitamins tablet Take 1 tablet by mouth daily.    . Beet Root POWD Take by mouth 2 (two) times daily. Mixes 1 teasppon in 6 oz of water    . cefUROXime (CEFTIN) 500 MG tablet Take 1 tablet (500 mg total) by mouth 2 (two) times daily. 14 tablet 1  . chlorpheniramine-HYDROcodone (TUSSIONEX PENNKINETIC ER) 10-8 MG/5ML SUER Take 5 mLs by mouth every 12 (twelve) hours as needed for cough. 60 mL 0  . Cholecalciferol (VITAMIN D) 2000 units tablet Take 2,000 Units by mouth daily.    . fish oil-omega-3 fatty acids 1000 MG capsule Take 4,000 mg by mouth 2 (two) times daily.     Marland Kitchen levothyroxine (SYNTHROID, LEVOTHROID) 50 MCG tablet TAKE 1 TABLET BY MOUTH ONCE DAILY 90 tablet 3  . Multiple Vitamin (MULTIVITAMIN WITH MINERALS) TABS tablet Take 1 tablet by mouth daily.    . mupirocin ointment (BACTROBAN) 2 % Use qd-bid 15 g 0  . rosuvastatin (CRESTOR) 10 MG tablet Take 10 mg by mouth daily.    . tadalafil (CIALIS) 5 MG tablet Take 1-2 tablets (5-10 mg total) by mouth daily as needed for erectile dysfunction. 90 tablet 11  . TURMERIC PO Take 1 tablet by mouth 2 (two) times daily. Less than a teaspoonful -takes with tomato paste and pepper     No facility-administered medications prior to visit.     ROS: Review of Systems  Constitutional: Negative for appetite change, fatigue and unexpected weight change.  HENT: Negative for congestion, nosebleeds, sneezing, sore throat and trouble swallowing.   Eyes: Negative for itching and visual disturbance.  Respiratory: Negative for cough.   Cardiovascular: Negative for chest pain,  palpitations and leg swelling.  Gastrointestinal: Negative for abdominal distention, blood in stool, diarrhea and nausea.  Genitourinary: Negative for frequency and hematuria.  Musculoskeletal: Negative for back pain, gait problem, joint swelling and neck pain.  Skin: Negative for rash.  Neurological: Negative for dizziness, tremors, speech difficulty and weakness.  Psychiatric/Behavioral: Negative for agitation, dysphoric mood, sleep disturbance and suicidal ideas. The patient is not nervous/anxious.     Objective:  BP 122/78 (BP Location: Left Arm, Patient Position: Sitting, Cuff Size: Normal)   Pulse 61   Temp 98.2 F (36.8 C) (Oral)   Ht 5\' 10"  (1.778 m)   Wt 159 lb (72.1 kg)   SpO2 97%   BMI 22.81 kg/m   BP Readings from Last 3 Encounters:  11/21/18 122/78  11/14/18 124/82  10/04/18 140/77    Wt Readings from Last 3 Encounters:  11/21/18 159 lb (72.1 kg)  11/14/18 160 lb (72.6 kg)  05/17/18 153 lb (69.4 kg)    Physical Exam Constitutional:      General: He is not in acute distress.    Appearance: He is well-developed.     Comments: NAD  Eyes:     Conjunctiva/sclera: Conjunctivae normal.     Pupils: Pupils are equal, round, and reactive to light.  Neck:     Musculoskeletal: Normal range of motion.     Thyroid: No thyromegaly.  Vascular: No JVD.  Cardiovascular:     Rate and Rhythm: Normal rate and regular rhythm.     Heart sounds: Normal heart sounds. No murmur. No friction rub. No gallop.   Pulmonary:     Effort: Pulmonary effort is normal. No respiratory distress.     Breath sounds: Normal breath sounds. No wheezing or rales.  Chest:     Chest wall: No tenderness.  Abdominal:     General: Bowel sounds are normal. There is no distension.     Palpations: Abdomen is soft. There is no mass.     Tenderness: There is no abdominal tenderness. There is no guarding or rebound.  Musculoskeletal: Normal range of motion.        General: No tenderness.   Lymphadenopathy:     Cervical: No cervical adenopathy.  Skin:    General: Skin is warm and dry.     Findings: No rash.  Neurological:     Mental Status: He is alert and oriented to person, place, and time.     Cranial Nerves: No cranial nerve deficit.     Motor: No abnormal muscle tone.     Coordination: Coordination normal.     Gait: Gait normal.     Deep Tendon Reflexes: Reflexes are normal and symmetric.  Psychiatric:        Behavior: Behavior normal.        Thought Content: Thought content normal.        Judgment: Judgment normal.     Lab Results  Component Value Date   WBC 4.3 11/20/2018   HGB 13.9 11/20/2018   HCT 40.6 11/20/2018   PLT 282.0 11/20/2018   GLUCOSE 99 11/20/2018   CHOL 159 11/20/2018   TRIG 35.0 11/20/2018   HDL 65.40 11/20/2018   LDLDIRECT 117.3 01/21/2011   LDLCALC 87 11/20/2018   ALT 18 11/20/2018   AST 27 11/20/2018   NA 140 11/20/2018   K 3.9 11/20/2018   CL 101 11/20/2018   CREATININE 0.73 11/20/2018   BUN 12 11/20/2018   CO2 31 11/20/2018   TSH 2.68 11/20/2018   PSA 1.34 11/20/2018   HGBA1C 5.6 10/17/2017    Dg Chest 2 View  Result Date: 10/04/2018 CLINICAL DATA:  Shortness of breath and cough. EXAM: CHEST - 2 VIEW COMPARISON:  None. FINDINGS: The heart size and mediastinal contours are within normal limits. Both lungs are clear. The visualized skeletal structures are unremarkable. IMPRESSION: No active cardiopulmonary disease. Electronically Signed   By: Lorriane Shire M.D.   On: 10/04/2018 15:00    Assessment & Plan:   There are no diagnoses linked to this encounter.   No orders of the defined types were placed in this encounter.    Follow-up: No follow-ups on file.  Walker Kehr, MD

## 2019-01-18 ENCOUNTER — Other Ambulatory Visit (INDEPENDENT_AMBULATORY_CARE_PROVIDER_SITE_OTHER): Payer: Medicare Other

## 2019-01-18 ENCOUNTER — Other Ambulatory Visit: Payer: Self-pay

## 2019-01-18 DIAGNOSIS — E785 Hyperlipidemia, unspecified: Secondary | ICD-10-CM

## 2019-01-18 LAB — COMPREHENSIVE METABOLIC PANEL
ALT: 17 U/L (ref 0–53)
AST: 26 U/L (ref 0–37)
Albumin: 4.3 g/dL (ref 3.5–5.2)
Alkaline Phosphatase: 53 U/L (ref 39–117)
BUN: 10 mg/dL (ref 6–23)
CO2: 28 mEq/L (ref 19–32)
Calcium: 9.6 mg/dL (ref 8.4–10.5)
Chloride: 101 mEq/L (ref 96–112)
Creatinine, Ser: 0.87 mg/dL (ref 0.40–1.50)
GFR: 87 mL/min (ref >60.00)
Glucose, Bld: 109 mg/dL — ABNORMAL HIGH (ref 70–99)
Potassium: 3.7 mEq/L (ref 3.5–5.1)
Sodium: 138 mEq/L (ref 135–145)
Total Bilirubin: 0.4 mg/dL (ref 0.2–1.2)
Total Protein: 7.8 g/dL (ref 6.0–8.3)

## 2019-01-18 LAB — LIPID PANEL
Cholesterol: 132 mg/dL (ref 0–200)
HDL: 52.3 mg/dL (ref >39.00)
LDL Cholesterol: 68 mg/dL (ref 0–99)
NonHDL: 79.3
Total CHOL/HDL Ratio: 3
Triglycerides: 55 mg/dL (ref 0.0–149.0)
VLDL: 11 mg/dL (ref 0.0–40.0)

## 2019-01-25 ENCOUNTER — Encounter: Payer: Self-pay | Admitting: Internal Medicine

## 2019-01-30 ENCOUNTER — Other Ambulatory Visit: Payer: Self-pay

## 2019-01-30 ENCOUNTER — Encounter: Payer: Self-pay | Admitting: Internal Medicine

## 2019-01-30 ENCOUNTER — Ambulatory Visit (INDEPENDENT_AMBULATORY_CARE_PROVIDER_SITE_OTHER): Payer: Medicare Other | Admitting: Internal Medicine

## 2019-01-30 DIAGNOSIS — R609 Edema, unspecified: Secondary | ICD-10-CM | POA: Diagnosis not present

## 2019-01-30 DIAGNOSIS — R05 Cough: Secondary | ICD-10-CM | POA: Diagnosis not present

## 2019-01-30 DIAGNOSIS — R059 Cough, unspecified: Secondary | ICD-10-CM

## 2019-01-30 MED ORDER — TADALAFIL 5 MG PO TABS: 5.0000 mg | ORAL_TABLET | Freq: Every day | ORAL | 11 refills | Status: DC | PRN

## 2019-01-30 MED ORDER — FUROSEMIDE 20 MG PO TABS: 20.0000 mg | ORAL_TABLET | Freq: Every day | ORAL | 3 refills | Status: DC | PRN

## 2019-01-30 MED ORDER — BREO ELLIPTA 100-25 MCG/INH IN AEPB: 1.0000 | INHALATION_SPRAY | Freq: Every day | RESPIRATORY_TRACT | 3 refills | Status: DC

## 2019-01-30 NOTE — Patient Instructions (Signed)
Support knee highs Cut back on salt      Chronic Venous Insufficiency Chronic venous insufficiency is a condition where the leg veins cannot effectively pump blood from the legs to the heart. This happens when the vein walls are either stretched, weakened, or damaged, or when the valves inside the vein are damaged. With the right treatment, you should be able to continue with an active life. This condition is also called venous stasis. What are the causes? Common causes of this condition include:  High blood pressure inside the veins (venous hypertension).  Sitting or standing too long, causing increased blood pressure in the leg veins.  A blood clot that blocks blood flow in a vein (deep vein thrombosis, DVT).  Inflammation of a vein (phlebitis) that causes a blood clot to form.  Tumors in the pelvis that cause blood to back up. What increases the risk? The following factors may make you more likely to develop this condition:  Having a family history of this condition.  Obesity.  Pregnancy.  Living without enough regular physical activity or exercise (sedentary lifestyle).  Smoking.  Having a job that requires long periods of standing or sitting in one place.  Being a certain age. Women in their 24s and 8s and men in their 26s are more likely to develop this condition. What are the signs or symptoms? Symptoms of this condition include:  Veins that are enlarged, bulging, or twisted (varicose veins).  Skin breakdown or ulcers.  Reddened skin or dark discoloration of skin on the leg between the knee and ankle.  Brown, smooth, tight, and painful skin just above the ankle, usually on the inside of the leg (lipodermatosclerosis).  Swelling of the legs. How is this diagnosed? This condition may be diagnosed based on:  Your medical history.  A physical exam.  Tests, such as: ? A procedure that creates an image of a blood vessel and nearby organs and provides  information about blood flow through the blood vessel (duplex ultrasound). ? A procedure that tests blood flow (plethysmography). ? A procedure that looks at the veins using X-ray and dye (venogram). How is this treated? The goals of treatment are to help you return to an active life and to minimize pain or disability. Treatment depends on the severity of your condition, and it may include:  Wearing compression stockings. These can help relieve symptoms and help prevent your condition from getting worse. However, they do not cure the condition.  Sclerotherapy. This procedure involves an injection of a solution that shrinks damaged veins.  Surgery. This may involve: ? Removing a diseased vein (vein stripping). ? Cutting off blood flow through the vein (laser ablation surgery). ? Repairing or reconstructing a valve within the affected vein. Follow these instructions at home:      Wear compression stockings as told by your health care provider. These stockings help to prevent blood clots and reduce swelling in your legs.  Take over-the-counter and prescription medicines only as told by your health care provider.  Stay active by exercising, walking, or doing different activities. Ask your health care provider what activities are safe for you and how much exercise you need.  Drink enough fluid to keep your urine pale yellow.  Do not use any products that contain nicotine or tobacco, such as cigarettes, e-cigarettes, and chewing tobacco. If you need help quitting, ask your health care provider.  Keep all follow-up visits as told by your health care provider. This is important. Contact a  health care provider if you:  Have redness, swelling, or more pain in the affected area.  See a red streak or line that goes up or down from the affected area.  Have skin breakdown or skin loss in the affected area, even if the breakdown is small.  Get an injury in the affected area. Get help right  away if:  You get an injury and an open wound in the affected area.  You have: ? Severe pain that does not get better with medicine. ? Sudden numbness or weakness in the foot or ankle below the affected area. ? Trouble moving your foot or ankle. ? A fever. ? Worse or persistent symptoms. ? Chest pain. ? Shortness of breath. Summary  Chronic venous insufficiency is a condition where the leg veins cannot effectively pump blood from the legs to the heart.  Chronic venous insufficiency occurs when the vein walls become stretched, weakened, or damaged, or when valves within the vein are damaged.  Treatment depends on how severe your condition is. It often involves wearing compression stockings and may involve having a procedure.  Make sure you stay active by exercising, walking, or doing different activities. Ask your health care provider what activities are safe for you and how much exercise you need. This information is not intended to replace advice given to you by your health care provider. Make sure you discuss any questions you have with your health care provider. Document Released: 06/28/2006 Document Revised: 11/15/2017 Document Reviewed: 11/15/2017 Elsevier Patient Education  2020 [employer].

## 2019-01-30 NOTE — Assessment & Plan Note (Signed)
Seasonal  Claritin to re-start A cardiac CT scan for coronary calcium was done Breo trial Pulm ref if not better

## 2019-01-30 NOTE — Assessment & Plan Note (Signed)
C/o leg swelling - recurrent ?venous insufficiency Support knee highs Cut back on salt   2 weeks ago - daily occurrence Furosemide prn Had ECHO at [facility]- ok

## 2019-01-30 NOTE — Progress Notes (Signed)
Subjective:  Patient ID: Travis Morse, male    DOB: November 28, 1950  Age: 68 y.o. MRN: [id number]  CC: No chief complaint on file.   HPI Travis Morse presents for cough x several weeks following the "cold", seasonal. No SOB, CP or wheezing. ECHO at [facility] was normal C/o leg swelling  2 weeks ago - daily occurrence   Outpatient Medications Prior to Visit  Medication Sig Dispense Refill  . aspirin EC 81 MG tablet Take 1 tablet (81 mg total) by mouth daily. 100 tablet 3  . b complex vitamins tablet Take 1 tablet by mouth daily.    . Beet Root POWD Take by mouth 2 (two) times daily. Mixes 1 teasppon in 6 oz of water    . Cholecalciferol (VITAMIN D) 2000 units tablet Take 2,000 Units by mouth daily.    . fish oil-omega-3 fatty acids 1000 MG capsule Take 4,000 mg by mouth 2 (two) times daily.     Marland Kitchen levothyroxine (SYNTHROID, LEVOTHROID) 50 MCG tablet TAKE 1 TABLET BY MOUTH ONCE DAILY 90 tablet 3  . Multiple Vitamin (MULTIVITAMIN WITH MINERALS) TABS tablet Take 1 tablet by mouth daily.    . rosuvastatin (CRESTOR) 10 MG tablet Take 10 mg by mouth daily.    . tadalafil (CIALIS) 5 MG tablet Take 1-2 tablets (5-10 mg total) by mouth daily as needed for erectile dysfunction. 90 tablet 11  . TURMERIC PO Take 1 tablet by mouth 2 (two) times daily. Less than a teaspoonful -takes with tomato paste and pepper    . mupirocin ointment (BACTROBAN) 2 % Use qd-bid 15 g 0   No facility-administered medications prior to visit.     ROS: Review of Systems  Constitutional: Negative for appetite change, fatigue and unexpected weight change.  HENT: Negative for congestion, nosebleeds, sneezing, sore throat and trouble swallowing.   Eyes: Negative for itching and visual disturbance.  Respiratory: Positive for cough. Negative for shortness of breath and wheezing.   Cardiovascular: Positive for leg swelling. Negative for chest pain and palpitations.  Gastrointestinal: Negative for abdominal distention, blood  in stool, diarrhea and nausea.  Genitourinary: Negative for frequency and hematuria.  Musculoskeletal: Negative for back pain, gait problem, joint swelling and neck pain.  Skin: Negative for rash.  Neurological: Negative for dizziness, tremors, speech difficulty and weakness.  Psychiatric/Behavioral: Positive for decreased concentration. Negative for agitation, dysphoric mood and sleep disturbance. The patient is not nervous/anxious.     Objective:  BP 128/82 (BP Location: Left Arm, Patient Position: Sitting, Cuff Size: Normal)   Pulse (!) 48   Temp (!) 97.5 F (36.4 C) (Oral)   Ht 5\' 10"  (1.778 m)   Wt 164 lb (74.4 kg)   SpO2 99%   BMI 23.53 kg/m   BP Readings from Last 3 Encounters:  01/30/19 128/82  11/21/18 122/78  11/14/18 124/82    Wt Readings from Last 3 Encounters:  01/30/19 164 lb (74.4 kg)  11/21/18 159 lb (72.1 kg)  11/14/18 160 lb (72.6 kg)    Physical Exam Constitutional:      General: He is not in acute distress.    Appearance: He is well-developed.     Comments: NAD  Eyes:     Conjunctiva/sclera: Conjunctivae normal.     Pupils: Pupils are equal, round, and reactive to light.  Neck:     Musculoskeletal: Normal range of motion.     Thyroid: No thyromegaly.     Vascular: No JVD.  Cardiovascular:  Rate and Rhythm: Normal rate and regular rhythm.     Heart sounds: Normal heart sounds. No murmur. No friction rub. No gallop.   Pulmonary:     Effort: Pulmonary effort is normal. No respiratory distress.     Breath sounds: Normal breath sounds. No wheezing or rales.  Chest:     Chest wall: No tenderness.  Abdominal:     General: Bowel sounds are normal. There is no distension.     Palpations: Abdomen is soft. There is no mass.     Tenderness: There is no abdominal tenderness. There is no guarding or rebound.  Musculoskeletal: Normal range of motion.        General: No tenderness.     Right lower leg: Edema present.     Left lower leg: Edema present.   Lymphadenopathy:     Cervical: No cervical adenopathy.  Skin:    General: Skin is warm and dry.     Findings: No rash.  Neurological:     Mental Status: He is alert and oriented to person, place, and time.     Cranial Nerves: No cranial nerve deficit.     Motor: No abnormal muscle tone.     Coordination: Coordination normal.     Gait: Gait normal.     Deep Tendon Reflexes: Reflexes are normal and symmetric.  Psychiatric:        Behavior: Behavior normal.        Thought Content: Thought content normal.        Judgment: Judgment normal.     I personally provided Breo inhaler use teaching. After the teaching patient was able to demonstrate it's use effectively. All questions were answered  Lab Results  Component Value Date   WBC 4.3 11/20/2018   HGB 13.9 11/20/2018   HCT 40.6 11/20/2018   PLT 282.0 11/20/2018   GLUCOSE 109 (H) 01/18/2019   CHOL 132 01/18/2019   TRIG 55.0 01/18/2019   HDL 52.30 01/18/2019   LDLDIRECT 117.3 01/21/2011   LDLCALC 68 01/18/2019   ALT 17 01/18/2019   AST 26 01/18/2019   NA 138 01/18/2019   K 3.7 01/18/2019   CL 101 01/18/2019   CREATININE 0.87 01/18/2019   BUN 10 01/18/2019   CO2 28 01/18/2019   TSH 2.68 11/20/2018   PSA 1.34 11/20/2018   HGBA1C 5.6 10/17/2017    Dg Chest 2 View  Result Date: 10/04/2018 CLINICAL DATA:  Shortness of breath and cough. EXAM: CHEST - 2 VIEW COMPARISON:  None. FINDINGS: The heart size and mediastinal contours are within normal limits. Both lungs are clear. The visualized skeletal structures are unremarkable. IMPRESSION: No active cardiopulmonary disease. Electronically Signed   By: Lorriane Shire M.D.   On: 10/04/2018 15:00    Assessment & Plan:   There are no diagnoses linked to this encounter.   No orders of the defined types were placed in this encounter.    Follow-up: No follow-ups on file.  Walker Kehr, MD

## 2019-02-25 ENCOUNTER — Other Ambulatory Visit: Payer: Self-pay | Admitting: Internal Medicine

## 2019-03-05 ENCOUNTER — Telehealth: Payer: Self-pay | Admitting: Internal Medicine

## 2019-03-05 MED ORDER — LEVOTHYROXINE SODIUM 50 MCG PO TABS: 50.0000 ug | ORAL_TABLET | Freq: Every day | ORAL | 3 refills | Status: DC

## 2019-03-05 NOTE — Telephone Encounter (Signed)
Copied from [place] [id number]. Topic: Quick Communication - Rx Refill/Question >> Mar 05, 2019 10:13 AM Leward Quan A wrote: Medication: levothyroxine (SYNTHROID, LEVOTHROID) 50 MCG tablet  Last requested 02/25/2019  Has the patient contacted their pharmacy? Yes.   (Agent: If no, request that the patient contact the pharmacy for the refill.) (Agent: If yes, when and what did the pharmacy advise?)  Preferred Pharmacy (with phone number or street name): [place], [place].  Phone:  [phone] Fax:  [phone]     Agent: Please be advised that RX refills may take up to 3 business days. We ask that you follow-up with your pharmacy.

## 2019-03-06 MED ORDER — LEVOTHYROXINE SODIUM 50 MCG PO TABS: 50.0000 ug | ORAL_TABLET | Freq: Every day | ORAL | 3 refills | Status: DC

## 2019-03-06 NOTE — Addendum Note (Signed)
Addended by: Karren Cobble on: 03/06/2019 12:32 PM   Modules accepted: Orders

## 2019-03-06 NOTE — Telephone Encounter (Signed)
rx sent

## 2019-03-06 NOTE — Telephone Encounter (Signed)
Patient called to say that he was informed by the pharmacy that the manufacturer for his levothyroxine (SYNTHROID) 50 MCG tablet And need the pharmacy to be contacted to say that it is ok for them to fill this Rx. Per patient he have been without his medication since he first requested the refill on 02/25/2019. Please contact patient.  Ph# [phone]

## 2019-04-15 ENCOUNTER — Ambulatory Visit: Payer: Medicare Other

## 2019-04-27 ENCOUNTER — Encounter: Payer: Self-pay | Admitting: Internal Medicine

## 2019-04-27 ENCOUNTER — Ambulatory Visit (INDEPENDENT_AMBULATORY_CARE_PROVIDER_SITE_OTHER): Payer: Medicare PPO | Admitting: Internal Medicine

## 2019-04-27 ENCOUNTER — Other Ambulatory Visit: Payer: Self-pay

## 2019-04-27 DIAGNOSIS — I2583 Coronary atherosclerosis due to lipid rich plaque: Secondary | ICD-10-CM

## 2019-04-27 DIAGNOSIS — I2510 Atherosclerotic heart disease of native coronary artery without angina pectoris: Secondary | ICD-10-CM | POA: Diagnosis not present

## 2019-04-27 DIAGNOSIS — K921 Melena: Secondary | ICD-10-CM | POA: Diagnosis not present

## 2019-04-27 DIAGNOSIS — N529 Male erectile dysfunction, unspecified: Secondary | ICD-10-CM | POA: Diagnosis not present

## 2019-04-27 DIAGNOSIS — E034 Atrophy of thyroid (acquired): Secondary | ICD-10-CM | POA: Diagnosis not present

## 2019-04-27 DIAGNOSIS — R1013 Epigastric pain: Secondary | ICD-10-CM | POA: Diagnosis not present

## 2019-04-27 NOTE — Progress Notes (Signed)
Virtual Visit via Video Note  I connected with Travis Morse on 04/27/19 at  2:00 PM EST by a video enabled telemedicine application and verified that I am speaking with the correct person using two identifiers.   I discussed the limitations of evaluation and management by telemedicine and the availability of in person appointments. The patient expressed understanding and agreed to proceed.  History of Present Illness: We need to follow-up on hypothyroidism, hypertension, coronary disease.  Travis Morse is complaining of seeing blood on toilet paper several times.  He had a negative Cologuard test sometime ago.  It looks like his last colonoscopy took place in 2011.  There are minor problems with hemorrhoids.  His epigastric discomfort is better.  He never took Prilosec for a unknown reason.  Feet swelling has resolved  He was switched to a new thyroid medication manufacture.  There has been no runny nose, cough, chest pain, shortness of breath, abdominal pain, diarrhea, constipation, arthralgias, skin rashes.   Observations/Objective: The patient appears to be in no acute distress, looks well.  Assessment and Plan:  See my Assessment and Plan. Follow Up Instructions:    I discussed the assessment and treatment plan with the patient. The patient was provided an opportunity to ask questions and all were answered. The patient agreed with the plan and demonstrated an understanding of the instructions.   The patient was advised to call back or seek an in-person evaluation if the symptoms worsen or if the condition fails to improve as anticipated.  I provided face-to-face time during this encounter. We were at different locations.   Walker Kehr, MD

## 2019-04-27 NOTE — Assessment & Plan Note (Signed)
It is spontaneously better.  It could be related to his medications like Crestor.  Possible GERD I asked him to take over-the-counter Prilosec. GI consult with Dr. Henrene Pastor

## 2019-04-27 NOTE — Assessment & Plan Note (Signed)
On Cialis

## 2019-04-27 NOTE — Assessment & Plan Note (Signed)
He is on the new Levothroid by new manufacturer.  Obtain free T4, TSH, bmet

## 2019-04-27 NOTE — Assessment & Plan Note (Signed)
Blood on toilet paper.  Likely related to hemorrhoids.  He had a Cologuard test negative a few months ago. GI consult with Dr. Henrene Pastor

## 2019-04-27 NOTE — Assessment & Plan Note (Signed)
Aspirin and Crestor

## 2019-05-01 ENCOUNTER — Encounter: Payer: Self-pay | Admitting: Nurse Practitioner

## 2019-05-01 ENCOUNTER — Ambulatory Visit: Payer: Medicare Other

## 2019-05-02 ENCOUNTER — Ambulatory Visit: Payer: Medicare Other | Admitting: Internal Medicine

## 2019-05-17 ENCOUNTER — Ambulatory Visit: Payer: Self-pay | Admitting: Nurse Practitioner

## 2019-05-22 ENCOUNTER — Ambulatory Visit: Payer: Medicare Other | Admitting: Internal Medicine

## 2019-06-20 ENCOUNTER — Ambulatory Visit: Payer: Medicare PPO | Admitting: Nurse Practitioner

## 2019-06-20 ENCOUNTER — Encounter: Payer: Self-pay | Admitting: Nurse Practitioner

## 2019-06-20 ENCOUNTER — Other Ambulatory Visit (INDEPENDENT_AMBULATORY_CARE_PROVIDER_SITE_OTHER): Payer: Medicare PPO

## 2019-06-20 VITALS — BP 120/72 | HR 60 | Temp 98.2°F | Ht 70.0 in | Wt 170.6 lb

## 2019-06-20 DIAGNOSIS — Z86010 Personal history of colonic polyps: Secondary | ICD-10-CM | POA: Diagnosis not present

## 2019-06-20 DIAGNOSIS — R1010 Upper abdominal pain, unspecified: Secondary | ICD-10-CM

## 2019-06-20 LAB — HEPATIC FUNCTION PANEL
ALT: 24 U/L (ref 0–53)
AST: 28 U/L (ref 0–37)
Albumin: 4.6 g/dL (ref 3.5–5.2)
Alkaline Phosphatase: 58 U/L (ref 39–117)
Bilirubin, Direct: 0.1 mg/dL (ref 0.0–0.3)
Total Bilirubin: 0.4 mg/dL (ref 0.2–1.2)
Total Protein: 7.8 g/dL (ref 6.0–8.3)

## 2019-06-20 LAB — LIPASE: Lipase: 25 U/L (ref 11.0–59.0)

## 2019-06-20 MED ORDER — NA SULFATE-K SULFATE-MG SULF 17.5-3.13-1.6 GM/177ML PO SOLN: ORAL | 0 refills | Status: DC

## 2019-06-20 NOTE — Progress Notes (Signed)
ASSESSMENT / PLAN:   69 year old male with PMH significant for hypothyroidism, remote colon polyps.  # Intermittent (occasional) painless rectal bleeding --no bleeding since February.  No associated weight loss.  Not anemic on last labs in September 2020.  Probably hemorrhoidal bleeding --Eats high-fiber diet.  Denies constipation/  --Further evaluation of bleeding at time of colonoscopy, see below   # Remote history of adenomatous colon polyps 2002.  --Normal colonoscopy May 2011 but required a lot of irrigating to remove fecal contents from colon. A 5-year recall colonoscopy was recommended. Recall letter mailed April 2016 --Patient is overdue for colonoscopy. The risks and benefits of colonoscopy with possible polypectomy / biopsies were discussed and the patient agrees to proceed.  --He will need a 2-day bowel prep this time  # Mid upper abdominal pain --Started around November. Better but still present.   --Patient nontoxic-appearing, no associated weight loss. --Dyspepsia?  PUD?  Doubt hepatobiliary or pancreatic etiology --Obtain liver tests, lipase --For further evaluation will schedule for EGD to be done at time of colonoscopy. The risks and benefits of EGD were discussed and the patient agrees to proceed.    HPI:     Chief Complaint: Recent rectal bleeding and abdominal pain   Travis Morse is a 69 yo male with pmh as below.  Referred by PCP for evaluation of abdominal pain and rectal bleeding . Patient had a telemedicine visit with PCP in February with complaints of seeing blood on the toilet paper with bowel movements.  He says this generally occurs a few times a year but in February it occurred a couple times within a 2 or so weeks span.  He had no associated rectal pain.  He says the bleeding was not associated with constipation.  He has not had any bleeding since February.  His last colonoscopy in 2011.  Recommended repeat at 5 years due to  suboptimally prepped bowel. He had a negative Cologuard October 2018  In addition to the self-limited rectal bleeding patient mentions upper abdominal pain.  He is not quite sure when this started, maybe around November but it did get progressively worse.  Patient said he had been taking some sort of a supplement for calcium buildup in his arteries.  He thought the abdominal pain may be originating from that.  He was taking 10-15 Tums or similar antacids a day without much improvement so he spoke with a friend in oriental medicine who recommended apple cider vinegar.  He took this for a few weeks and the pain did significantly improve.  The pain got to the point where it was just barely noticeable, sort of in the background where it has remained.  He never did notice any relationship between eating and the upper abdominal pain but in hindsight thinks green tea may have exacerbated the pain at times.  He did not have any associated nausea or vomiting.  No weight loss in fact he gained several pounds through the COVID pandemic  Data Reviewed:  01/18/2019, c-Met was okay 11/20/2018, CBC normal   Past Medical History:  Diagnosis Date  . Allergy    allergic to bee stings  . Anxiety   . Depression   . History of kidney stones   . Hx of colonic polyps    Dr Henrene Pastor  . Hypothyroidism 2008  . Insomnia   . Memory difficulty   . Multiple thyroid nodules  Past Surgical History:  Procedure Laterality Date  . Arm fracture     as a child  . COLONOSCOPY    . OPEN REDUCTION INTERNAL FIXATION (ORIF) METACARPAL Right 10/23/2016   Procedure: Right thumb closed reduction and pinning;  Surgeon: Iran Planas, MD;  Location: [facility];  Service: Orthopedics;  Laterality: Right;  . THYROIDECTOMY, PARTIAL    . TIBIA FRACTURE SURGERY     Family History  Problem Relation Age of Onset  . Coronary artery disease Father        57's  . Heart disease Father 84       MI  . Thyroid disease Neg Hx    Social  History   Tobacco Use  . Smoking status: Never Smoker  . Smokeless tobacco: Never Used  Substance Use Topics  . Alcohol use: Yes    Comment: 1 glass a wine a day   . Drug use: No   Current Outpatient Medications  Medication Sig Dispense Refill  . aspirin EC 81 MG tablet Take 1 tablet (81 mg total) by mouth daily. 100 tablet 3  . b complex vitamins tablet Take 1 tablet by mouth daily.    . Beet Root POWD Take by mouth 2 (two) times daily. Mixes 1 teasppon in 6 oz of water    . Cholecalciferol (VITAMIN D) 2000 units tablet Take 2,000 Units by mouth daily.    . fish oil-omega-3 fatty acids 1000 MG capsule Take 4,000 mg by mouth 2 (two) times daily.     . fluticasone furoate-vilanterol (BREO ELLIPTA) 100-25 MCG/INH AEPB Inhale 1 puff into the lungs daily. 1 each 3  . furosemide (LASIX) 20 MG tablet Take 1 tablet (20 mg total) by mouth daily as needed for edema. 30 tablet 3  . levothyroxine (SYNTHROID) 50 MCG tablet Take 1 tablet (50 mcg total) by mouth daily. 90 tablet 3  . Multiple Vitamin (MULTIVITAMIN WITH MINERALS) TABS tablet Take 1 tablet by mouth daily.    . rosuvastatin (CRESTOR) 10 MG tablet Take 10 mg by mouth daily.    . tadalafil (CIALIS) 5 MG tablet Take 1-2 tablets (5-10 mg total) by mouth daily as needed for erectile dysfunction. 90 tablet 11  . TURMERIC PO Take 1 tablet by mouth 2 (two) times daily. Less than a teaspoonful -takes with tomato paste and pepper     No current facility-administered medications for this visit.   Allergies  Allergen Reactions  . Penicillins Rash    The patient was very young and he does remember the type of reaction.  Has patient had a PCN reaction causing immediate rash, facial/tongue/throat swelling, SOB or lightheadedness with hypotension: Yes Has patient had a PCN reaction causing severe rash involving mucus membranes or skin necrosis: No Has patient had a PCN reaction that required hospitalization: No Has patient had a PCN reaction  occurring within the last 10 years: No If all of the above answers are "NO", then may proceed with Cephalo     Review of Systems: All other systems reviewed and negative except where noted in HPI.   Creatinine clearance cannot be calculated (Patient's most recent lab result is older than the maximum 21 days allowed.)   Physical Exam:    Wt Readings from Last 3 Encounters:  06/20/19 170 lb 9.6 oz (77.4 kg)  01/30/19 164 lb (74.4 kg)  11/21/18 159 lb (72.1 kg)    Temp 98.2 F (36.8 C)   Wt 170 lb 9.6 oz (77.4 kg)  BMI 24.48 kg/m  Constitutional:  Well developed male in no acute distress. Psychiatric: Normal mood and affect. Behavior is normal. EENT: Pupils normal.  Conjunctivae are normal. No scleral icterus. Neck supple.  Cardiovascular: Normal rate, regular rhythm. No edema Pulmonary/chest: Effort normal and breath sounds normal. No wheezing, rales or rhonchi. Abdominal: Soft, nondistended, nontender. Bowel sounds active throughout. There are no masses palpable. No hepatomegaly. Neurological: Alert and oriented to person place and time. Skin: Skin is warm and dry. No rashes noted.  Tye Savoy, NP  06/20/2019, 1:35 PM  Cc:  Referring Provider Plotnikov, Evie Lacks, MD

## 2019-06-20 NOTE — Patient Instructions (Signed)
If you are age 70 or older, your body mass index should be between 23-30. Your Body mass index is 24.48 kg/m. If this is out of the aforementioned range listed, please consider follow up with your Primary Care Provider.  If you are age 37 or younger, your body mass index should be between 19-25. Your Body mass index is 24.48 kg/m. If this is out of the aformentioned range listed, please consider follow up with your Primary Care Provider.   You have been scheduled for an endoscopy and colonoscopy. Please follow the written instructions given to you at your visit today. Please pick up your prep supplies at the pharmacy within the next 1-3 days. If you use inhalers (even only as needed), please bring them with you on the day of your procedure. Your physician has requested that you go to www.startemmi.com and enter the access code given to you at your visit today. This web site gives a general overview about your procedure. However, you should still follow specific instructions given to you by our office regarding your preparation for the procedure.  We have sent the following medications to your pharmacy for you to pick up at your convenience: [place] provider has requested that you go to the basement level for lab work before leaving today. Press "B" on the elevator. The lab is located at the first door on the left as you exit the elevator.  Thank you for choosing me and [facility].   Tye Savoy, NP

## 2019-06-21 NOTE — Progress Notes (Signed)
Reviewed

## 2019-07-18 ENCOUNTER — Telehealth: Payer: Self-pay | Admitting: Internal Medicine

## 2019-07-18 NOTE — Telephone Encounter (Signed)
Hi Dr. Henrene Pastor, this pt left a message with the answering service yesterday cancelling his colonoscopy with you that was scheduled for this Friday 5/14. He did not provide a reason for cancellation. I called him this morning and left a message to call back. Thank you.

## 2019-07-20 ENCOUNTER — Encounter: Payer: Medicare PPO | Admitting: Internal Medicine

## 2019-07-25 DIAGNOSIS — Z85820 Personal history of malignant melanoma of skin: Secondary | ICD-10-CM | POA: Diagnosis not present

## 2019-07-25 DIAGNOSIS — L812 Freckles: Secondary | ICD-10-CM | POA: Diagnosis not present

## 2019-07-25 DIAGNOSIS — Z85828 Personal history of other malignant neoplasm of skin: Secondary | ICD-10-CM | POA: Diagnosis not present

## 2019-07-25 DIAGNOSIS — L821 Other seborrheic keratosis: Secondary | ICD-10-CM | POA: Diagnosis not present

## 2019-07-25 DIAGNOSIS — B353 Tinea pedis: Secondary | ICD-10-CM | POA: Diagnosis not present

## 2019-08-20 ENCOUNTER — Ambulatory Visit (AMBULATORY_SURGERY_CENTER): Payer: Medicare PPO | Admitting: Internal Medicine

## 2019-08-20 ENCOUNTER — Encounter: Payer: Self-pay | Admitting: Internal Medicine

## 2019-08-20 ENCOUNTER — Other Ambulatory Visit: Payer: Self-pay

## 2019-08-20 VITALS — BP 125/72 | HR 51 | Temp 97.3°F | Resp 11 | Ht 70.0 in | Wt 170.0 lb

## 2019-08-20 DIAGNOSIS — E039 Hypothyroidism, unspecified: Secondary | ICD-10-CM | POA: Diagnosis not present

## 2019-08-20 DIAGNOSIS — R1010 Upper abdominal pain, unspecified: Secondary | ICD-10-CM | POA: Diagnosis not present

## 2019-08-20 DIAGNOSIS — E785 Hyperlipidemia, unspecified: Secondary | ICD-10-CM | POA: Diagnosis not present

## 2019-08-20 DIAGNOSIS — Z86010 Personal history of colonic polyps: Secondary | ICD-10-CM | POA: Diagnosis not present

## 2019-08-20 DIAGNOSIS — K253 Acute gastric ulcer without hemorrhage or perforation: Secondary | ICD-10-CM | POA: Diagnosis not present

## 2019-08-20 MED ORDER — PANTOPRAZOLE SODIUM 40 MG PO TBEC
40.0000 mg | DELAYED_RELEASE_TABLET | Freq: Every day | ORAL | 6 refills | Status: DC
Start: 2019-08-20 — End: 2020-11-25

## 2019-08-20 MED ORDER — SODIUM CHLORIDE 0.9 % IV SOLN: 500.0000 mL | Freq: Once | INTRAVENOUS | Status: DC

## 2019-08-20 NOTE — Progress Notes (Signed)
Called to room to assist during endoscopic procedure.  Patient ID and intended procedure confirmed with present staff. Received instructions for my participation in the procedure from the performing physician.  

## 2019-08-20 NOTE — Op Note (Signed)
[facility] Patient Name: Travis Morse Procedure Date: 08/20/2019 2:43 PM MRN: [id number] Endoscopist: Docia Chuck. Henrene Pastor , MD Age: 69 Referring MD:  Date of Birth: [birth year] Gender: Male Account #: [id number] Procedure:                Upper GI endoscopy with biopsies Indications:              Abdominal pain, doing better recently Medicines:                Monitored Anesthesia Care Procedure:                Pre-Anesthesia Assessment:                           - Prior to the procedure, a History and Physical                            was performed, and patient medications and                            allergies were reviewed. The patient's tolerance of                            previous anesthesia was also reviewed. The risks                            and benefits of the procedure and the sedation                            options and risks were discussed with the patient.                            All questions were answered, and informed consent                            was obtained. Prior Anticoagulants: The patient has                            taken no previous anticoagulant or antiplatelet                            agents. ASA Grade Assessment: II - A patient with                            mild systemic disease. After reviewing the risks                            and benefits, the patient was deemed in                            satisfactory condition to undergo the procedure.                           After obtaining informed consent, the endoscope was  passed under direct vision. Throughout the                            procedure, the patient's blood pressure, pulse, and                            oxygen saturations were monitored continuously. The                            Endoscope was introduced through the mouth, and                            advanced to the second part of duodenum. The upper                            GI  endoscopy was accomplished without difficulty.                            The patient tolerated the procedure well. Scope In: Scope Out: Findings:                 The esophagus was normal.                           The stomach revealed multiple superficial antral                            erosions with hematin. Biopsies were taken with a                            cold forceps for Helicobacter pylori testing using                            CLOtest.                           The examined duodenum revealed bulbar and bulbar/D2                            junction superficial erosions.                           The cardia and gastric fundus were normal on                            retroflexion, save hiatal hernia. Complications:            No immediate complications. Estimated Blood Loss:     Estimated blood loss: none. Impression:               1. Antral and duodenal erosions                           2. Otherwise unremarkable EGD. Recommendation:           1. Prescribe pantoprazole 40 mg daily; #30; 6  refills. Recommend taking this for 2 months to                            clear up inflammation of the stomach and duodenum.                           2. Follow-up CLO biopsy and treat if positive                           3. Avoid unnecessary NSAIDs as this can cause ulcers                           4. Return to the care of your primary provider. GI                            follow-up as needed Docia Chuck. Henrene Pastor, MD 08/20/2019 3:24:06 PM This report has been signed electronically.

## 2019-08-20 NOTE — Progress Notes (Signed)
Vs cw  

## 2019-08-20 NOTE — Patient Instructions (Signed)
Impression/Recommendations:   Hemorrhoid handout given to patient.  Pantoprazole 40 mg. Daily for 2 months.  Avoid unnecessary NSAIDs.  Repeat colonoscopy in 5 years for surveillance.  Resume previous diet. Continue present medications.  Return to the care of your primary provider.  GI follow-up as needed.  YOU HAD AN ENDOSCOPIC PROCEDURE TODAY AT [facility]:   Refer to the procedure report that was given to you for any specific questions about what was found during the examination.  If the procedure report does not answer your questions, please call your gastroenterologist to clarify.  If you requested that your care partner not be given the details of your procedure findings, then the procedure report has been included in a sealed envelope for you to review at your convenience later.  YOU SHOULD EXPECT: Some feelings of bloating in the abdomen. Passage of more gas than usual.  Walking can help get rid of the air that was put into your GI tract during the procedure and reduce the bloating. If you had a lower endoscopy (such as a colonoscopy or flexible sigmoidoscopy) you may notice spotting of blood in your stool or on the toilet paper. If you underwent a bowel prep for your procedure, you may not have a normal bowel movement for a few days.  Please Note:  You might notice some irritation and congestion in your nose or some drainage.  This is from the oxygen used during your procedure.  There is no need for concern and it should clear up in a day or so.  SYMPTOMS TO REPORT IMMEDIATELY:   Following lower endoscopy (colonoscopy or flexible sigmoidoscopy):  Excessive amounts of blood in the stool  Significant tenderness or worsening of abdominal pains  Swelling of the abdomen that is new, acute  Fever of 100F or higher   Following upper endoscopy (EGD)  Vomiting of blood or coffee ground material  New chest pain or pain under the shoulder blades  Painful or  persistently difficult swallowing  New shortness of breath  Fever of 100F or higher  Black, tarry-looking stools  For urgent or emergent issues, a gastroenterologist can be reached at any hour by calling [phone]. Do not use MyChart messaging for urgent concerns.    DIET:  We do recommend a small meal at first, but then you may proceed to your regular diet.  Drink plenty of fluids but you should avoid alcoholic beverages for 24 hours.  ACTIVITY:  You should plan to take it easy for the rest of today and you should NOT DRIVE or use heavy machinery until tomorrow (because of the sedation medicines used during the test).    FOLLOW UP: Our staff will call the number listed on your records 48-72 hours following your procedure to check on you and address any questions or concerns that you may have regarding the information given to you following your procedure. If we do not reach you, we will leave a message.  We will attempt to reach you two times.  During this call, we will ask if you have developed any symptoms of COVID 19. If you develop any symptoms (ie: fever, flu-like symptoms, shortness of breath, cough etc.) before then, please call [phone].  If you test positive for Covid 19 in the 2 weeks post procedure, please call and report this information to [place].    If any biopsies were taken you will be contacted by phone or by letter within the next 1-3 weeks.  Please  call us at [phone] if you have not heard about the biopsies in 3 weeks.    SIGNATURES/CONFIDENTIALITY: You and/or your care partner have signed paperwork which will be entered into your electronic medical record.  These signatures attest to the fact that that the information above on your After Visit Summary has been reviewed and is understood.  Full responsibility of the confidentiality of this discharge information lies with you and/or your care-partner.

## 2019-08-20 NOTE — Op Note (Signed)
[facility] Patient Name: Travis Morse Procedure Date: 08/20/2019 2:43 PM MRN: [id number] Endoscopist: Docia Chuck. Henrene Pastor , MD Age: 69 Referring MD:  Date of Birth: [birth year] Gender: Male Account #: [id number] Procedure:                Colonoscopy Indications:              High risk colon cancer surveillance: Personal                            history of non-advanced adenoma (2002); last                            examination 2011 (no polyps) Medicines:                Monitored Anesthesia Care Procedure:                Pre-Anesthesia Assessment:                           - Prior to the procedure, a History and Physical                            was performed, and patient medications and                            allergies were reviewed. The patient's tolerance of                            previous anesthesia was also reviewed. The risks                            and benefits of the procedure and the sedation                            options and risks were discussed with the patient.                            All questions were answered, and informed consent                            was obtained. Prior Anticoagulants: The patient has                            taken no previous anticoagulant or antiplatelet                            agents. ASA Grade Assessment: II - A patient with                            mild systemic disease. After reviewing the risks                            and benefits, the patient was deemed in  satisfactory condition to undergo the procedure.                           After obtaining informed consent, the colonoscope                            was passed under direct vision. Throughout the                            procedure, the patient's blood pressure, pulse, and                            oxygen saturations were monitored continuously. The                            Colonoscope was introduced through the  anus and                            advanced to the the cecum, identified by                            appendiceal orifice and ileocecal valve. The                            ileocecal valve, appendiceal orifice, and rectum                            were photographed. The quality of the bowel                            preparation was adequate to identify polyps 6 mm                            and larger in size. The colonoscopy was performed                            without difficulty. The patient tolerated the                            procedure well. The bowel preparation was 2 DAY                            used was magnesium citrate followed by SUPREP via                            split dose instruction. Scope In: 2:51:50 PM Scope Out: 3:08:25 PM Scope Withdrawal Time: 0 hours 13 minutes 49 seconds  Total Procedure Duration: 0 hours 16 minutes 35 seconds  Findings:                 Internal hemorrhoids were found during                            retroflexion. There were some limitations,  particularly in the right colon, due to seed and                            vegetative debris                           The exam was otherwise without abnormality on                            direct and retroflexion views. Complications:            No immediate complications. Estimated blood loss:                            None. Estimated Blood Loss:     Estimated blood loss: none. Impression:               - Internal hemorrhoids.                           - The examination was otherwise normal on direct                            and retroflexion views.                           - No specimens collected. Recommendation:           - Repeat colonoscopy in 5 years for surveillance                            with extensive preparation.                           - Patient has a contact number available for                            emergencies. The signs and  symptoms of potential                            delayed complications were discussed with the                            patient. Return to normal activities tomorrow.                            Written discharge instructions were provided to the                            patient.                           - Resume previous diet.                           - Continue present medications. Docia Chuck. Henrene Pastor, MD 08/20/2019 3:13:59 PM This report has been signed electronically.

## 2019-08-20 NOTE — Progress Notes (Signed)
Report to PACU, RN, vss, BBS= Clear.  

## 2019-08-21 ENCOUNTER — Ambulatory Visit: Payer: Medicare PPO | Admitting: Internal Medicine

## 2019-08-21 ENCOUNTER — Encounter: Payer: Self-pay | Admitting: Internal Medicine

## 2019-08-21 DIAGNOSIS — I2583 Coronary atherosclerosis due to lipid rich plaque: Secondary | ICD-10-CM | POA: Diagnosis not present

## 2019-08-21 DIAGNOSIS — I2510 Atherosclerotic heart disease of native coronary artery without angina pectoris: Secondary | ICD-10-CM | POA: Diagnosis not present

## 2019-08-21 DIAGNOSIS — E034 Atrophy of thyroid (acquired): Secondary | ICD-10-CM | POA: Diagnosis not present

## 2019-08-21 DIAGNOSIS — R413 Other amnesia: Secondary | ICD-10-CM | POA: Diagnosis not present

## 2019-08-21 DIAGNOSIS — R931 Abnormal findings on diagnostic imaging of heart and coronary circulation: Secondary | ICD-10-CM | POA: Diagnosis not present

## 2019-08-21 LAB — BASIC METABOLIC PANEL
BUN: 8 mg/dL (ref 6–23)
CO2: 28 mEq/L (ref 19–32)
Calcium: 9.7 mg/dL (ref 8.4–10.5)
Chloride: 103 mEq/L (ref 96–112)
Creatinine, Ser: 0.78 mg/dL (ref 0.40–1.50)
GFR: 98.51 mL/min (ref >60.00)
Glucose, Bld: 110 mg/dL — ABNORMAL HIGH (ref 70–99)
Potassium: 4 mEq/L (ref 3.5–5.1)
Sodium: 137 mEq/L (ref 135–145)

## 2019-08-21 LAB — TSH: TSH: 2.02 u[IU]/mL (ref 0.35–4.50)

## 2019-08-21 LAB — HELICOBACTER PYLORI SCREEN-BIOPSY: UREASE: NEGATIVE

## 2019-08-21 LAB — T4, FREE: Free T4: 0.85 ng/dL (ref 0.60–1.60)

## 2019-08-21 NOTE — Patient Instructions (Signed)

## 2019-08-21 NOTE — Assessment & Plan Note (Signed)
Crestor ASA 

## 2019-08-21 NOTE — Progress Notes (Signed)
Subjective:  Patient ID: Travis Morse, male    DOB: November 12, 1950  Age: 69 y.o. MRN: [id number]  CC: No chief complaint on file.   HPI SHAMERE CAMPAS presents for gastric erosions, CAD, BPH f/u  Outpatient Medications Prior to Visit  Medication Sig Dispense Refill  . aspirin EC 81 MG tablet Take 1 tablet (81 mg total) by mouth daily. 100 tablet 3  . b complex vitamins tablet Take 1 tablet by mouth daily.    . Beet Root POWD Take by mouth 2 (two) times daily. Mixes 1 teasppon in 6 oz of water    . Cholecalciferol (VITAMIN D) 2000 units tablet Take 2,000 Units by mouth daily.    . Coenzyme Q10 (CO Q 10 PO) Take by mouth daily.    . fish oil-omega-3 fatty acids 1000 MG capsule Take 4,000 mg by mouth 2 (two) times daily.     Marland Kitchen glucosamine-chondroitin 500-400 MG tablet Take 1 tablet by mouth daily.    Marland Kitchen levothyroxine (SYNTHROID) 50 MCG tablet Take 1 tablet (50 mcg total) by mouth daily. 90 tablet 3  . Multiple Vitamin (MULTIVITAMIN WITH MINERALS) TABS tablet Take 1 tablet by mouth daily.    . pantoprazole (PROTONIX) 40 MG tablet Take 1 tablet (40 mg total) by mouth daily. 30 tablet 6  . rosuvastatin (CRESTOR) 10 MG tablet Take 10 mg by mouth daily.    . tadalafil (CIALIS) 5 MG tablet Take 1-2 tablets (5-10 mg total) by mouth daily as needed for erectile dysfunction. 90 tablet 11  . TURMERIC PO Take 1 tablet by mouth 2 (two) times daily. Less than a teaspoonful -takes with tomato paste and pepper    . ketoconazole (NIZORAL) 2 % cream  (Patient not taking: Reported on 08/21/2019)     No facility-administered medications prior to visit.    ROS: Review of Systems  Constitutional: Positive for unexpected weight change. Negative for appetite change and fatigue.  HENT: Negative for congestion, nosebleeds, sneezing, sore throat and trouble swallowing.   Eyes: Negative for itching and visual disturbance.  Respiratory: Negative for cough.   Cardiovascular: Negative for chest pain,  palpitations and leg swelling.  Gastrointestinal: Negative for abdominal distention, blood in stool, diarrhea and nausea.  Genitourinary: Negative for frequency and hematuria.  Musculoskeletal: Negative for back pain, gait problem, joint swelling and neck pain.  Skin: Negative for rash.  Neurological: Negative for dizziness, tremors, speech difficulty and weakness.  Psychiatric/Behavioral: Negative for agitation, dysphoric mood, sleep disturbance and suicidal ideas. The patient is not nervous/anxious.     Objective:  BP 118/82 (BP Location: Left Arm, Patient Position: Sitting, Cuff Size: Normal)   Pulse 77   Temp 98.7 F (37.1 C) (Oral)   Ht 5\' 10"  (1.778 m)   Wt 165 lb (74.8 kg)   SpO2 95%   BMI 23.68 kg/m   BP Readings from Last 3 Encounters:  08/21/19 118/82  08/20/19 125/72  06/20/19 120/72    Wt Readings from Last 3 Encounters:  08/21/19 165 lb (74.8 kg)  08/20/19 170 lb (77.1 kg)  06/20/19 170 lb 9.6 oz (77.4 kg)    Physical Exam Constitutional:      General: He is not in acute distress.    Appearance: He is well-developed.     Comments: NAD  Eyes:     Conjunctiva/sclera: Conjunctivae normal.     Pupils: Pupils are equal, round, and reactive to light.  Neck:     Thyroid: No thyromegaly.  Vascular: No JVD.  Cardiovascular:     Rate and Rhythm: Normal rate and regular rhythm.     Heart sounds: Normal heart sounds. No murmur heard.  No friction rub. No gallop.   Pulmonary:     Effort: Pulmonary effort is normal. No respiratory distress.     Breath sounds: Normal breath sounds. No wheezing or rales.  Chest:     Chest wall: No tenderness.  Abdominal:     General: Bowel sounds are normal. There is no distension.     Palpations: Abdomen is soft. There is no mass.     Tenderness: There is no abdominal tenderness. There is no guarding or rebound.  Musculoskeletal:        General: No tenderness. Normal range of motion.     Cervical back: Normal range of  motion.  Lymphadenopathy:     Cervical: No cervical adenopathy.  Skin:    General: Skin is warm and dry.     Findings: No rash.  Neurological:     Mental Status: He is alert and oriented to person, place, and time.     Cranial Nerves: No cranial nerve deficit.     Motor: No abnormal muscle tone.     Coordination: Coordination normal.     Gait: Gait normal.     Deep Tendon Reflexes: Reflexes are normal and symmetric.  Psychiatric:        Behavior: Behavior normal.        Thought Content: Thought content normal.        Judgment: Judgment normal.     Lab Results  Component Value Date   WBC 4.3 11/20/2018   HGB 13.9 11/20/2018   HCT 40.6 11/20/2018   PLT 282.0 11/20/2018   GLUCOSE 109 (H) 01/18/2019   CHOL 132 01/18/2019   TRIG 55.0 01/18/2019   HDL 52.30 01/18/2019   LDLDIRECT 117.3 01/21/2011   LDLCALC 68 01/18/2019   ALT 24 06/20/2019   AST 28 06/20/2019   NA 138 01/18/2019   K 3.7 01/18/2019   CL 101 01/18/2019   CREATININE 0.87 01/18/2019   BUN 10 01/18/2019   CO2 28 01/18/2019   TSH 2.68 11/20/2018   PSA 1.34 11/20/2018   HGBA1C 5.6 10/17/2017    DG Chest 2 View  Result Date: 10/04/2018 CLINICAL DATA:  Shortness of breath and cough. EXAM: CHEST - 2 VIEW COMPARISON:  None. FINDINGS: The heart size and mediastinal contours are within normal limits. Both lungs are clear. The visualized skeletal structures are unremarkable. IMPRESSION: No active cardiopulmonary disease. Electronically Signed   By: Lorriane Shire M.D.   On: 10/04/2018 15:00    Assessment & Plan:    Walker Kehr, MD

## 2019-08-22 ENCOUNTER — Telehealth: Payer: Self-pay | Admitting: *Deleted

## 2019-08-22 ENCOUNTER — Telehealth: Payer: Self-pay

## 2019-08-22 NOTE — Telephone Encounter (Signed)
Second post procedure follow up call, no answer 

## 2019-08-22 NOTE — Telephone Encounter (Signed)
Follow up call made, no answer, left message 

## 2019-08-28 ENCOUNTER — Telehealth: Payer: Self-pay | Admitting: Internal Medicine

## 2019-08-28 NOTE — Telephone Encounter (Signed)
New message:   Pt is calling and would like to know when his last tetanus shot was given. Please advise.

## 2019-08-28 NOTE — Telephone Encounter (Signed)
Called pt and informed of his most recent tetanus

## 2019-09-02 ENCOUNTER — Encounter: Payer: Self-pay | Admitting: Internal Medicine

## 2019-09-02 NOTE — Assessment & Plan Note (Signed)
stable °

## 2019-11-18 ENCOUNTER — Other Ambulatory Visit: Payer: Self-pay | Admitting: Internal Medicine

## 2019-11-18 DIAGNOSIS — R4589 Other symptoms and signs involving emotional state: Secondary | ICD-10-CM

## 2019-11-18 DIAGNOSIS — Z0000 Encounter for general adult medical examination without abnormal findings: Secondary | ICD-10-CM

## 2019-11-19 ENCOUNTER — Other Ambulatory Visit (INDEPENDENT_AMBULATORY_CARE_PROVIDER_SITE_OTHER): Payer: Medicare PPO

## 2019-11-19 DIAGNOSIS — Z125 Encounter for screening for malignant neoplasm of prostate: Secondary | ICD-10-CM

## 2019-11-19 DIAGNOSIS — Z0000 Encounter for general adult medical examination without abnormal findings: Secondary | ICD-10-CM | POA: Diagnosis not present

## 2019-11-19 DIAGNOSIS — R4589 Other symptoms and signs involving emotional state: Secondary | ICD-10-CM

## 2019-11-19 LAB — CBC WITH DIFFERENTIAL/PLATELET
Basophils Absolute: 0.1 10*3/uL (ref 0.0–0.1)
Basophils Relative: 1 % (ref 0.0–3.0)
Eosinophils Absolute: 0.3 10*3/uL (ref 0.0–0.7)
Eosinophils Relative: 5 % (ref 0.0–5.0)
HCT: 41.5 % (ref 39.0–52.0)
Hemoglobin: 14.5 g/dL (ref 13.0–17.0)
Lymphocytes Relative: 24.9 % (ref 12.0–46.0)
Lymphs Abs: 1.4 10*3/uL (ref 0.7–4.0)
MCHC: 34.9 g/dL (ref 30.0–36.0)
MCV: 95.9 fl (ref 78.0–100.0)
Monocytes Absolute: 0.4 10*3/uL (ref 0.1–1.0)
Monocytes Relative: 6.9 % (ref 3.0–12.0)
Neutro Abs: 3.6 10*3/uL (ref 1.4–7.7)
Neutrophils Relative %: 62.2 % (ref 43.0–77.0)
Platelets: 224 10*3/uL (ref 150.0–400.0)
RBC: 4.33 Mil/uL (ref 4.22–5.81)
RDW: 12.8 % (ref 11.5–15.5)
WBC: 5.8 10*3/uL (ref 4.0–10.5)

## 2019-11-19 LAB — PSA: PSA: 1.14 ng/mL (ref 0.10–4.00)

## 2019-11-19 LAB — COMPREHENSIVE METABOLIC PANEL
ALT: 16 U/L (ref 0–53)
AST: 25 U/L (ref 0–37)
Albumin: 4.4 g/dL (ref 3.5–5.2)
Alkaline Phosphatase: 50 U/L (ref 39–117)
BUN: 10 mg/dL (ref 6–23)
CO2: 31 mEq/L (ref 19–32)
Calcium: 9.6 mg/dL (ref 8.4–10.5)
Chloride: 102 mEq/L (ref 96–112)
Creatinine, Ser: 0.8 mg/dL (ref 0.40–1.50)
GFR: 95.6 mL/min (ref >60.00)
Glucose, Bld: 107 mg/dL — ABNORMAL HIGH (ref 70–99)
Potassium: 4.5 mEq/L (ref 3.5–5.1)
Sodium: 140 mEq/L (ref 135–145)
Total Bilirubin: 0.4 mg/dL (ref 0.2–1.2)
Total Protein: 7.2 g/dL (ref 6.0–8.3)

## 2019-11-19 LAB — LIPID PANEL
Cholesterol: 131 mg/dL (ref 0–200)
HDL: 58.4 mg/dL (ref >39.00)
LDL Cholesterol: 61 mg/dL (ref 0–99)
NonHDL: 72.93
Total CHOL/HDL Ratio: 2
Triglycerides: 58 mg/dL (ref 0.0–149.0)
VLDL: 11.6 mg/dL (ref 0.0–40.0)

## 2019-11-19 LAB — TESTOSTERONE: Testosterone: 330.53 ng/dL (ref 300.00–890.00)

## 2019-11-19 LAB — TSH: TSH: 2.71 u[IU]/mL (ref 0.35–4.50)

## 2019-11-19 NOTE — Addendum Note (Signed)
Addended by: Trenda Moots on: 9/47/1252 03:45 PM   Modules accepted: Orders

## 2019-11-20 ENCOUNTER — Other Ambulatory Visit (INDEPENDENT_AMBULATORY_CARE_PROVIDER_SITE_OTHER): Payer: Medicare PPO

## 2019-11-20 DIAGNOSIS — Z0000 Encounter for general adult medical examination without abnormal findings: Secondary | ICD-10-CM | POA: Diagnosis not present

## 2019-11-21 LAB — URINALYSIS
Bilirubin Urine: NEGATIVE
Hgb urine dipstick: NEGATIVE
Ketones, ur: NEGATIVE
Leukocytes,Ua: NEGATIVE
Nitrite: NEGATIVE
Specific Gravity, Urine: 1.01 (ref 1.000–1.030)
Total Protein, Urine: NEGATIVE
Urine Glucose: NEGATIVE
Urobilinogen, UA: 0.2 (ref 0.0–1.0)
pH: 7 (ref 5.0–8.0)

## 2019-11-22 ENCOUNTER — Other Ambulatory Visit: Payer: Self-pay

## 2019-11-22 ENCOUNTER — Ambulatory Visit (INDEPENDENT_AMBULATORY_CARE_PROVIDER_SITE_OTHER): Payer: Medicare PPO | Admitting: Internal Medicine

## 2019-11-22 ENCOUNTER — Encounter: Payer: Self-pay | Admitting: Internal Medicine

## 2019-11-22 VITALS — BP 100/62 | HR 57 | Temp 98.0°F | Ht 71.0 in | Wt 163.0 lb

## 2019-11-22 DIAGNOSIS — I2510 Atherosclerotic heart disease of native coronary artery without angina pectoris: Secondary | ICD-10-CM | POA: Diagnosis not present

## 2019-11-22 DIAGNOSIS — L8990 Pressure ulcer of unspecified site, unspecified stage: Secondary | ICD-10-CM | POA: Insufficient documentation

## 2019-11-22 DIAGNOSIS — Z0000 Encounter for general adult medical examination without abnormal findings: Secondary | ICD-10-CM | POA: Diagnosis not present

## 2019-11-22 DIAGNOSIS — I2583 Coronary atherosclerosis due to lipid rich plaque: Secondary | ICD-10-CM | POA: Diagnosis not present

## 2019-11-22 DIAGNOSIS — E034 Atrophy of thyroid (acquired): Secondary | ICD-10-CM

## 2019-11-22 DIAGNOSIS — N529 Male erectile dysfunction, unspecified: Secondary | ICD-10-CM

## 2019-11-22 DIAGNOSIS — L89322 Pressure ulcer of left buttock, stage 2: Secondary | ICD-10-CM | POA: Diagnosis not present

## 2019-11-22 MED ORDER — STENDRA 100 MG PO TABS: 100.0000 mg | ORAL_TABLET | Freq: Every day | ORAL | 5 refills | Status: DC | PRN

## 2019-11-22 NOTE — Progress Notes (Signed)
Subjective:  Patient ID: Travis Morse, male    DOB: [birth year]  Age: 69 y.o. MRN: [id number]  CC: No chief complaint on file.   HPI Travis Morse presents for a well exam.  He has been doing well.  He is planning to ski a lot this winter.  C/o ED problems sometimes.  C/o sore on the R buttock: Travis Morse has been spending a lot of time online sitting on a hardwood chair.  Outpatient Medications Prior to Visit  Medication Sig Dispense Refill  . b complex vitamins tablet Take 1 tablet by mouth daily.    . Beet Root POWD Take by mouth 2 (two) times daily. Mixes 1 tbsp in 6 oz of water    . Cholecalciferol (VITAMIN D) 2000 units tablet Take 2,000 Units by mouth daily.    . Coenzyme Q10 (CO Q 10 PO) Take by mouth daily.    . fish oil-omega-3 fatty acids 1000 MG capsule Take 4,000 mg by mouth 2 (two) times daily.     Marland Kitchen GLUCOSAMINE SULFATE PO Take 2 tablets by mouth daily.    Marland Kitchen ketoconazole (NIZORAL) 2 % cream     . levothyroxine (SYNTHROID) 50 MCG tablet Take 1 tablet (50 mcg total) by mouth daily. 90 tablet 3  . Multiple Vitamin (MULTIVITAMIN WITH MINERALS) TABS tablet Take 1 tablet by mouth daily.    . pantoprazole (PROTONIX) 40 MG tablet Take 1 tablet (40 mg total) by mouth daily. 30 tablet 6  . rosuvastatin (CRESTOR) 10 MG tablet Take 10 mg by mouth every other day.     . tadalafil (CIALIS) 5 MG tablet Take 1-2 tablets (5-10 mg total) by mouth daily as needed for erectile dysfunction. 90 tablet 11  . TURMERIC PO Take 1 tablet by mouth 2 (two) times daily. Less than a teaspoonful -takes with tomato paste and pepper    . glucosamine-chondroitin 500-400 MG tablet Take 1 tablet by mouth daily. (Patient not taking: Reported on 11/22/2019)     No facility-administered medications prior to visit.    ROS: Review of Systems  Constitutional: Negative for appetite change, fatigue and unexpected weight change.  HENT: Negative for congestion, nosebleeds, sneezing, sore throat and trouble  swallowing.   Eyes: Negative for itching and visual disturbance.  Respiratory: Negative for cough.   Cardiovascular: Negative for chest pain, palpitations and leg swelling.  Gastrointestinal: Negative for abdominal distention, blood in stool, diarrhea and nausea.  Genitourinary: Negative for frequency and hematuria.  Musculoskeletal: Negative for back pain, gait problem, joint swelling and neck pain.  Skin: Negative for rash.  Neurological: Negative for dizziness, tremors, speech difficulty and weakness.  Psychiatric/Behavioral: Negative for agitation, dysphoric mood and sleep disturbance. The patient is not nervous/anxious.     Objective:  BP 100/62 (BP Location: Left Arm, Patient Position: Sitting, Cuff Size: Large)   Pulse (!) 57   Temp 98 F (36.7 C) (Oral)   Ht 5\' 11"  (1.803 m)   Wt 163 lb (73.9 kg)   SpO2 96%   BMI 22.73 kg/m   BP Readings from Last 3 Encounters:  11/22/19 100/62  08/21/19 118/82  08/20/19 125/72    Wt Readings from Last 3 Encounters:  11/22/19 163 lb (73.9 kg)  08/21/19 165 lb (74.8 kg)  08/20/19 170 lb (77.1 kg)    Physical Exam Constitutional:      General: He is not in acute distress.    Appearance: He is well-developed.     Comments: NAD  Eyes:  Conjunctiva/sclera: Conjunctivae normal.     Pupils: Pupils are equal, round, and reactive to light.  Neck:     Thyroid: No thyromegaly.     Vascular: No JVD.  Cardiovascular:     Rate and Rhythm: Normal rate and regular rhythm.     Heart sounds: Normal heart sounds. No murmur heard.  No friction rub. No gallop.   Pulmonary:     Effort: Pulmonary effort is normal. No respiratory distress.     Breath sounds: Normal breath sounds. No wheezing or rales.  Chest:     Chest wall: No tenderness.  Abdominal:     General: Bowel sounds are normal. There is no distension.     Palpations: Abdomen is soft. There is no mass.     Tenderness: There is no abdominal tenderness. There is no guarding or  rebound.  Genitourinary:    Prostate: Normal.     Rectum: Normal. Guaiac result negative.  Musculoskeletal:        General: No tenderness. Normal range of motion.     Cervical back: Normal range of motion.  Lymphadenopathy:     Cervical: No cervical adenopathy.  Skin:    General: Skin is warm and dry.     Findings: No rash.  Neurological:     Mental Status: He is alert and oriented to person, place, and time.     Cranial Nerves: No cranial nerve deficit.     Motor: No abnormal muscle tone.     Coordination: Coordination normal.     Gait: Gait normal.     Deep Tendon Reflexes: Reflexes are normal and symmetric.  Psychiatric:        Behavior: Behavior normal.        Thought Content: Thought content normal.        Judgment: Judgment normal.   Less than 1 cm superficial pressure sore on the left upper medial buttock and asymmetric 1 cm area of erythema with no skin breakdown yet.  Lab Results  Component Value Date   WBC 5.8 11/19/2019   HGB 14.5 11/19/2019   HCT 41.5 11/19/2019   PLT 224.0 11/19/2019   GLUCOSE 107 (H) 11/19/2019   CHOL 131 11/19/2019   TRIG 58.0 11/19/2019   HDL 58.40 11/19/2019   LDLDIRECT 117.3 01/21/2011   LDLCALC 61 11/19/2019   ALT 16 11/19/2019   AST 25 11/19/2019   NA 140 11/19/2019   K 4.5 11/19/2019   CL 102 11/19/2019   CREATININE 0.80 11/19/2019   BUN 10 11/19/2019   CO2 31 11/19/2019   TSH 2.71 11/19/2019   PSA 1.14 11/19/2019   HGBA1C 5.6 10/17/2017    DG Chest 2 View  Result Date: 10/04/2018 CLINICAL DATA:  Shortness of breath and cough. EXAM: CHEST - 2 VIEW COMPARISON:  None. FINDINGS: The heart size and mediastinal contours are within normal limits. Both lungs are clear. The visualized skeletal structures are unremarkable. IMPRESSION: No active cardiopulmonary disease. Electronically Signed   By: Lorriane Shire M.D.   On: 10/04/2018 15:00    Assessment & Plan:    Travis Kehr, MD

## 2019-11-22 NOTE — Assessment & Plan Note (Addendum)
L buttock Duoderm for buttock wound as dirrected Gel pad for chair Cut back on time online

## 2019-11-22 NOTE — Assessment & Plan Note (Signed)
ASA Crestor 

## 2019-11-22 NOTE — Patient Instructions (Addendum)
There are natural ways to boost your testosterone:  1. Lose Weight If you're overweight, shedding the excess pounds may increase your testosterone levels, according to multiple research. Overweight men are more likely to have low testosterone levels to begin with, so this is an important trick to increase your body's testosterone production when you need it most.   2. Strength Training    Strength training is also known to boost testosterone levels, provided you are doing so intensely enough. When strength training to boost testosterone, you'll want to increase the weight and lower your number of reps, and then focus on exercises that work a large number of muscles.  3. Optimize Your Vitamin D Levels Vitamin D, a steroid hormone, is essential for the healthy development of the nucleus of the sperm cell, and helps maintain semen quality and sperm count. Vitamin D also increases levels of testosterone, which may boost libido. In one study, overweight men who were given vitamin D supplements had a significant increase in testosterone levels after one year.  4. Reduce Stress When you're under a lot of stress, your body releases high levels of the stress hormone cortisol. This hormone actually blocks the effects of testosterone, presumably because, from a biological standpoint, testosterone-associated behaviors (mating, competing, aggression) may have lowered your chances of survival in an emergency (hence, the "fight or flight" response is dominant, courtesy of cortisol).  5. Limit or Eliminate Sugar from Your Diet Testosterone levels decrease after you eat sugar, which is likely because the sugar leads to a high insulin level, another factor leading to low testosterone.  6. Eat Healthy Fats By healthy, this means not only mon- and polyunsaturated fats, like that found in avocadoes and nuts, but also saturated, as these are essential for building testosterone. Research shows that a diet with less than  40 percent of energy as fat (and that mainly from animal sources, i.e. saturated) lead to a decrease in testosterone levels.  It's important to understand that your body requires saturated fats from animal and vegetable sources (such as meat, dairy, certain oils, and tropical plants like coconut) for optimal functioning, and if you neglect this important food group in favor of sugar, grains and other starchy carbs, your health and weight are almost guaranteed to suffer. Examples of healthy fats you can eat more of to give your testosterone levels a boost include:  Olives and Olive oil  Coconuts and coconut oil Butter made from organic milk  Raw nuts, such as, almonds or pecans Eggs Avocados   Meats Palm oil Unheated organic nut oils   7. "Testosterone boosters" containing Vitamin D-3, Niacin, Vitamin B-6, Vitamin B-12, Magnesium, Zinc, Selenium, D-Aspartic Acid, Fenugreed Seed Extract, Oystershell, [place] Extract, [place] Ginseng may be helpful as well.    Duoderm for buttock wound as dirrected Gel pad for chair

## 2019-11-22 NOTE — Assessment & Plan Note (Signed)
On Levothroid 

## 2019-11-22 NOTE — Assessment & Plan Note (Signed)
Problems sometimes.Try Travis Morse

## 2019-11-22 NOTE — Assessment & Plan Note (Signed)
We discussed age appropriate health related issues, including available/recomended screening tests and vaccinations. We discussed a need for adhering to healthy diet and exercise. Labs were ordered to be later reviewed . All questions were answered. Colon was due in 2016 A cardiac CT scan for coronary calcium score 660 - 2020  Coloduard 2018 (-)

## 2020-01-16 NOTE — Telephone Encounter (Signed)
Please advise 

## 2020-01-18 ENCOUNTER — Other Ambulatory Visit: Payer: Self-pay | Admitting: Internal Medicine

## 2020-01-18 DIAGNOSIS — N529 Male erectile dysfunction, unspecified: Secondary | ICD-10-CM

## 2020-01-23 DIAGNOSIS — L570 Actinic keratosis: Secondary | ICD-10-CM | POA: Diagnosis not present

## 2020-01-23 DIAGNOSIS — B353 Tinea pedis: Secondary | ICD-10-CM | POA: Diagnosis not present

## 2020-01-23 DIAGNOSIS — Z85828 Personal history of other malignant neoplasm of skin: Secondary | ICD-10-CM | POA: Diagnosis not present

## 2020-01-23 DIAGNOSIS — D225 Melanocytic nevi of trunk: Secondary | ICD-10-CM | POA: Diagnosis not present

## 2020-01-23 DIAGNOSIS — Z85820 Personal history of malignant melanoma of skin: Secondary | ICD-10-CM | POA: Diagnosis not present

## 2020-01-23 DIAGNOSIS — L821 Other seborrheic keratosis: Secondary | ICD-10-CM | POA: Diagnosis not present

## 2020-01-23 DIAGNOSIS — D1801 Hemangioma of skin and subcutaneous tissue: Secondary | ICD-10-CM | POA: Diagnosis not present

## 2020-02-11 ENCOUNTER — Telehealth: Payer: Self-pay | Admitting: Internal Medicine

## 2020-02-11 MED ORDER — LEVOTHYROXINE SODIUM 50 MCG PO TABS
50.0000 ug | ORAL_TABLET | Freq: Every day | ORAL | 2 refills | Status: DC
Start: 2020-02-11 — End: 2020-10-23

## 2020-02-11 NOTE — Telephone Encounter (Signed)
1.Medication Requested: levothyroxine (SYNTHROID) 50 MCG tablet 2. Pharmacy (Name, Street, [place]): [place], [place] Phone:  [phone]  Fax:  [phone]     3. On Med List: yes 4. Last Visit with PCP: 09.16.21 5. Next visit date with PCP: 03.21.22  Patient states he needs a new prescription written to get a refill for this medication, and would like it to now go to the [facility] in [place].

## 2020-02-11 NOTE — Telephone Encounter (Signed)
Reviewed chart pt is up-to-date sent refills to [facility].../lmb  

## 2020-05-22 DIAGNOSIS — N5201 Erectile dysfunction due to arterial insufficiency: Secondary | ICD-10-CM | POA: Diagnosis not present

## 2020-05-23 ENCOUNTER — Other Ambulatory Visit: Payer: Self-pay

## 2020-05-26 ENCOUNTER — Other Ambulatory Visit: Payer: Self-pay

## 2020-05-26 ENCOUNTER — Encounter: Payer: Self-pay | Admitting: Internal Medicine

## 2020-05-26 ENCOUNTER — Ambulatory Visit: Payer: Medicare PPO | Admitting: Internal Medicine

## 2020-05-26 VITALS — BP 110/78 | HR 53 | Temp 98.2°F | Ht 70.0 in | Wt 152.8 lb

## 2020-05-26 DIAGNOSIS — I2510 Atherosclerotic heart disease of native coronary artery without angina pectoris: Secondary | ICD-10-CM | POA: Diagnosis not present

## 2020-05-26 DIAGNOSIS — R931 Abnormal findings on diagnostic imaging of heart and coronary circulation: Secondary | ICD-10-CM

## 2020-05-26 DIAGNOSIS — N529 Male erectile dysfunction, unspecified: Secondary | ICD-10-CM

## 2020-05-26 DIAGNOSIS — E034 Atrophy of thyroid (acquired): Secondary | ICD-10-CM | POA: Diagnosis not present

## 2020-05-26 DIAGNOSIS — R0989 Other specified symptoms and signs involving the circulatory and respiratory systems: Secondary | ICD-10-CM

## 2020-05-26 DIAGNOSIS — E785 Hyperlipidemia, unspecified: Secondary | ICD-10-CM | POA: Diagnosis not present

## 2020-05-26 DIAGNOSIS — I2583 Coronary atherosclerosis due to lipid rich plaque: Secondary | ICD-10-CM | POA: Diagnosis not present

## 2020-05-26 DIAGNOSIS — R413 Other amnesia: Secondary | ICD-10-CM

## 2020-05-26 LAB — COMPREHENSIVE METABOLIC PANEL
ALT: 19 U/L (ref 0–53)
AST: 27 U/L (ref 0–37)
Albumin: 4.4 g/dL (ref 3.5–5.2)
Alkaline Phosphatase: 50 U/L (ref 39–117)
BUN: 11 mg/dL (ref 6–23)
CO2: 31 mEq/L (ref 19–32)
Calcium: 9.6 mg/dL (ref 8.4–10.5)
Chloride: 101 mEq/L (ref 96–112)
Creatinine, Ser: 0.75 mg/dL (ref 0.40–1.50)
GFR: 91.51 mL/min (ref >60.00)
Glucose, Bld: 106 mg/dL — ABNORMAL HIGH (ref 70–99)
Potassium: 4.2 mEq/L (ref 3.5–5.1)
Sodium: 138 mEq/L (ref 135–145)
Total Bilirubin: 0.4 mg/dL (ref 0.2–1.2)
Total Protein: 7.5 g/dL (ref 6.0–8.3)

## 2020-05-26 LAB — LIPID PANEL
Cholesterol: 156 mg/dL (ref 0–200)
HDL: 74.7 mg/dL (ref >39.00)
LDL Cholesterol: 73 mg/dL (ref 0–99)
NonHDL: 81.65
Total CHOL/HDL Ratio: 2
Triglycerides: 43 mg/dL (ref 0.0–149.0)
VLDL: 8.6 mg/dL (ref 0.0–40.0)

## 2020-05-26 MED ORDER — TRIAMCINOLONE ACETONIDE 0.5 % EX OINT: 1.0000 "application " | TOPICAL_OINTMENT | Freq: Two times a day (BID) | CUTANEOUS | 1 refills | Status: AC

## 2020-05-26 NOTE — Assessment & Plan Note (Signed)
Levothroid 

## 2020-05-26 NOTE — Assessment & Plan Note (Signed)
Mild Ordered Car Duplex US

## 2020-05-26 NOTE — Addendum Note (Signed)
Addended by: Boris Lown B on: 05/26/2020 02:52 PM   Modules accepted: Orders

## 2020-05-26 NOTE — Progress Notes (Signed)
Subjective:  Patient ID: Travis Morse, male    DOB: Jun 24, 1950  Age: 70 y.o. MRN: [id number]  CC: Follow-up (6 months)   HPI Travis Morse presents for CAD, hypothyroidism, ED f/u  Outpatient Medications Prior to Visit  Medication Sig Dispense Refill  . Avanafil (STENDRA) 100 MG TABS Take 100 mg by mouth daily as needed. 20 tablet 5  . b complex vitamins tablet Take 1 tablet by mouth daily.    . Beet Root POWD Take by mouth 2 (two) times daily. Mixes 1 tbsp in 6 oz of water    . Cholecalciferol (VITAMIN D) 2000 units tablet Take 2,000 Units by mouth daily.    . Coenzyme Q10 (CO Q 10 PO) Take by mouth daily.    . fish oil-omega-3 fatty acids 1000 MG capsule Take 4,000 mg by mouth 2 (two) times daily.    Marland Kitchen GLUCOSAMINE SULFATE PO Take 2 tablets by mouth daily.    Marland Kitchen levothyroxine (SYNTHROID) 50 MCG tablet Take 1 tablet (50 mcg total) by mouth daily. 90 tablet 2  . Multiple Vitamin (MULTIVITAMIN WITH MINERALS) TABS tablet Take 1 tablet by mouth daily.    . pantoprazole (PROTONIX) 40 MG tablet Take 1 tablet (40 mg total) by mouth daily. 30 tablet 6  . rosuvastatin (CRESTOR) 10 MG tablet Take 10 mg by mouth every other day.     . TURMERIC PO Take 1 tablet by mouth 2 (two) times daily. Less than a teaspoonful -takes with tomato paste and pepper    . ketoconazole (NIZORAL) 2 % cream  (Patient not taking: Reported on 05/26/2020)    . tadalafil (CIALIS) 5 MG tablet Take 1-2 tablets (5-10 mg total) by mouth daily as needed for erectile dysfunction. (Patient not taking: Reported on 05/26/2020) 90 tablet 11   No facility-administered medications prior to visit.    ROS: Review of Systems  Constitutional: Negative for appetite change, fatigue and unexpected weight change.  HENT: Negative for congestion, nosebleeds, sneezing, sore throat and trouble swallowing.   Eyes: Negative for itching and visual disturbance.  Respiratory: Negative for cough.   Cardiovascular: Negative for chest pain,  palpitations and leg swelling.  Gastrointestinal: Negative for abdominal distention, blood in stool, diarrhea and nausea.  Genitourinary: Negative for frequency and hematuria.  Musculoskeletal: Negative for back pain, gait problem, joint swelling and neck pain.  Skin: Negative for rash.  Neurological: Negative for dizziness, tremors, speech difficulty and weakness.  Psychiatric/Behavioral: Negative for agitation, dysphoric mood and sleep disturbance. The patient is not nervous/anxious.     Objective:  BP 110/78 (BP Location: Left Arm)   Pulse (!) 53   Temp 98.2 F (36.8 C) (Oral)   Ht 5\' 10"  (1.778 m)   Wt 152 lb 12.8 oz (69.3 kg)   SpO2 98%   BMI 21.92 kg/m   BP Readings from Last 3 Encounters:  05/26/20 110/78  11/22/19 100/62  08/21/19 118/82    Wt Readings from Last 3 Encounters:  05/26/20 152 lb 12.8 oz (69.3 kg)  11/22/19 163 lb (73.9 kg)  08/21/19 165 lb (74.8 kg)    Physical Exam Constitutional:      General: He is not in acute distress.    Appearance: He is well-developed.     Comments: NAD  Eyes:     Conjunctiva/sclera: Conjunctivae normal.     Pupils: Pupils are equal, round, and reactive to light.  Neck:     Thyroid: No thyromegaly.     Vascular: No JVD.  Cardiovascular:     Rate and Rhythm: Normal rate and regular rhythm.     Heart sounds: Normal heart sounds. No murmur heard. No friction rub. No gallop.   Pulmonary:     Effort: Pulmonary effort is normal. No respiratory distress.     Breath sounds: Normal breath sounds. No wheezing or rales.  Chest:     Chest wall: No tenderness.  Abdominal:     General: Bowel sounds are normal. There is no distension.     Palpations: Abdomen is soft. There is no mass.     Tenderness: There is no abdominal tenderness. There is no guarding or rebound.  Musculoskeletal:        General: No tenderness. Normal range of motion.     Cervical back: Normal range of motion.  Lymphadenopathy:     Cervical: No cervical  adenopathy.  Skin:    General: Skin is warm and dry.     Findings: No rash.  Neurological:     Mental Status: He is alert and oriented to person, place, and time.     Cranial Nerves: No cranial nerve deficit.     Motor: No abnormal muscle tone.     Coordination: Coordination normal.     Gait: Gait normal.     Deep Tendon Reflexes: Reflexes are normal and symmetric.  Psychiatric:        Behavior: Behavior normal.        Thought Content: Thought content normal.        Judgment: Judgment normal.   B mild bruit   Lab Results  Component Value Date   WBC 5.8 11/19/2019   HGB 14.5 11/19/2019   HCT 41.5 11/19/2019   PLT 224.0 11/19/2019   GLUCOSE 107 (H) 11/19/2019   CHOL 131 11/19/2019   TRIG 58.0 11/19/2019   HDL 58.40 11/19/2019   LDLDIRECT 117.3 01/21/2011   LDLCALC 61 11/19/2019   ALT 16 11/19/2019   AST 25 11/19/2019   NA 140 11/19/2019   K 4.5 11/19/2019   CL 102 11/19/2019   CREATININE 0.80 11/19/2019   BUN 10 11/19/2019   CO2 31 11/19/2019   TSH 2.71 11/19/2019   PSA 1.14 11/19/2019   HGBA1C 5.6 10/17/2017    DG Chest 2 View  Result Date: 10/04/2018 CLINICAL DATA:  Shortness of breath and cough. EXAM: CHEST - 2 VIEW COMPARISON:  None. FINDINGS: The heart size and mediastinal contours are within normal limits. Both lungs are clear. The visualized skeletal structures are unremarkable. IMPRESSION: No active cardiopulmonary disease. Electronically Signed   By: Lorriane Shire M.D.   On: 10/04/2018 15:00    Assessment & Plan:    Walker Kehr, MD

## 2020-05-26 NOTE — Assessment & Plan Note (Signed)
F/u w/Dr Gloriann Loan - Urology

## 2020-05-26 NOTE — Assessment & Plan Note (Signed)
Doing well w/memory

## 2020-05-26 NOTE — Assessment & Plan Note (Signed)
Cont w/Crestor  ASA

## 2020-05-26 NOTE — Assessment & Plan Note (Signed)
ASA Crestor

## 2020-05-27 NOTE — Addendum Note (Signed)
Addended by: Earnstine Regal on: 05/27/2020 12:16 PM   Modules accepted: Orders

## 2020-06-04 ENCOUNTER — Other Ambulatory Visit: Payer: Self-pay

## 2020-06-04 ENCOUNTER — Ambulatory Visit (HOSPITAL_COMMUNITY)
Admission: RE | Admit: 2020-06-04 | Discharge: 2020-06-04 | Disposition: A | Discharge status: Home / Self Care | Payer: Medicare PPO | Source: Ambulatory Visit | Attending: Cardiovascular Disease | Admitting: Cardiovascular Disease

## 2020-06-04 ENCOUNTER — Encounter (HOSPITAL_COMMUNITY): Payer: Medicare PPO

## 2020-06-04 DIAGNOSIS — R0989 Other specified symptoms and signs involving the circulatory and respiratory systems: Secondary | ICD-10-CM | POA: Insufficient documentation

## 2020-07-14 IMAGING — CT CT HEAD WITHOUT CONTRAST
3 series · 15 of 46 positions shown, 18 images · non-contrast
Comparison: None.

CLINICAL DATA: Chronic headache

EXAM:
CT HEAD WITHOUT CONTRAST
TECHNIQUE: Contiguous axial images were obtained from the base of the skull
through the vertex without intravenous contrast.

[Series 2: head 5.0 h37s · axial · 0.41mm/px · z∈[+147,+267]mm · 9 of 29 slices shown, 12 images]
[im 3/29  brain]
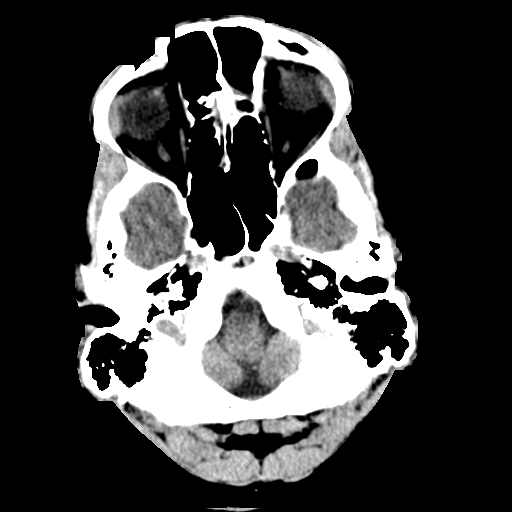
[im 3/29  bone]
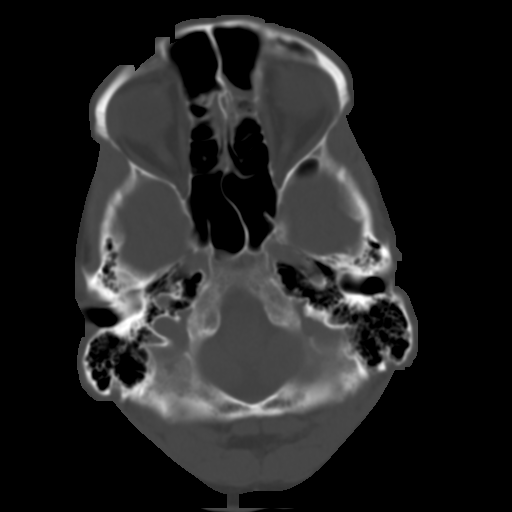
[im 6/29  brain]
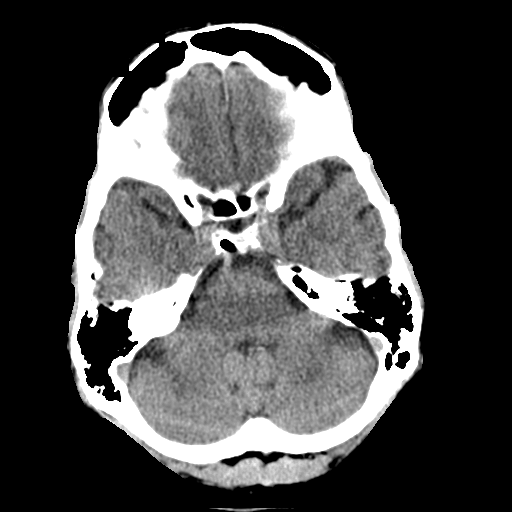
[im 9/29  brain]
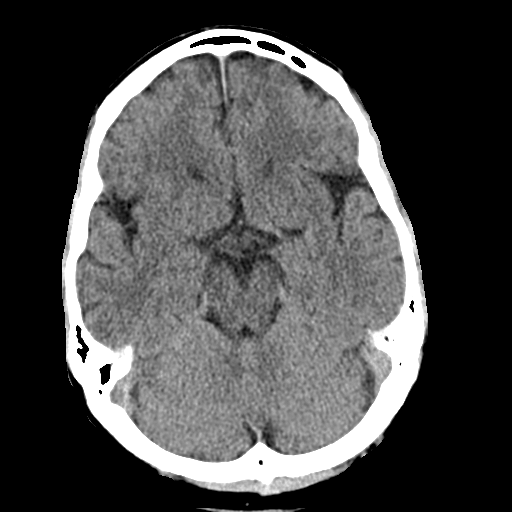
[im 12/29  brain]
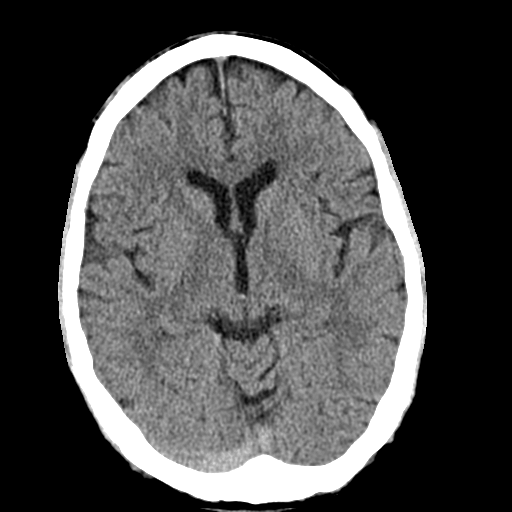
[im 15/29  brain]
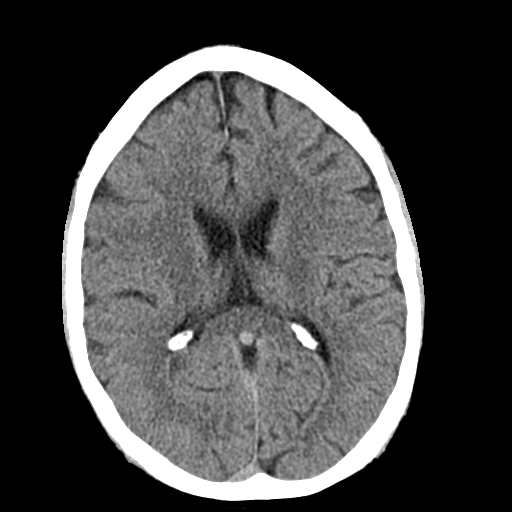
[im 15/29  bone]
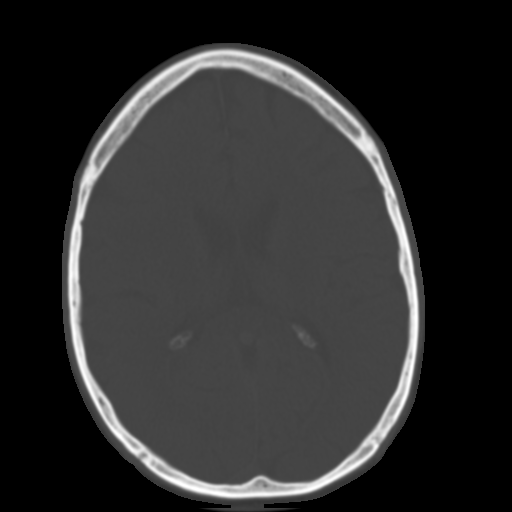
[im 18/29  brain]
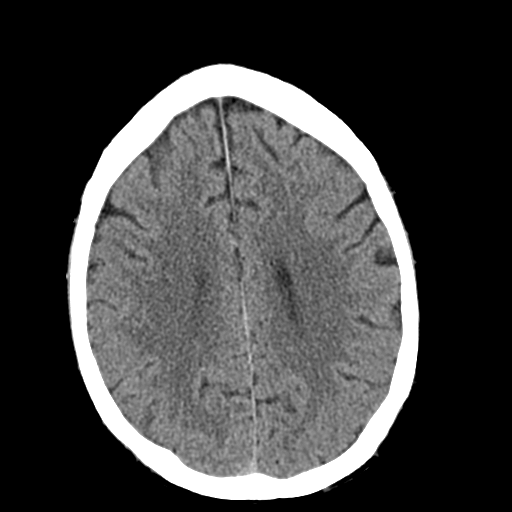
[im 21/29  brain]
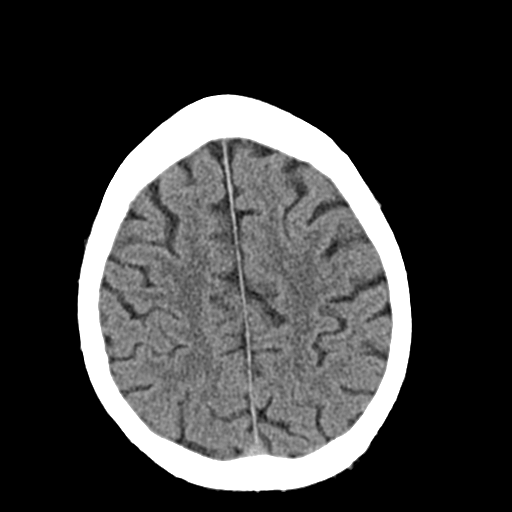
[im 24/29  brain]
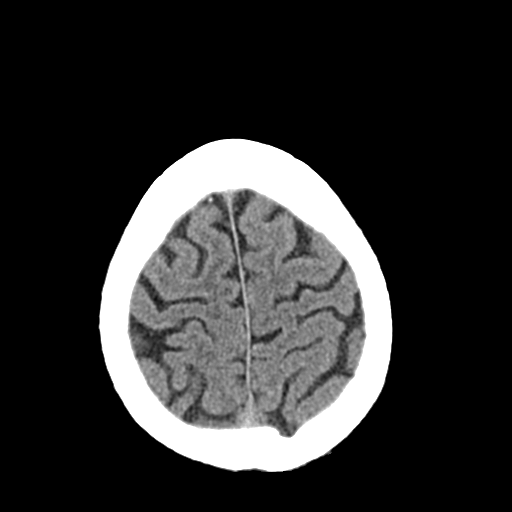
[im 27/29  brain]
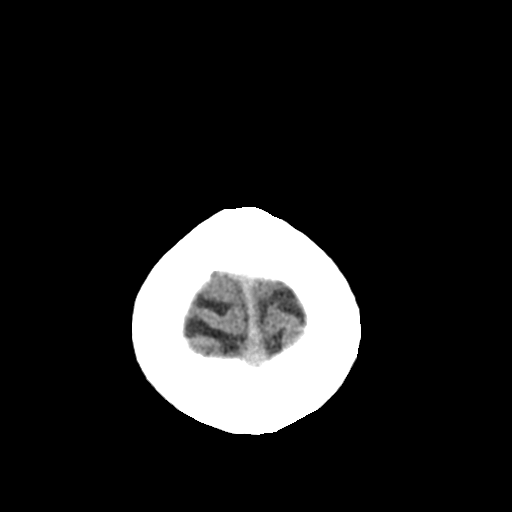
[im 27/29  bone]
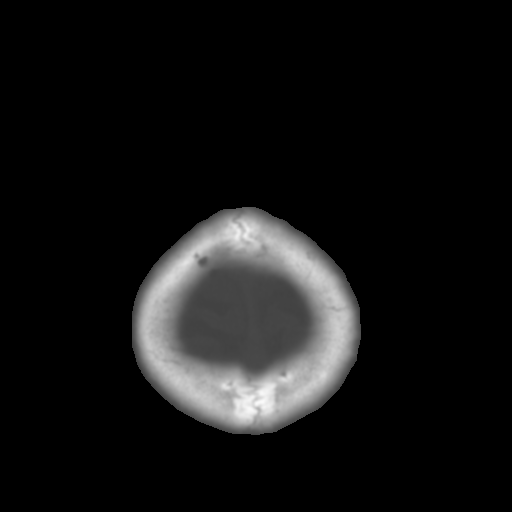

[Series 4: head 3.0 mpr cor · coronal · 0.29mm/px · 3 of 70 slices shown]
[im 24/70  brain]
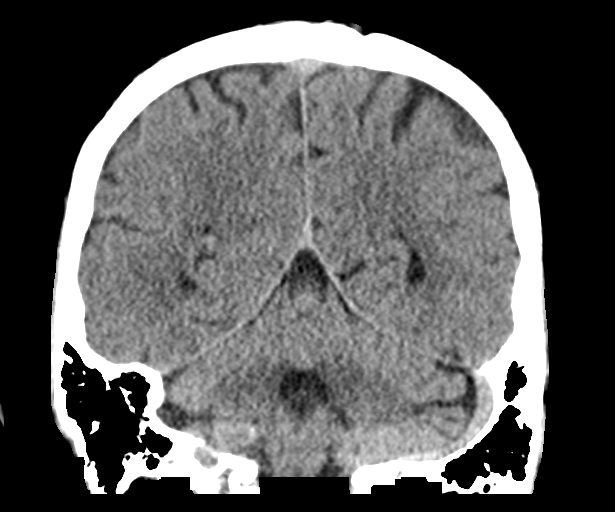
[im 31/70  brain]
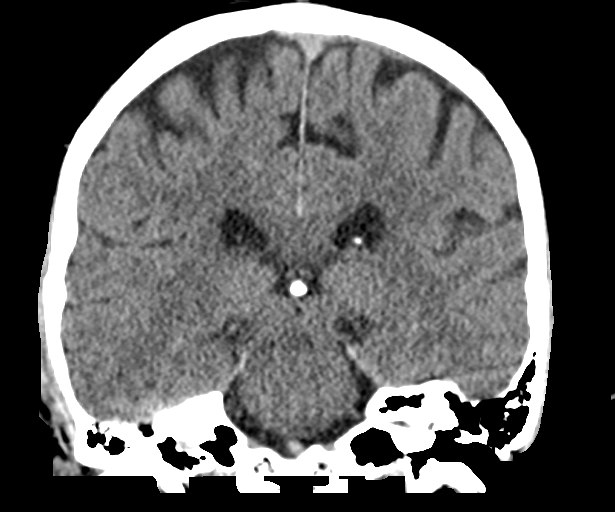
[im 39/70  brain]
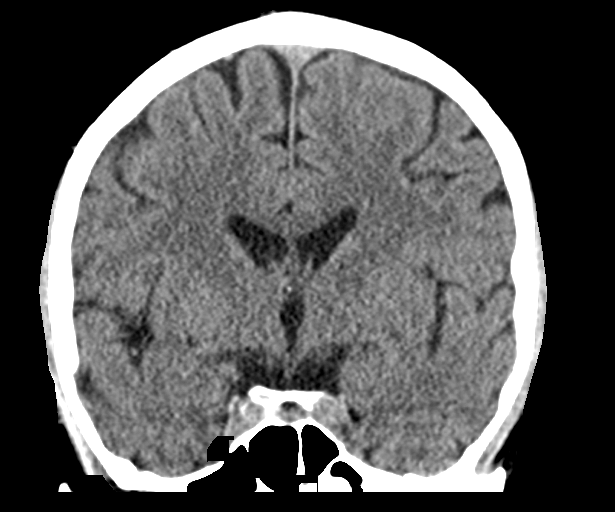

[Series 5: head 3.0 mpr sag · sagittal · 0.29mm/px · 3 of 56 slices shown]
[im 19/56  brain]
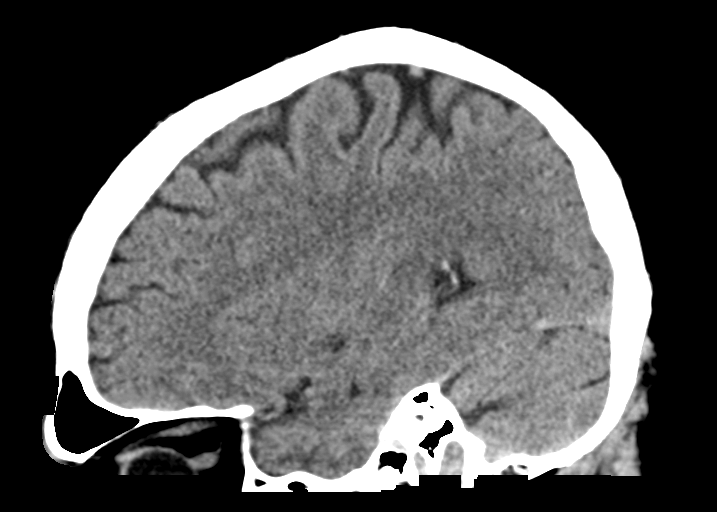
[im 28/56  brain]
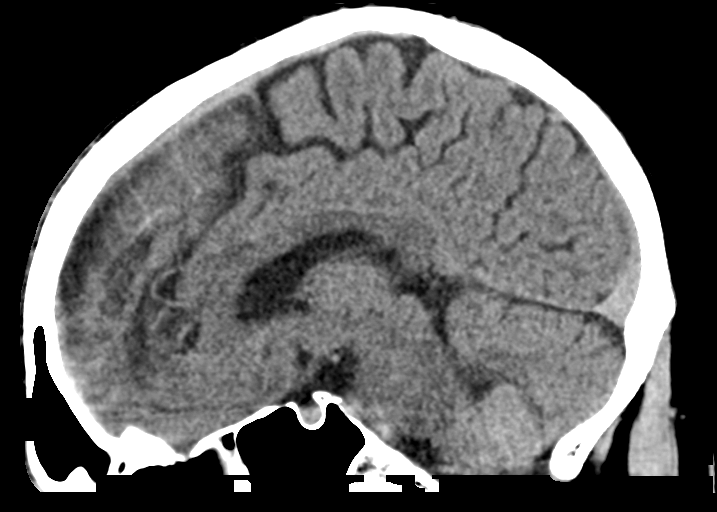
[im 37/56  brain]
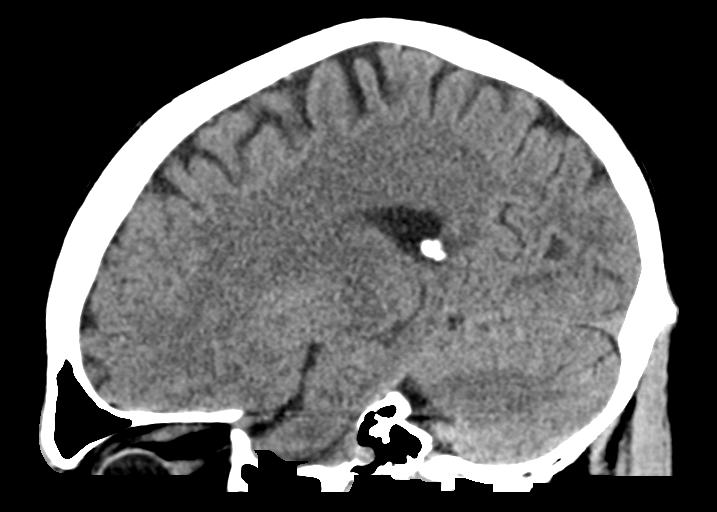

[15 of 46 positions shown; findings below may reference images not displayed]

FINDINGS: Brain: Ventricles are normal in size and configuration. There is no
intracranial mass, hemorrhage, extra-axial fluid collection, or
midline shift. The brain parenchyma appears unremarkable. No evident
acute infarct.

Vascular: No hyperdense vessel. There is no appreciable vascular
calcification.

Skull: The bony calvarium appears intact.

Sinuses/Orbits: There is opacification in multiple ethmoid air
cells. There is mild mucosal thickening in left sphenoid sinuses.
Visualized orbits appear symmetric bilaterally.

Other: Visualized mastoid air cells are clear.
IMPRESSION: Foci of paranasal sinus disease.  Study otherwise unremarkable.

## 2020-08-13 DIAGNOSIS — Z85828 Personal history of other malignant neoplasm of skin: Secondary | ICD-10-CM | POA: Diagnosis not present

## 2020-08-13 DIAGNOSIS — L570 Actinic keratosis: Secondary | ICD-10-CM | POA: Diagnosis not present

## 2020-08-13 DIAGNOSIS — L821 Other seborrheic keratosis: Secondary | ICD-10-CM | POA: Diagnosis not present

## 2020-08-13 DIAGNOSIS — L812 Freckles: Secondary | ICD-10-CM | POA: Diagnosis not present

## 2020-08-13 DIAGNOSIS — Z85820 Personal history of malignant melanoma of skin: Secondary | ICD-10-CM | POA: Diagnosis not present

## 2020-10-07 ENCOUNTER — Telehealth: Payer: Self-pay | Admitting: Internal Medicine

## 2020-10-07 NOTE — Telephone Encounter (Signed)
For pt to rtn my call to schedule AWV with [facility]. Please schedule this appt if pt calls the office.

## 2020-10-23 ENCOUNTER — Ambulatory Visit (INDEPENDENT_AMBULATORY_CARE_PROVIDER_SITE_OTHER): Payer: Medicare PPO

## 2020-10-23 ENCOUNTER — Other Ambulatory Visit: Payer: Self-pay | Admitting: *Deleted

## 2020-10-23 ENCOUNTER — Other Ambulatory Visit: Payer: Self-pay

## 2020-10-23 VITALS — BP 118/60 | Temp 97.0°F | Ht 70.0 in | Wt 152.0 lb

## 2020-10-23 DIAGNOSIS — Z0000 Encounter for general adult medical examination without abnormal findings: Secondary | ICD-10-CM | POA: Diagnosis not present

## 2020-10-23 MED ORDER — LEVOTHYROXINE SODIUM 50 MCG PO TABS: 50.0000 ug | ORAL_TABLET | Freq: Every day | ORAL | 1 refills | Status: DC

## 2020-10-23 NOTE — Progress Notes (Addendum)
Subjective:   Travis Morse is a 70 y.o. male who presents for Medicare Annual/Subsequent preventive examination.  Review of Systems     Cardiac Risk Factors include: advanced age (>23mn, >>49women);dyslipidemia;family history of premature cardiovascular disease;male gender     Objective:    Today's Vitals   10/23/20 1407  BP: 118/60  Temp: (!) 97 F (36.1 C)  TempSrc: Temporal  SpO2: 97%  Weight: 152 lb (68.9 kg)  Height: '5\' 10"'  (1.778 m)  PainSc: 0-No pain   Body mass index is 21.81 kg/m.  Advanced Directives 10/23/2020 10/23/2016 09/14/2013  Does Patient Have a Medical Advance Directive? Yes Yes Patient has advance directive, copy not in chart  Type of Advance Directive Living will;Healthcare Power of AJessieLiving will HGannLiving will;Advance instruction for mental health treatment  Does patient want to make changes to medical advance directive? No - Patient declined No - Patient declined -  Copy of HThe [place] Chart? No - copy requested Yes -    Current Medications (verified) Outpatient Encounter Medications as of 10/23/2020  Medication Sig   Avanafil (STENDRA) 100 MG TABS Take 100 mg by mouth daily as needed.   b complex vitamins tablet Take 1 tablet by mouth daily.   Beet Root POWD Take by mouth 2 (two) times daily. Mixes 1 tbsp in 6 oz of water   Cholecalciferol (VITAMIN D) 2000 units tablet Take 2,000 Units by mouth daily.   Coenzyme Q10 (CO Q 10 PO) Take by mouth daily.   fish oil-omega-3 fatty acids 1000 MG capsule Take 4,000 mg by mouth 2 (two) times daily.   GLUCOSAMINE SULFATE PO Take 2 tablets by mouth daily.   levothyroxine (SYNTHROID) 50 MCG tablet Take 1 tablet (50 mcg total) by mouth daily.   Multiple Vitamin (MULTIVITAMIN WITH MINERALS) TABS tablet Take 1 tablet by mouth daily.   pantoprazole (PROTONIX) 40 MG tablet Take 1 tablet (40 mg total) by mouth daily.   rosuvastatin  (CRESTOR) 10 MG tablet Take 10 mg by mouth every other day.    triamcinolone ointment (KENALOG) 0.5 % Apply 1 application topically 2 (two) times daily. Skin cracks   TURMERIC PO Take 1 tablet by mouth 2 (two) times daily. Less than a teaspoonful -takes with tomato paste and pepper   No facility-administered encounter medications on file as of 10/23/2020.    Allergies (verified) Patient has no active allergies.   History: Past Medical History:  Diagnosis Date   Allergy    allergic to bee stings   Anemia    Anxiety    [place]   Depression    History of kidney stones    Hx of colonic polyps    Dr PHenrene Pastor  Hypothyroidism 2008   Insomnia    Memory difficulty    Multiple thyroid nodules    Past Surgical History:  Procedure Laterality Date   Arm fracture     as a child   COLONOSCOPY     COLONOSCOPY     OPEN REDUCTION INTERNAL FIXATION (ORIF) METACARPAL Right 10/23/2016   Procedure: Right thumb closed reduction and pinning;  Surgeon: OIran Planas MD;  [facility]  Service: Orthopedics;  Laterality: Right;   POLYPECTOMY     THYROIDECTOMY, PARTIAL     TIBIA FRACTURE SURGERY     Family History  Problem Relation Age of Onset   Coronary artery disease Father  93's   Heart disease Father 33       MI   Thyroid disease Neg Hx    Colon polyps Neg Hx    Colon cancer Neg Hx    Esophageal cancer Neg Hx    Rectal cancer Neg Hx    Stomach cancer Neg Hx    Social History   Socioeconomic History   Marital status: Single    Spouse name: Not on file   Number of children: Not on file   Years of education: Not on file   Highest education level: Not on file  Occupational History   Not on file  Tobacco Use   Smoking status: Never   Smokeless tobacco: Never  Vaping Use   Vaping Use: Never used  Substance and Sexual Activity   Alcohol use: Yes    Comment: 1 glass a wine a day    Drug use: No   Sexual activity: Yes    Partners: Female  Other Topics  Concern   Not on file  Social History Narrative   Not on file   Social Determinants of Health   Financial Resource Strain: Low Risk    Difficulty of Paying Living Expenses: Not hard at all  Food Insecurity: No Food Insecurity   Worried About Charity fundraiser in the Last Year: Never true   Ran Out of Food in the Last Year: Never true  Transportation Needs: No Transportation Needs   Lack of Transportation (Medical): No   Lack of Transportation (Non-Medical): No  Physical Activity: Sufficiently Active   Days of Exercise per Week: 5 days   Minutes of Exercise per Session: 30 min  Stress: No Stress Concern Present   Feeling of Stress : Not at all  Social Connections: Socially Integrated   Frequency of Communication with Friends and Family: More than three times a week   Frequency of Social Gatherings with Friends and Family: Twice a week   Attends Religious Services: 1 to 4 times per year   Active Member of [employer] or Organizations: Yes   Attends Archivist Meetings: 1 to 4 times per year   Marital Status: Living with partner    Tobacco Counseling Counseling given: Not Answered   Clinical Intake:  Pre-visit preparation completed: Yes  Pain : No/denies pain Pain Score: 0-No pain     BMI - recorded: 21.81 Nutritional Status: BMI of 19-24  Normal Nutritional Risks: None Diabetes: No  How often do you need to have someone help you when you read instructions, pamphlets, or other written materials from your doctor or pharmacy?: 1 - Never What is the last grade level you completed in school?: Graduate Degree  Diabetic? no  Interpreter Needed?: No  Information entered by :: Lisette Abu, LPN   Activities of Daily Living In your present state of health, do you have any difficulty performing the following activities: 10/23/2020  Hearing? N  Vision? N  Difficulty concentrating or making decisions? N  Walking or climbing stairs? N  Dressing or bathing? N   Doing errands, shopping? N  Preparing Food and eating ? N  Using the Toilet? N  In the past six months, have you accidently leaked urine? N  Do you have problems with loss of bowel control? N  Managing your Medications? N  Managing your Finances? N  Housekeeping or managing your Housekeeping? N  Some recent data might be hidden    Patient Care Team: Plotnikov, Evie Lacks, MD as PCP -  Freddy Jaksch, MD as Consulting Physician (Gastroenterology) Redmond Baseman, MD as Referring Physician (Cardiology) Lucas Mallow, MD as Consulting Physician (Urology) Laurence Aly, OD as Consulting Physician (Optometry)  Indicate any recent Medical Services you may have received from other than [facility] providers in the past year (date may be approximate).     Assessment:   This is a routine wellness examination for Aiyden.  Hearing/Vision screen Hearing Screening - Comments:: Patient denied any hearing difficulty. Vision Screening - Comments:: Patient wears glasses.  Eye exam done annually by [facility].  Dietary issues and exercise activities discussed: Current Exercise Habits: Home exercise routine, Type of exercise: walking;Other - see comments (snowboarding), Time (Minutes): 30, Frequency (Times/Week): 5, Weekly Exercise (Minutes/Week): 150, Intensity: Moderate, Exercise limited by: None identified   Goals Addressed               This Visit's Progress     Patient Stated (pt-stated)        My goal is to live a very active life by staying independent and physically active.      Depression Screen PHQ 2/9 Scores 10/23/2020 10/17/2017 10/14/2016 08/20/2015  PHQ - 2 Score 0 0 0 1    Fall Risk Fall Risk  10/23/2020 11/22/2019 10/17/2017 10/14/2016 08/20/2015  Falls in the past year? 0 0 No No No  Number falls in past yr: 0 - - - -  Injury with Fall? 0 0 - - -  Risk for fall due to : No Fall Risks - - - -  Follow up Falls evaluation completed - - - -    FALL RISK  PREVENTION PERTAINING TO THE HOME:  Any stairs in or around the home? Yes  If so, are there any without handrails? No  Home free of loose throw rugs in walkways, pet beds, electrical cords, etc? Yes  Adequate lighting in your home to reduce risk of falls? Yes   ASSISTIVE DEVICES UTILIZED TO PREVENT FALLS:  Life alert? No  Use of a cane, walker or w/c? No  Grab bars in the bathroom? No  Shower chair or bench in shower? No  Elevated toilet seat or a handicapped toilet? No   TIMED UP AND GO:  Was the test performed? Yes .  Length of time to ambulate 10 feet: 5 sec.   Gait steady and fast without use of assistive device  Cognitive Function: Normal cognitive status assessed by direct observation by this Nurse Health Advisor. No abnormalities found.          Immunizations Immunization History  Administered Date(s) Administered   Fluad Quad(high Dose 65+) 12/07/2019   Influenza Split 01/26/2011, 01/24/2012   Influenza Whole 12/19/2004, 12/28/2006   Influenza, High Dose Seasonal PF 02/20/2016, 01/10/2018, 01/17/2019   Influenza,inj,Quad PF,6+ Mos 01/17/2013, 12/14/2013, 02/19/2015   MMR 09/05/2017   PFIZER(Purple Top)SARS-COV-2 Vaccination 03/28/2019, 04/23/2019, 12/01/2019, 07/26/2020   Pneumococcal Conjugate-13 09/19/2015   Pneumococcal Polysaccharide-23 02/19/2015   Td 06/27/2007   Tdap 08/05/2017   Typhoid Live 09/06/2017   Zoster Recombinat (Shingrix) 01/10/2018, 04/27/2018   Zoster, Live 01/24/2012    TDAP status: Up to date  Flu Vaccine status: Up to date  Pneumococcal vaccine status: Up to date  Covid-19 vaccine status: Completed vaccines  Qualifies for Shingles Vaccine? Yes   Zostavax completed Yes   Shingrix Completed?: Yes  Screening Tests Health Maintenance  Topic Date Due   INFLUENZA VACCINE  10/06/2020   COVID-19 Vaccine (5 - Booster for [employer]  series) 11/26/2020   COLONOSCOPY (Pts 45-56yr Insurance coverage will need to be confirmed)  08/19/2024    TETANUS/TDAP  08/06/2027   Hepatitis C Screening  Completed   PNA vac Low Risk Adult  Completed   Zoster Vaccines- Shingrix  Completed   HPV VACCINES  Aged Out    Health Maintenance  Health Maintenance Due  Topic Date Due   INFLUENZA VACCINE  10/06/2020    Colorectal cancer screening: Type of screening: Colonoscopy. Completed 08/20/2019. Repeat every 5 years  Lung Cancer Screening: (Low Dose CT Chest recommended if Age 71-80years, 30 pack-year currently smoking OR have quit w/in 15years.) does not qualify.   Lung Cancer Screening Referral: no  Additional Screening:  Hepatitis C Screening: does qualify; Completed yes  Vision Screening: Recommended annual ophthalmology exams for early detection of glaucoma and other disorders of the eye. Is the patient up to date with their annual eye exam?  Yes  Who is the provider or what is the name of the [facility] which the patient [email or web address] If pt is not established with a provider, would they like to be referred to a provider to establish care? No .   Dental Screening: Recommended annual dental exams for proper oral hygiene  Community Resource Referral / Chronic Care Management: CRR required this visit?  No   CCM required this visit?  No      Plan:     I have personally reviewed and noted the following in the patient's chart:   Medical and social history Use of alcohol, tobacco or illicit drugs  Current medications and supplements including opioid prescriptions. Patient is not currently taking opioid prescriptions. Functional ability and status Nutritional status Physical activity Advanced directives List of other physicians Hospitalizations, surgeries, and ER visits in previous 12 months Vitals Screenings to include cognitive, depression, and falls Referrals and appointments  In addition, I have reviewed and discussed with patient certain preventive  protocols, quality metrics, and best practice recommendations. A written personalized care plan for preventive services as well as general preventive health recommendations were provided to patient.     SSheral Flow LPN   [id number]  Nurse Notes: n/a  Medical screening examination/treatment/procedure(s) were performed by non-physician practitioner and as supervising physician I was immediately available for consultation/collaboration.  I agree with above. ALew Dawes MD

## 2020-10-23 NOTE — Patient Instructions (Signed)
Mr. Castiglioni , Thank you for taking time to come for your Medicare Wellness Visit. I appreciate your ongoing commitment to your health goals. Please review the following plan we discussed and let me know if I can assist you in the future.   Screening recommendations/referrals: Colonoscopy: last done 08/20/2019; due every 5 years Recommended yearly ophthalmology/optometry visit for glaucoma screening and checkup Recommended yearly dental visit for hygiene and checkup  Vaccinations: Influenza vaccine: due Fall 2022 Pneumococcal vaccine: 02/19/2015, 09/19/2015 Tdap vaccine: 08/05/2017; due every 10 years Shingles vaccine: 01/10/2018, 04/27/2018   Covid-19: 03/28/2019, 04/23/2019, 12/01/2019, 07/26/2020  Advanced directives: Please bring a copy of your health care power of attorney and living will to the office at your convenience.  Conditions/risks identified: Yes; Client understands the importance of follow-up with providers by attending scheduled visits and discussed goals to eat healthier, increase physical activity, exercise the brain, socialize more, get enough sleep and make time for laughter.  Next appointment: Please schedule your next Medicare Wellness Visit with your Nurse Health Advisor in 1 year by calling [phone].  Preventive Care 15 Years and Older, Male Preventive care refers to lifestyle choices and visits with your health care provider that can promote health and wellness. What does preventive care include? A yearly physical exam. This is also called an annual well check. Dental exams once or twice a year. Routine eye exams. Ask your health care provider how often you should have your eyes checked. Personal lifestyle choices, including: Daily care of your teeth and gums. Regular physical activity. Eating a healthy diet. Avoiding tobacco and drug use. Limiting alcohol use. Practicing safe sex. Taking low doses of aspirin every day. Taking vitamin and mineral supplements as  recommended by your health care provider. What happens during an annual well check? The services and screenings done by your health care provider during your annual well check will depend on your age, overall health, lifestyle risk factors, and family history of disease. Counseling  Your health care provider may ask you questions about your: Alcohol use. Tobacco use. Drug use. Emotional well-being. Home and relationship well-being. Sexual activity. Eating habits. History of falls. Memory and ability to understand (cognition). Work and work Statistician. Screening  You may have the following tests or measurements: Height, weight, and BMI. Blood pressure. Lipid and cholesterol levels. These may be checked every 5 years, or more frequently if you are over 55 years old. Skin check. Lung cancer screening. You may have this screening every year starting at age 45 if you have a 30-pack-year history of smoking and currently smoke or have quit within the past 15 years. Fecal occult blood test (FOBT) of the stool. You may have this test every year starting at age 46. Flexible sigmoidoscopy or colonoscopy. You may have a sigmoidoscopy every 5 years or a colonoscopy every 10 years starting at age 68. Prostate cancer screening. Recommendations will vary depending on your family history and other risks. Hepatitis C blood test. Hepatitis B blood test. Sexually transmitted disease (STD) testing. Diabetes screening. This is done by checking your blood sugar (glucose) after you have not eaten for a while (fasting). You may have this done every 1-3 years. Abdominal aortic aneurysm (AAA) screening. You may need this if you are a current or former smoker. Osteoporosis. You may be screened starting at age 82 if you are at high risk. Talk with your health care provider about your test results, treatment options, and if necessary, the need for more tests. Vaccines  Your  health care provider may recommend  certain vaccines, such as: Influenza vaccine. This is recommended every year. Tetanus, diphtheria, and acellular pertussis (Tdap, Td) vaccine. You may need a Td booster every 10 years. Zoster vaccine. You may need this after age 19. Pneumococcal 13-valent conjugate (PCV13) vaccine. One dose is recommended after age 27. Pneumococcal polysaccharide (PPSV23) vaccine. One dose is recommended after age 71. Talk to your health care provider about which screenings and vaccines you need and how often you need them. This information is not intended to replace advice given to you by your health care provider. Make sure you discuss any questions you have with your health care provider. Document Released: 03/21/2015 Document Revised: 11/12/2015 Document Reviewed: 12/24/2014 Elsevier Interactive Patient Education  2017 [place] Prevention in the Home Falls can cause injuries. They can happen to people of all ages. There are many things you can do to make your home safe and to help prevent falls. What can I do on the outside of my home? Regularly fix the edges of walkways and driveways and fix any cracks. Remove anything that might make you trip as you walk through a door, such as a raised step or threshold. Trim any bushes or trees on the path to your home. Use bright outdoor lighting. Clear any walking paths of anything that might make someone trip, such as rocks or tools. Regularly check to see if handrails are loose or broken. Make sure that both sides of any steps have handrails. Any raised decks and porches should have guardrails on the edges. Have any leaves, snow, or ice cleared regularly. Use sand or salt on walking paths during winter. Clean up any spills in your garage right away. This includes oil or grease spills. What can I do in the bathroom? Use night lights. Install grab bars by the toilet and in the tub and shower. Do not use towel bars as grab bars. Use non-skid mats or  decals in the tub or shower. If you need to sit down in the shower, use a plastic, non-slip stool. Keep the floor dry. Clean up any water that spills on the floor as soon as it happens. Remove soap buildup in the tub or shower regularly. Attach bath mats securely with double-sided non-slip rug tape. Do not have throw rugs and other things on the floor that can make you trip. What can I do in the bedroom? Use night lights. Make sure that you have a light by your bed that is easy to reach. Do not use any sheets or blankets that are too big for your bed. They should not hang down onto the floor. Have a firm chair that has side arms. You can use this for support while you get dressed. Do not have throw rugs and other things on the floor that can make you trip. What can I do in the kitchen? Clean up any spills right away. Avoid walking on wet floors. Keep items that you use a lot in easy-to-reach places. If you need to reach something above you, use a strong step stool that has a grab bar. Keep electrical cords out of the way. Do not use floor polish or wax that makes floors slippery. If you must use wax, use non-skid floor wax. Do not have throw rugs and other things on the floor that can make you trip. What can I do with my stairs? Do not leave any items on the stairs. Make sure that there are handrails  on both sides of the stairs and use them. Fix handrails that are broken or loose. Make sure that handrails are as long as the stairways. Check any carpeting to make sure that it is firmly attached to the stairs. Fix any carpet that is loose or worn. Avoid having throw rugs at the top or bottom of the stairs. If you do have throw rugs, attach them to the floor with carpet tape. Make sure that you have a light switch at the top of the stairs and the bottom of the stairs. If you do not have them, ask someone to add them for you. What else can I do to help prevent falls? Wear shoes that: Do not  have high heels. Have rubber bottoms. Are comfortable and fit you well. Are closed at the toe. Do not wear sandals. If you use a stepladder: Make sure that it is fully opened. Do not climb a closed stepladder. Make sure that both sides of the stepladder are locked into place. Ask someone to hold it for you, if possible. Clearly mark and make sure that you can see: Any grab bars or handrails. First and last steps. Where the edge of each step is. Use tools that help you move around (mobility aids) if they are needed. These include: Canes. Walkers. Scooters. Crutches. Turn on the lights when you go into a dark area. Replace any light bulbs as soon as they burn out. Set up your furniture so you have a clear path. Avoid moving your furniture around. If any of your floors are uneven, fix them. If there are any pets around you, be aware of where they are. Review your medicines with your doctor. Some medicines can make you feel dizzy. This can increase your chance of falling. Ask your doctor what other things that you can do to help prevent falls. This information is not intended to replace advice given to you by your health care provider. Make sure you discuss any questions you have with your health care provider. Document Released: 12/19/2008 Document Revised: 07/31/2015 Document Reviewed: 03/29/2014 Elsevier Interactive Patient Education  2017 [employer].

## 2020-11-25 ENCOUNTER — Encounter: Payer: Self-pay | Admitting: Internal Medicine

## 2020-11-25 ENCOUNTER — Other Ambulatory Visit: Payer: Self-pay

## 2020-11-25 ENCOUNTER — Ambulatory Visit (INDEPENDENT_AMBULATORY_CARE_PROVIDER_SITE_OTHER): Payer: Medicare PPO | Admitting: Internal Medicine

## 2020-11-25 VITALS — BP 120/82 | HR 57 | Temp 97.9°F | Ht 70.0 in | Wt 147.4 lb

## 2020-11-25 DIAGNOSIS — R202 Paresthesia of skin: Secondary | ICD-10-CM

## 2020-11-25 DIAGNOSIS — E559 Vitamin D deficiency, unspecified: Secondary | ICD-10-CM | POA: Diagnosis not present

## 2020-11-25 DIAGNOSIS — E034 Atrophy of thyroid (acquired): Secondary | ICD-10-CM

## 2020-11-25 DIAGNOSIS — Z125 Encounter for screening for malignant neoplasm of prostate: Secondary | ICD-10-CM | POA: Diagnosis not present

## 2020-11-25 DIAGNOSIS — Z0000 Encounter for general adult medical examination without abnormal findings: Secondary | ICD-10-CM | POA: Diagnosis not present

## 2020-11-25 DIAGNOSIS — R413 Other amnesia: Secondary | ICD-10-CM

## 2020-11-25 DIAGNOSIS — N529 Male erectile dysfunction, unspecified: Secondary | ICD-10-CM

## 2020-11-25 DIAGNOSIS — R931 Abnormal findings on diagnostic imaging of heart and coronary circulation: Secondary | ICD-10-CM

## 2020-11-25 DIAGNOSIS — E785 Hyperlipidemia, unspecified: Secondary | ICD-10-CM

## 2020-11-25 LAB — URINALYSIS
Bilirubin Urine: NEGATIVE
Hgb urine dipstick: NEGATIVE
Ketones, ur: NEGATIVE
Leukocytes,Ua: NEGATIVE
Nitrite: NEGATIVE
Specific Gravity, Urine: 1.015 (ref 1.000–1.030)
Urine Glucose: NEGATIVE
Urobilinogen, UA: 0.2 — AB (ref 0.0–1.0)
pH: 7 (ref 5.0–8.0)

## 2020-11-25 LAB — COMPREHENSIVE METABOLIC PANEL
ALT: 19 U/L (ref 0–53)
AST: 32 U/L (ref 0–37)
Albumin: 4.3 g/dL (ref 3.5–5.2)
Alkaline Phosphatase: 50 U/L (ref 39–117)
BUN: 16 mg/dL (ref 6–23)
CO2: 29 mEq/L (ref 19–32)
Calcium: 9.7 mg/dL (ref 8.4–10.5)
Chloride: 104 mEq/L (ref 96–112)
Creatinine, Ser: 0.81 mg/dL (ref 0.40–1.50)
GFR: 89.09 mL/min (ref >60.00)
Glucose, Bld: 95 mg/dL (ref 70–99)
Potassium: 4.6 mEq/L (ref 3.5–5.1)
Sodium: 141 mEq/L (ref 135–145)
Total Bilirubin: 0.5 mg/dL (ref 0.2–1.2)
Total Protein: 7.4 g/dL (ref 6.0–8.3)

## 2020-11-25 LAB — LIPID PANEL
Cholesterol: 158 mg/dL (ref 0–200)
HDL: 84.1 mg/dL (ref >39.00)
LDL Cholesterol: 68 mg/dL (ref 0–99)
NonHDL: 74.31
Total CHOL/HDL Ratio: 2
Triglycerides: 30 mg/dL (ref 0.0–149.0)
VLDL: 6 mg/dL (ref 0.0–40.0)

## 2020-11-25 LAB — TSH: TSH: 3.42 u[IU]/mL (ref 0.35–5.50)

## 2020-11-25 LAB — PSA: PSA: 1.32 ng/mL (ref 0.10–4.00)

## 2020-11-25 LAB — VITAMIN D 25 HYDROXY (VIT D DEFICIENCY, FRACTURES): VITD: 64.46 ng/mL (ref 30.00–100.00)

## 2020-11-25 LAB — VITAMIN B12: Vitamin B-12: 483 pg/mL (ref 211–911)

## 2020-11-25 NOTE — Assessment & Plan Note (Signed)
F/u w/Dr Bell 

## 2020-11-25 NOTE — Assessment & Plan Note (Signed)
Check TSH 

## 2020-11-25 NOTE — Progress Notes (Signed)
Subjective:  Patient ID: Travis Morse, male    DOB: 1951/03/01  Age: 70 y.o. MRN: [id number]  CC: Annual Exam   HPI Travis Morse presents for a well exam C/o memory loss - worse; however, no major issues Will ski a lot this winter -30 days or more C/o being moody at times   Outpatient Medications Prior to Visit  Medication Sig Dispense Refill   Avanafil (STENDRA) 100 MG TABS Take 100 mg by mouth daily as needed. 20 tablet 5   b complex vitamins tablet Take 1 tablet by mouth daily.     Beet Root POWD Take by mouth 2 (two) times daily. Mixes 1 tbsp in 6 oz of water     Cholecalciferol (VITAMIN D) 2000 units tablet Take 2,000 Units by mouth daily.     Coenzyme Q10 (CO Q 10 PO) Take by mouth daily.     fish oil-omega-3 fatty acids 1000 MG capsule Take 4,000 mg by mouth 2 (two) times daily.     GLUCOSAMINE SULFATE PO Take 2 tablets by mouth daily.     levothyroxine (SYNTHROID) 50 MCG tablet Take 1 tablet (50 mcg total) by mouth daily. 90 tablet 1   Multiple Vitamin (MULTIVITAMIN WITH MINERALS) TABS tablet Take 1 tablet by mouth daily.     rosuvastatin (CRESTOR) 10 MG tablet Take 10 mg by mouth every other day.      triamcinolone ointment (KENALOG) 0.5 % Apply 1 application topically 2 (two) times daily. Skin cracks 45 g 1   TURMERIC PO Take 1 tablet by mouth 2 (two) times daily. Less than a teaspoonful -takes with tomato paste and pepper     pantoprazole (PROTONIX) 40 MG tablet Take 1 tablet (40 mg total) by mouth daily. (Patient not taking: Reported on 11/25/2020) 30 tablet 6   No facility-administered medications prior to visit.    ROS: Review of Systems  Constitutional:  Positive for unexpected weight change. Negative for appetite change and fatigue.  HENT:  Negative for congestion, nosebleeds, sneezing, sore throat and trouble swallowing.   Eyes:  Negative for itching and visual disturbance.  Respiratory:  Negative for cough.   Cardiovascular:  Negative for chest  pain, palpitations and leg swelling.  Gastrointestinal:  Negative for abdominal distention, blood in stool, diarrhea and nausea.  Genitourinary:  Negative for frequency and hematuria.  Musculoskeletal:  Negative for back pain, gait problem, joint swelling and neck pain.  Skin:  Negative for rash.  Neurological:  Negative for dizziness, tremors, speech difficulty and weakness.  Psychiatric/Behavioral:  Positive for decreased concentration. Negative for agitation, dysphoric mood, sleep disturbance and suicidal ideas. The patient is not nervous/anxious.    Objective:  BP 120/82 (BP Location: Left Arm)   Pulse (!) 57   Temp 97.9 F (36.6 C) (Oral)   Ht 5\' 10"  (1.778 m)   Wt 147 lb 6.4 oz (66.9 kg)   SpO2 97%   BMI 21.15 kg/m   BP Readings from Last 3 Encounters:  11/25/20 120/82  10/23/20 118/60  05/26/20 110/78    Wt Readings from Last 3 Encounters:  11/25/20 147 lb 6.4 oz (66.9 kg)  10/23/20 152 lb (68.9 kg)  05/26/20 152 lb 12.8 oz (69.3 kg)    Physical Exam Constitutional:      General: He is not in acute distress.    Appearance: He is well-developed.     Comments: NAD  Eyes:     Conjunctiva/sclera: Conjunctivae normal.     Pupils: Pupils  are equal, round, and reactive to light.  Neck:     Thyroid: No thyromegaly.     Vascular: No JVD.  Cardiovascular:     Rate and Rhythm: Normal rate and regular rhythm.     Heart sounds: Normal heart sounds. No murmur heard.   No friction rub. No gallop.  Pulmonary:     Effort: Pulmonary effort is normal. No respiratory distress.     Breath sounds: Normal breath sounds. No wheezing or rales.  Chest:     Chest wall: No tenderness.  Abdominal:     General: Bowel sounds are normal. There is no distension.     Palpations: Abdomen is soft. There is no mass.     Tenderness: There is no abdominal tenderness. There is no guarding or rebound.  Musculoskeletal:        General: No tenderness. Normal range of motion.     Cervical back:  Normal range of motion.  Lymphadenopathy:     Cervical: No cervical adenopathy.  Skin:    General: Skin is warm and dry.     Findings: No rash.  Neurological:     Mental Status: He is alert and oriented to person, place, and time.     Cranial Nerves: No cranial nerve deficit.     Motor: No abnormal muscle tone.     Coordination: Coordination normal.     Gait: Gait normal.     Deep Tendon Reflexes: Reflexes are normal and symmetric.  Psychiatric:        Behavior: Behavior normal.        Thought Content: Thought content normal.        Judgment: Judgment normal.  Rectal - per GI  Lab Results  Component Value Date   WBC 5.8 11/19/2019   HGB 14.5 11/19/2019   HCT 41.5 11/19/2019   PLT 224.0 11/19/2019   GLUCOSE 106 (H) 05/26/2020   CHOL 156 05/26/2020   TRIG 43.0 05/26/2020   HDL 74.70 05/26/2020   LDLDIRECT 117.3 01/21/2011   LDLCALC 73 05/26/2020   ALT 19 05/26/2020   AST 27 05/26/2020   NA 138 05/26/2020   K 4.2 05/26/2020   CL 101 05/26/2020   CREATININE 0.75 05/26/2020   BUN 11 05/26/2020   CO2 31 05/26/2020   TSH 2.71 11/19/2019   PSA 1.14 11/19/2019   HGBA1C 5.6 10/17/2017    VAS US CAROTID  Result Date: 06/04/2020 Carotid Arterial Duplex Study Indications:      Bilateral bruits and Patient denies any cerebrovascular                   symptoms. Risk Factors:     Hyperlipidemia, no history of smoking, coronary artery                   disease. Comparison Study: None Performing Technologist: Alecia Mackin RVT, RDCS (AE), RDMS  Examination Guidelines: A complete evaluation includes B-mode imaging, spectral Doppler, color Doppler, and power Doppler as needed of all accessible portions of each vessel. Bilateral testing is considered an integral part of a complete examination. Limited examinations for reoccurring indications may be performed as noted.  Right Carotid Findings: +----------+--------+--------+--------+------------------+--------+           PSV cm/sEDV  cm/sStenosisPlaque DescriptionComments +----------+--------+--------+--------+------------------+--------+ CCA Prox  80      19                                         +----------+--------+--------+--------+------------------+--------+  CCA Distal67      20                                         +----------+--------+--------+--------+------------------+--------+ ICA Prox  87      27                                         +----------+--------+--------+--------+------------------+--------+ ICA Mid   74      34                                         +----------+--------+--------+--------+------------------+--------+ ICA Distal76      38                                         +----------+--------+--------+--------+------------------+--------+ ECA       83      11                                         +----------+--------+--------+--------+------------------+--------+ +----------+--------+-------+----------------+-------------------+           PSV cm/sEDV cmsDescribe        Arm Pressure (mmHG) +----------+--------+-------+----------------+-------------------+ [id number]            Multiphasic, [id number]                 +----------+--------+-------+----------------+-------------------+ +---------+--------+--+--------+--+---------+ VertebralPSV cm/s38EDV cm/s16Antegrade +---------+--------+--+--------+--+---------+  Left Carotid Findings: +----------+--------+--------+--------+------------------+------------------+           PSV cm/sEDV cm/sStenosisPlaque DescriptionComments           +----------+--------+--------+--------+------------------+------------------+ CCA Prox  112     32                                                   +----------+--------+--------+--------+------------------+------------------+ CCA Distal93      30                                intimal thickening  +----------+--------+--------+--------+------------------+------------------+ ICA Prox  83      27                                                   +----------+--------+--------+--------+------------------+------------------+ ICA Mid   76      38                                                   +----------+--------+--------+--------+------------------+------------------+ ICA Distal75      36                                                   +----------+--------+--------+--------+------------------+------------------+  ECA       80      9                                                    +----------+--------+--------+--------+------------------+------------------+ +----------+--------+--------+----------------+-------------------+           PSV cm/sEDV cm/sDescribe        Arm Pressure (mmHG) +----------+--------+--------+----------------+-------------------+ [id number]             Multiphasic, [id number]                 +----------+--------+--------+----------------+-------------------+ +---------+--------+--+--------+--+---------+ VertebralPSV cm/s42EDV cm/s20Antegrade +---------+--------+--+--------+--+---------+   Summary: Right Carotid: There was no evidence of thrombus, dissection, atherosclerotic                plaque or stenosis in the cervical carotid system. Left Carotid: The extracranial vessels were near-normal with only minimal wall               thickening or plaque. Vertebrals:  Bilateral vertebral arteries demonstrate antegrade flow. Subclavians: Normal flow hemodynamics were seen in bilateral subclavian              arteries. Incidental finding of large dominant left thyroid nodule measuring 3.3 x 2.3 x 2.2 cm, slightly hyperechoic in echotexture. If clinically indicated a dedicated thyroid ultrasound could be ordered. *See table(s) above for measurements and observations.  Electronically signed by Jenkins Rouge MD on 06/04/2020 at 5:05:46 PM.    Final      Assessment & Plan:   Problem List Items Addressed This Visit     Erectile dysfunction    F/u w/Dr Gloriann Loan      Hypothyroidism    Check TSH      Well adult exam     We discussed age appropriate health related issues, including available/recomended screening tests and vaccinations. Labs were ordered to be later reviewed . All questions were answered. We discussed one or more of the following - seat belt use, use of sunscreen/sun exposure exercise, fall risk reduction, second hand smoke exposure, firearm use and storage, seat belt use, a need for adhering to healthy diet and exercise. Labs were ordered.  All questions were answered. Coloduard 2018 (-)  Colon 2021 due in 2026 Dr Henrene Pastor          Follow-up: No follow-ups on file.  Walker Kehr, MD

## 2020-11-25 NOTE — Assessment & Plan Note (Signed)
  We discussed age appropriate health related issues, including available/recomended screening tests and vaccinations. Labs were ordered to be later reviewed . All questions were answered. We discussed one or more of the following - seat belt use, use of sunscreen/sun exposure exercise, fall risk reduction, second hand smoke exposure, firearm use and storage, seat belt use, a need for adhering to healthy diet and exercise. Labs were ordered.  All questions were answered. Coloduard 2018 (-)  Colon 2021 due in 2026 Dr Henrene Pastor

## 2020-11-26 LAB — CBC WITH DIFFERENTIAL/PLATELET
Basophils Absolute: 0.1 10*3/uL (ref 0.0–0.1)
Basophils Relative: 1 % (ref 0.0–3.0)
Eosinophils Absolute: 0.2 10*3/uL (ref 0.0–0.7)
Eosinophils Relative: 3 % (ref 0.0–5.0)
HCT: 41.6 % (ref 39.0–52.0)
Hemoglobin: 14.2 g/dL (ref 13.0–17.0)
Lymphocytes Relative: 28.5 % (ref 12.0–46.0)
Lymphs Abs: 1.6 10*3/uL (ref 0.7–4.0)
MCHC: 34.2 g/dL (ref 30.0–36.0)
MCV: 95.9 fl (ref 78.0–100.0)
Monocytes Absolute: 0.4 10*3/uL (ref 0.1–1.0)
Monocytes Relative: 7.8 % (ref 3.0–12.0)
Neutro Abs: 3.3 10*3/uL (ref 1.4–7.7)
Neutrophils Relative %: 59.7 % (ref 43.0–77.0)
Platelets: 216 10*3/uL (ref 150.0–400.0)
RBC: 4.34 Mil/uL (ref 4.22–5.81)
RDW: 13.2 % (ref 11.5–15.5)
WBC: 5.5 10*3/uL (ref 4.0–10.5)

## 2020-11-26 NOTE — Assessment & Plan Note (Signed)
Mild - MCD Try Lion's mane

## 2020-11-26 NOTE — Assessment & Plan Note (Signed)
Continue with Crestor and aspirin

## 2020-12-02 ENCOUNTER — Ambulatory Visit: Payer: Medicare PPO | Admitting: Sports Medicine

## 2020-12-04 ENCOUNTER — Ambulatory Visit: Payer: Medicare PPO | Admitting: Sports Medicine

## 2020-12-04 VITALS — Ht 70.0 in | Wt 150.0 lb

## 2020-12-04 DIAGNOSIS — R202 Paresthesia of skin: Secondary | ICD-10-CM | POA: Diagnosis not present

## 2020-12-04 NOTE — Patient Instructions (Signed)
Thank you for coming to see me today. It was a pleasure. Today we talked about:   Your nerve pain can be coming from your back.  Add a Vitamin B6 supplement and the stretches that we showed you to see if this makes a difference.  If it is bothering you more, or especially if you notice weakness, come back to see [place].  If you have any questions or concerns, please do not hesitate to call the office at [phone].  Best,   Ari[place] Constable, DO [place]

## 2020-12-04 NOTE — Assessment & Plan Note (Addendum)
Discussed with patient that his symptoms are most consistent with irritation of L5-S1 nerve root likely secondary to some degenerative changes, especially since he is having worsening after lying flat at night, but then improves over time and does not recur throughout the day.  This would be very atypical for neuropathy related to diabetes, especially given his excellent health.  He also has excellent lower extremity pulses, doubt vascular component.  Given that he is having minimal pain throughout the day, can start him on vitamin B6 supplementation for nerve irritation and have him perform knee-to-chest and pelvic tilt low back exercises.  He can follow-up as needed, advised to follow-up right away if he is having worsening in his lower extremities.  At that point, would consider imaging and EMG, but it would not change management at this time.

## 2020-12-04 NOTE — Progress Notes (Signed)
Travis Morse is a 70 y.o. male who presents to [facility] today for the following:  Bilateral leg burning pain Started over 5 years ago Reports that it "feels like there is hot sauce on my legs from my mid leg to my ankle every morning" Last about 15 to 30 minutes Reports that it does worsen after the days when he is more active Never has this occur throughout the day or in other locations Noticed that it decreased when he decreased his sugar intake a few years ago States that this occurs every day Denies any significant back pain, did notice some pain recently, but it improved with stretching Denies any saddle anesthesias, changes in bowel or bladder habits, leg weakness  He denies numbness and tingling in other locations Reports that he swing dances every weekend and sometimes throughout the week which she has started doing over the last few years more so than running For running he does do treadmill intervals, usually about 4 miles per session totaling about 8 to 10 miles per week Overall he is hoping to stay active as possible in the coming years and is hoping for some help with this Has a prior history of a right tibial plateau fracture in December 2014, but states that his numbness and tingling/burning pain was occurring before this He does report a history of hypothyroidism that is currently well controlled on Synthroid 50 mcg Also is currently on Crestor 10 mg daily, did have to decrease this dose secondary to myalgias, but had the burning pain prior to initiation of Crestor He also reports a prior elevated coronary artery calcium score for which she was placed on a statin   PMH reviewed.  ROS as above. Medications reviewed.  Exam:  Ht 5\' 10"  (1.778 m)   Wt 150 lb (68 kg)   BMI 21.52 kg/m  Gen: Well NAD MSK:  Lumbar spine: - Inspection: no gross deformity or asymmetry, swelling or ecchymosis - Palpation: No TTP over the spinous processes, paraspinal muscles, or SI joints  b/l - ROM: full active ROM of the lumbar spine in flexion and extension without pain - Strength: 5/5 strength of lower extremity in L4-S1 nerve root distributions b/l; normal gait - Neuro: sensation intact in the L4-S1 nerve root distribution b/l, 2+ L4 and S1 reflexes - Special testing: Negative straight leg raise, Negative FABER, FADIR b/l   No results found.   Assessment and Plan: 1) Paresthesia Discussed with patient that his symptoms are most consistent with irritation of L5-S1 nerve root likely secondary to some degenerative changes, especially since he is having worsening after lying flat at night, but then improves over time and does not recur throughout the day.  This would be very atypical for neuropathy related to diabetes, especially given his excellent health.  He also has excellent lower extremity pulses, doubt vascular component.  Given that he is having minimal pain throughout the day, can start him on vitamin B6 supplementation for nerve irritation and have him perform knee-to-chest and pelvic tilt low back exercises.  He can follow-up as needed, advised to follow-up right away if he is having worsening in his lower extremities.  At that point, would consider imaging and EMG, but it would not change management at this time.   [place] Constable, D.O.  PGY-4 [facility]  12/04/2020 5:14 PM  I observed and examined the patient with the resident and agree with assessment and plan.  Note reviewed and modified by me. KB  Oneida Alar, MD

## 2021-01-26 DIAGNOSIS — M609 Myositis, unspecified: Secondary | ICD-10-CM | POA: Diagnosis not present

## 2021-01-26 DIAGNOSIS — R931 Abnormal findings on diagnostic imaging of heart and coronary circulation: Secondary | ICD-10-CM | POA: Diagnosis not present

## 2021-01-26 DIAGNOSIS — E039 Hypothyroidism, unspecified: Secondary | ICD-10-CM | POA: Diagnosis not present

## 2021-01-26 DIAGNOSIS — Z79899 Other long term (current) drug therapy: Secondary | ICD-10-CM | POA: Diagnosis not present

## 2021-01-28 ENCOUNTER — Other Ambulatory Visit: Payer: Self-pay | Admitting: *Deleted

## 2021-01-28 MED ORDER — LEVOTHYROXINE SODIUM 50 MCG PO TABS: 50.0000 ug | ORAL_TABLET | Freq: Every day | ORAL | 1 refills | Status: DC

## 2021-02-09 DIAGNOSIS — L821 Other seborrheic keratosis: Secondary | ICD-10-CM | POA: Diagnosis not present

## 2021-02-09 DIAGNOSIS — Z85828 Personal history of other malignant neoplasm of skin: Secondary | ICD-10-CM | POA: Diagnosis not present

## 2021-02-09 DIAGNOSIS — Z85820 Personal history of malignant melanoma of skin: Secondary | ICD-10-CM | POA: Diagnosis not present

## 2021-04-01 ENCOUNTER — Encounter: Payer: Self-pay | Admitting: Internal Medicine

## 2021-04-01 NOTE — Telephone Encounter (Signed)
Per chart med not in med list../l,b

## 2021-04-06 ENCOUNTER — Other Ambulatory Visit: Payer: Self-pay | Admitting: Internal Medicine

## 2021-04-06 MED ORDER — TADALAFIL 5 MG PO TABS: 5.0000 mg | ORAL_TABLET | Freq: Every day | ORAL | 11 refills | Status: DC

## 2021-04-06 NOTE — Telephone Encounter (Signed)
Pt requesting to pick up a hard copy rx for Tadalafil 5mg  90 count  Please call pt when rx is ready for pick up

## 2021-04-06 NOTE — Telephone Encounter (Signed)
Notified pt via mychart rx ready for pick-up.Marland KitchenJohny Morse

## 2021-04-25 DIAGNOSIS — K529 Noninfective gastroenteritis and colitis, unspecified: Secondary | ICD-10-CM | POA: Diagnosis not present

## 2021-04-25 DIAGNOSIS — E876 Hypokalemia: Secondary | ICD-10-CM | POA: Diagnosis not present

## 2021-04-25 DIAGNOSIS — Z20822 Contact with and (suspected) exposure to covid-19: Secondary | ICD-10-CM | POA: Diagnosis not present

## 2021-04-25 DIAGNOSIS — K3189 Other diseases of stomach and duodenum: Secondary | ICD-10-CM | POA: Diagnosis not present

## 2021-04-25 DIAGNOSIS — Z79890 Hormone replacement therapy (postmenopausal): Secondary | ICD-10-CM | POA: Diagnosis not present

## 2021-04-25 DIAGNOSIS — R197 Diarrhea, unspecified: Secondary | ICD-10-CM | POA: Diagnosis not present

## 2021-04-25 DIAGNOSIS — E039 Hypothyroidism, unspecified: Secondary | ICD-10-CM | POA: Diagnosis not present

## 2021-04-25 DIAGNOSIS — R1110 Vomiting, unspecified: Secondary | ICD-10-CM | POA: Diagnosis not present

## 2021-04-25 DIAGNOSIS — N281 Cyst of kidney, acquired: Secondary | ICD-10-CM | POA: Diagnosis not present

## 2021-04-25 DIAGNOSIS — Z79899 Other long term (current) drug therapy: Secondary | ICD-10-CM | POA: Diagnosis not present

## 2021-04-25 DIAGNOSIS — R1033 Periumbilical pain: Secondary | ICD-10-CM | POA: Diagnosis not present

## 2021-04-25 DIAGNOSIS — E785 Hyperlipidemia, unspecified: Secondary | ICD-10-CM | POA: Diagnosis not present

## 2021-04-25 DIAGNOSIS — R079 Chest pain, unspecified: Secondary | ICD-10-CM | POA: Diagnosis not present

## 2021-04-26 DIAGNOSIS — E039 Hypothyroidism, unspecified: Secondary | ICD-10-CM | POA: Diagnosis not present

## 2021-04-26 DIAGNOSIS — K529 Noninfective gastroenteritis and colitis, unspecified: Secondary | ICD-10-CM | POA: Diagnosis not present

## 2021-04-26 DIAGNOSIS — E876 Hypokalemia: Secondary | ICD-10-CM | POA: Diagnosis not present

## 2021-04-26 DIAGNOSIS — E785 Hyperlipidemia, unspecified: Secondary | ICD-10-CM | POA: Diagnosis not present

## 2021-08-08 ENCOUNTER — Encounter: Payer: Self-pay | Admitting: Internal Medicine

## 2021-08-11 DIAGNOSIS — Z85820 Personal history of malignant melanoma of skin: Secondary | ICD-10-CM | POA: Diagnosis not present

## 2021-08-11 DIAGNOSIS — L820 Inflamed seborrheic keratosis: Secondary | ICD-10-CM | POA: Diagnosis not present

## 2021-08-11 DIAGNOSIS — L821 Other seborrheic keratosis: Secondary | ICD-10-CM | POA: Diagnosis not present

## 2021-08-11 DIAGNOSIS — B353 Tinea pedis: Secondary | ICD-10-CM | POA: Diagnosis not present

## 2021-08-11 DIAGNOSIS — L245 Irritant contact dermatitis due to other chemical products: Secondary | ICD-10-CM | POA: Diagnosis not present

## 2021-08-11 DIAGNOSIS — Z85828 Personal history of other malignant neoplasm of skin: Secondary | ICD-10-CM | POA: Diagnosis not present

## 2021-08-11 DIAGNOSIS — L812 Freckles: Secondary | ICD-10-CM | POA: Diagnosis not present

## 2021-08-12 ENCOUNTER — Other Ambulatory Visit: Payer: Self-pay | Admitting: Internal Medicine

## 2021-08-12 DIAGNOSIS — Z0000 Encounter for general adult medical examination without abnormal findings: Secondary | ICD-10-CM

## 2021-08-12 DIAGNOSIS — E785 Hyperlipidemia, unspecified: Secondary | ICD-10-CM

## 2021-08-13 ENCOUNTER — Other Ambulatory Visit (INDEPENDENT_AMBULATORY_CARE_PROVIDER_SITE_OTHER): Payer: Medicare PPO

## 2021-08-13 DIAGNOSIS — E785 Hyperlipidemia, unspecified: Secondary | ICD-10-CM

## 2021-08-13 DIAGNOSIS — Z0000 Encounter for general adult medical examination without abnormal findings: Secondary | ICD-10-CM | POA: Diagnosis not present

## 2021-08-14 LAB — URINALYSIS
Bilirubin Urine: NEGATIVE
Hgb urine dipstick: NEGATIVE
Ketones, ur: NEGATIVE
Leukocytes,Ua: NEGATIVE
Nitrite: NEGATIVE
Specific Gravity, Urine: 1.01 (ref 1.000–1.030)
Total Protein, Urine: NEGATIVE
Urine Glucose: NEGATIVE
Urobilinogen, UA: 0.2 (ref 0.0–1.0)
pH: 7 (ref 5.0–8.0)

## 2021-08-14 LAB — CBC WITH DIFFERENTIAL/PLATELET
Basophils Absolute: 0 10*3/uL (ref 0.0–0.1)
Basophils Relative: 0.9 % (ref 0.0–3.0)
Eosinophils Absolute: 0.1 10*3/uL (ref 0.0–0.7)
Eosinophils Relative: 2.9 % (ref 0.0–5.0)
HCT: 40.6 % (ref 39.0–52.0)
Hemoglobin: 13.8 g/dL (ref 13.0–17.0)
Lymphocytes Relative: 29.3 % (ref 12.0–46.0)
Lymphs Abs: 1.4 10*3/uL (ref 0.7–4.0)
MCHC: 34.1 g/dL (ref 30.0–36.0)
MCV: 97.2 fl (ref 78.0–100.0)
Monocytes Absolute: 0.4 10*3/uL (ref 0.1–1.0)
Monocytes Relative: 8.2 % (ref 3.0–12.0)
Neutro Abs: 2.8 10*3/uL (ref 1.4–7.7)
Neutrophils Relative %: 58.7 % (ref 43.0–77.0)
Platelets: 254 10*3/uL (ref 150.0–400.0)
RBC: 4.18 Mil/uL — ABNORMAL LOW (ref 4.22–5.81)
RDW: 12.5 % (ref 11.5–15.5)
WBC: 4.7 10*3/uL (ref 4.0–10.5)

## 2021-08-14 LAB — COMPREHENSIVE METABOLIC PANEL
ALT: 19 U/L (ref 0–53)
AST: 27 U/L (ref 0–37)
Albumin: 4.2 g/dL (ref 3.5–5.2)
Alkaline Phosphatase: 55 U/L (ref 39–117)
BUN: 9 mg/dL (ref 6–23)
CO2: 29 mEq/L (ref 19–32)
Calcium: 9.7 mg/dL (ref 8.4–10.5)
Chloride: 101 mEq/L (ref 96–112)
Creatinine, Ser: 0.75 mg/dL (ref 0.40–1.50)
GFR: 90.73 mL/min (ref >60.00)
Glucose, Bld: 98 mg/dL (ref 70–99)
Potassium: 4.2 mEq/L (ref 3.5–5.1)
Sodium: 139 mEq/L (ref 135–145)
Total Bilirubin: 0.4 mg/dL (ref 0.2–1.2)
Total Protein: 7.1 g/dL (ref 6.0–8.3)

## 2021-08-14 LAB — TSH: TSH: 4.82 u[IU]/mL (ref 0.35–5.50)

## 2021-08-14 LAB — PSA: PSA: 1.42 ng/mL (ref 0.10–4.00)

## 2021-08-14 LAB — LIPID PANEL
Cholesterol: 143 mg/dL (ref 0–200)
HDL: 76.4 mg/dL (ref >39.00)
LDL Cholesterol: 60 mg/dL (ref 0–99)
NonHDL: 67.01
Total CHOL/HDL Ratio: 2
Triglycerides: 36 mg/dL (ref 0.0–149.0)
VLDL: 7.2 mg/dL (ref 0.0–40.0)

## 2021-08-17 ENCOUNTER — Ambulatory Visit: Payer: Medicare PPO | Admitting: Internal Medicine

## 2021-08-17 ENCOUNTER — Encounter: Payer: Self-pay | Admitting: Internal Medicine

## 2021-08-17 VITALS — BP 110/80 | HR 50 | Temp 98.5°F | Ht 70.0 in | Wt 151.0 lb

## 2021-08-17 DIAGNOSIS — E034 Atrophy of thyroid (acquired): Secondary | ICD-10-CM | POA: Diagnosis not present

## 2021-08-17 DIAGNOSIS — E785 Hyperlipidemia, unspecified: Secondary | ICD-10-CM | POA: Diagnosis not present

## 2021-08-17 DIAGNOSIS — R413 Other amnesia: Secondary | ICD-10-CM | POA: Diagnosis not present

## 2021-08-17 DIAGNOSIS — I2583 Coronary atherosclerosis due to lipid rich plaque: Secondary | ICD-10-CM | POA: Diagnosis not present

## 2021-08-17 DIAGNOSIS — I2510 Atherosclerotic heart disease of native coronary artery without angina pectoris: Secondary | ICD-10-CM | POA: Diagnosis not present

## 2021-08-17 DIAGNOSIS — Z0000 Encounter for general adult medical examination without abnormal findings: Secondary | ICD-10-CM | POA: Diagnosis not present

## 2021-08-17 NOTE — Assessment & Plan Note (Signed)
Cont on Levothroid 

## 2021-08-17 NOTE — Patient Instructions (Addendum)
Get a wearable speaker

## 2021-08-17 NOTE — Assessment & Plan Note (Signed)
Cont on Lion's mane

## 2021-08-17 NOTE — Assessment & Plan Note (Signed)
Cont on Crestor and Zetia 

## 2021-08-17 NOTE — Progress Notes (Signed)
Subjective:  Patient ID: Travis Morse, male    DOB: 1950/09/15  Age: 71 y.o. MRN: [id number]  CC: No chief complaint on file.   HPI Travis Morse presents for CAD, dyslipidemia C/o elevated BP 120-130  Outpatient Medications Prior to Visit  Medication Sig Dispense Refill   b complex vitamins tablet Take 1 tablet by mouth daily.     Beet Root POWD Take by mouth 2 (two) times daily. Mixes 1 tbsp in 6 oz of water     Cholecalciferol (VITAMIN D) 2000 units tablet Take 2,000 Units by mouth daily.     Coenzyme Q10 (CO Q 10 PO) Take by mouth daily.     ezetimibe (ZETIA) 10 MG tablet Take 10 mg by mouth daily.     fish oil-omega-3 fatty acids 1000 MG capsule Take 4,000 mg by mouth 2 (two) times daily.     GLUCOSAMINE SULFATE PO Take 2 tablets by mouth daily.     levothyroxine (SYNTHROID) 50 MCG tablet Take 1 tablet (50 mcg total) by mouth daily. 90 tablet 1   Multiple Vitamin (MULTIVITAMIN WITH MINERALS) TABS tablet Take 1 tablet by mouth daily.     rosuvastatin (CRESTOR) 10 MG tablet Take 10 mg by mouth every other day.      tadalafil (CIALIS) 5 MG tablet Take 1 tablet (5 mg total) by mouth daily. 30 tablet 11   TURMERIC PO Take 1 tablet by mouth 2 (two) times daily. Less than a teaspoonful -takes with tomato paste and pepper     No facility-administered medications prior to visit.    ROS: Review of Systems  Constitutional:  Negative for appetite change, fatigue and unexpected weight change.  HENT:  Negative for congestion, nosebleeds, sneezing, sore throat and trouble swallowing.   Eyes:  Negative for itching and visual disturbance.  Respiratory:  Negative for cough.   Cardiovascular:  Negative for chest pain, palpitations and leg swelling.  Gastrointestinal:  Negative for abdominal distention, blood in stool, diarrhea and nausea.  Genitourinary:  Negative for frequency and hematuria.  Musculoskeletal:  Negative for back pain, gait problem, joint swelling and neck pain.   Skin:  Negative for rash.  Neurological:  Negative for dizziness, tremors, speech difficulty and weakness.  Psychiatric/Behavioral:  Negative for agitation, dysphoric mood, sleep disturbance and suicidal ideas. The patient is not nervous/anxious.     Objective:  BP 110/80 (BP Location: Left Arm, Patient Position: Sitting, Cuff Size: Normal)   Pulse (!) 50   Temp 98.5 F (36.9 C) (Oral)   Ht '5\' 10"'$  (1.778 m)   Wt 151 lb (68.5 kg)   SpO2 96%   BMI 21.67 kg/m   BP Readings from Last 3 Encounters:  08/17/21 110/80  11/25/20 120/82  10/23/20 118/60    Wt Readings from Last 3 Encounters:  08/17/21 151 lb (68.5 kg)  12/04/20 150 lb (68 kg)  11/25/20 147 lb 6.4 oz (66.9 kg)    Physical Exam Constitutional:      General: He is not in acute distress.    Appearance: He is well-developed.     Comments: NAD  Eyes:     Conjunctiva/sclera: Conjunctivae normal.     Pupils: Pupils are equal, round, and reactive to light.  Neck:     Thyroid: No thyromegaly.     Vascular: No JVD.  Cardiovascular:     Rate and Rhythm: Normal rate and regular rhythm.     Heart sounds: Normal heart sounds. No murmur heard.  No friction rub. No gallop.  Pulmonary:     Effort: Pulmonary effort is normal. No respiratory distress.     Breath sounds: Normal breath sounds. No wheezing or rales.  Chest:     Chest wall: No tenderness.  Abdominal:     General: Bowel sounds are normal. There is no distension.     Palpations: Abdomen is soft. There is no mass.     Tenderness: There is no abdominal tenderness. There is no guarding or rebound.  Musculoskeletal:        General: No tenderness. Normal range of motion.     Cervical back: Normal range of motion.  Lymphadenopathy:     Cervical: No cervical adenopathy.  Skin:    General: Skin is warm and dry.     Findings: No rash.  Neurological:     Mental Status: He is alert and oriented to person, place, and time.     Cranial Nerves: No cranial nerve  deficit.     Motor: No abnormal muscle tone.     Coordination: Coordination normal.     Gait: Gait normal.     Deep Tendon Reflexes: Reflexes are normal and symmetric.  Psychiatric:        Behavior: Behavior normal.        Thought Content: Thought content normal.        Judgment: Judgment normal.     Lab Results  Component Value Date   WBC 4.7 08/13/2021   HGB 13.8 08/13/2021   HCT 40.6 08/13/2021   PLT 254.0 08/13/2021   GLUCOSE 98 08/13/2021   CHOL 143 08/13/2021   TRIG 36.0 08/13/2021   HDL 76.40 08/13/2021   LDLDIRECT 117.3 01/21/2011   LDLCALC 60 08/13/2021   ALT 19 08/13/2021   AST 27 08/13/2021   NA 139 08/13/2021   K 4.2 08/13/2021   CL 101 08/13/2021   CREATININE 0.75 08/13/2021   BUN 9 08/13/2021   CO2 29 08/13/2021   TSH 4.82 08/13/2021   PSA 1.42 08/13/2021   HGBA1C 5.6 10/17/2017    VAS US CAROTID  Result Date: 06/04/2020 Carotid Arterial Duplex Study Indications:      Bilateral bruits and Patient denies any cerebrovascular                   symptoms. Risk Factors:     Hyperlipidemia, no history of smoking, coronary artery                   disease. Comparison Study: None Performing Technologist: Alecia Mackin RVT, RDCS (AE), RDMS  Examination Guidelines: A complete evaluation includes B-mode imaging, spectral Doppler, color Doppler, and power Doppler as needed of all accessible portions of each vessel. Bilateral testing is considered an integral part of a complete examination. Limited examinations for reoccurring indications may be performed as noted.  Right Carotid Findings: +----------+--------+--------+--------+------------------+--------+           PSV cm/sEDV cm/sStenosisPlaque DescriptionComments +----------+--------+--------+--------+------------------+--------+ CCA Prox  80      19                                         +----------+--------+--------+--------+------------------+--------+ CCA Distal67      20                                          +----------+--------+--------+--------+------------------+--------+  ICA Prox  87      27                                         +----------+--------+--------+--------+------------------+--------+ ICA Mid   74      34                                         +----------+--------+--------+--------+------------------+--------+ ICA Distal76      38                                         +----------+--------+--------+--------+------------------+--------+ ECA       83      11                                         +----------+--------+--------+--------+------------------+--------+ +----------+--------+-------+----------------+-------------------+           PSV cm/sEDV cmsDescribe        Arm Pressure (mmHG) +----------+--------+-------+----------------+-------------------+ [id number]            Multiphasic, [id number]                 +----------+--------+-------+----------------+-------------------+ +---------+--------+--+--------+--+---------+ VertebralPSV cm/s38EDV cm/s16Antegrade +---------+--------+--+--------+--+---------+  Left Carotid Findings: +----------+--------+--------+--------+------------------+------------------+           PSV cm/sEDV cm/sStenosisPlaque DescriptionComments           +----------+--------+--------+--------+------------------+------------------+ CCA Prox  112     32                                                   +----------+--------+--------+--------+------------------+------------------+ CCA Distal93      30                                intimal thickening +----------+--------+--------+--------+------------------+------------------+ ICA Prox  83      27                                                   +----------+--------+--------+--------+------------------+------------------+ ICA Mid   76      38                                                    +----------+--------+--------+--------+------------------+------------------+ ICA Distal75      36                                                   +----------+--------+--------+--------+------------------+------------------+ ECA       80      9                                                    +----------+--------+--------+--------+------------------+------------------+ +----------+--------+--------+----------------+-------------------+  PSV cm/sEDV cm/sDescribe        Arm Pressure (mmHG) +----------+--------+--------+----------------+-------------------+ [id number]             Multiphasic, [id number]                 +----------+--------+--------+----------------+-------------------+ +---------+--------+--+--------+--+---------+ VertebralPSV cm/s42EDV cm/s20Antegrade +---------+--------+--+--------+--+---------+   Summary: Right Carotid: There was no evidence of thrombus, dissection, atherosclerotic                plaque or stenosis in the cervical carotid system. Left Carotid: The extracranial vessels were near-normal with only minimal wall               thickening or plaque. Vertebrals:  Bilateral vertebral arteries demonstrate antegrade flow. Subclavians: Normal flow hemodynamics were seen in bilateral subclavian              arteries. Incidental finding of large dominant left thyroid nodule measuring 3.3 x 2.3 x 2.2 cm, slightly hyperechoic in echotexture. If clinically indicated a dedicated thyroid ultrasound could be ordered. *See table(s) above for measurements and observations.  Electronically signed by Jenkins Rouge MD on 06/04/2020 at 5:05:46 PM.    Final     Assessment & Plan:   Problem List Items Addressed This Visit     Coronary atherosclerosis    Cont on Crestor and Zetia      Relevant Medications   ezetimibe (ZETIA) 10 MG tablet   Dyslipidemia     Cont on Crestor and Zetia      Relevant Medications   ezetimibe (ZETIA) 10 MG tablet    Hypothyroidism - Primary    Cont on Levothroid      Memory changes    Cont on Lion's mane      Well adult exam   Relevant Orders   TSH   Urinalysis   CBC with Differential/Platelet   Lipid panel   PSA   Comprehensive metabolic panel      No orders of the defined types were placed in this encounter.     Follow-up: Return in about 6 months (around 02/16/2022) for Wellness Exam.  Walker Kehr, MD

## 2021-08-17 NOTE — Assessment & Plan Note (Addendum)
Cont on Crestor and Zetia 

## 2021-10-22 ENCOUNTER — Other Ambulatory Visit: Payer: Self-pay | Admitting: Internal Medicine

## 2021-10-26 ENCOUNTER — Telehealth: Payer: Self-pay | Admitting: Internal Medicine

## 2021-10-26 NOTE — Telephone Encounter (Signed)
LVM for pt to rtn my call to schedule AWV with [facility] call back # [phone] 

## 2021-11-02 ENCOUNTER — Ambulatory Visit (INDEPENDENT_AMBULATORY_CARE_PROVIDER_SITE_OTHER): Payer: Medicare PPO

## 2021-11-02 VITALS — Ht 70.0 in | Wt 146.0 lb

## 2021-11-02 DIAGNOSIS — Z0000 Encounter for general adult medical examination without abnormal findings: Secondary | ICD-10-CM

## 2021-11-02 NOTE — Progress Notes (Addendum)
Subjective:   Travis Morse is a 71 y.o. male who presents for Medicare Annual/Subsequent preventive examination.   Virtual Visit via Telephone Note  I connected with  Travis Morse on 11/02/21 at  2:30 PM EDT by telephone and verified that I am speaking with the correct person using two identifiers.  Location: Patient: Home  Provider: [facility]  Persons participating in the virtual visit: patient/Nurse Health Advisor   I discussed the limitations, risks, security and privacy concerns of performing an evaluation and management service by telephone and the availability of in person appointments. The patient expressed understanding and agreed to proceed.  Interactive audio and video telecommunications were attempted between this nurse and patient, however failed, due to patient having technical difficulties OR patient did not have access to video capability.  We continued and completed visit with audio only.  Some vital signs may be absent or patient reported.   Daphane Shepherd, LPN  Review of Systems     Cardiac Risk Factors include: advanced age (>21mn, >>35women);dyslipidemia     Objective:    Today's Vitals   11/02/21 1439  Weight: 146 lb (66.2 kg)  Height: 5' 10" (1.778 m)   Body mass index is 20.95 kg/m.     11/02/2021    2:54 PM 10/23/2020    2:36 PM 10/23/2016    7:49 AM 09/14/2013    8:46 AM  Advanced Directives  Does Patient Have a Medical Advance Directive? Yes Yes Yes Patient has advance directive, copy not in chart  Type of Advance Directive [place] Living will;Healthcare Power of [place] will;Advance instruction for mental health treatment  Does patient want to make changes to medical advance directive?  No - Patient declined No - Patient declined   Copy of HA[place]in Chart? No - copy requested No - copy requested  Yes     Current Medications (verified) Outpatient Encounter Medications as of 11/02/2021  Medication Sig   b complex vitamins tablet Take 1 tablet by mouth daily.   BABY ASPIRIN PO Take by mouth.   Beet Root POWD Take by mouth 2 (two) times daily. Mixes 1 tbsp in 6 oz of water   Cholecalciferol (VITAMIN D) 2000 units tablet Take 2,000 Units by mouth daily.   Chromium-Cinnamon (CINNAMON PLUS CHROMIUM PO) Take by mouth.   Coenzyme Q10 (CO Q 10 PO) Take by mouth daily.   ezetimibe (ZETIA) 10 MG tablet Take 10 mg by mouth daily.   ferrous sulfate 220 (44 Fe) MG/5ML solution Take 220 mg by mouth daily.   fish oil-omega-3 fatty acids 1000 MG capsule Take 4,000 mg by mouth 2 (two) times daily.   Garlic 17654MG CAPS Take by mouth.   GLUCOSAMINE SULFATE PO Take 2 tablets by mouth daily.   levothyroxine (SYNTHROID) 50 MCG tablet Take 1 tablet by mouth once daily   Misc Natural Products (GINKOGIN PO) Take by mouth.   Multiple Vitamin (MULTIVITAMIN WITH MINERALS) TABS tablet Take 1 tablet by mouth daily.   PUMPKIN SEED PO Take by mouth.   Resveratrol 250 MG CAPS Take by mouth.   rosuvastatin (CRESTOR) 10 MG tablet Take 10 mg by mouth every other day.    tadalafil (CIALIS) 5 MG tablet Take 1 tablet (5 mg total) by mouth daily.   TURMERIC PO Take 1 tablet by mouth 2 (two) times daily. Less than a teaspoonful -takes  with tomato paste and pepper   No facility-administered encounter medications on file as of 11/02/2021.    Allergies (verified) Patient has no known allergies.   History: Past Medical History:  Diagnosis Date   Allergy    allergic to bee stings   Anemia    Anxiety    Cancer ([place])    SKIN   Depression    History of kidney stones    Hx of colonic polyps    Dr Henrene Pastor   Hypothyroidism 2008   Insomnia    Memory difficulty    Multiple thyroid nodules    Past Surgical History:  Procedure Laterality Date   Arm fracture     as a child   COLONOSCOPY     COLONOSCOPY     OPEN  REDUCTION INTERNAL FIXATION (ORIF) METACARPAL Right 10/23/2016   Procedure: Right thumb closed reduction and pinning;  Surgeon: Iran Planas, MD;  Location: [facility];  Service: Orthopedics;  Laterality: Right;   POLYPECTOMY     THYROIDECTOMY, PARTIAL     TIBIA FRACTURE SURGERY     Family History  Problem Relation Age of Onset   Coronary artery disease Father        32's   Heart disease Father 40       MI   Thyroid disease Neg Hx    Colon polyps Neg Hx    Colon cancer Neg Hx    Esophageal cancer Neg Hx    Rectal cancer Neg Hx    Stomach cancer Neg Hx    Social History   Socioeconomic History   Marital status: Single    Spouse name: Not on file   Number of children: Not on file   Years of education: Not on file   Highest education level: Not on file  Occupational History   Not on file  Tobacco Use   Smoking status: Never   Smokeless tobacco: Never  Vaping Use   Vaping Use: Never used  Substance and Sexual Activity   Alcohol use: Yes    Comment: 1 glass a wine a day    Drug use: No   Sexual activity: Yes    Partners: Female  Other Topics Concern   Not on file  Social History Narrative   Not on file   Social Determinants of Health   Financial Resource Strain: Low Risk  (11/02/2021)   Overall Financial Resource Strain (CARDIA)    Difficulty of Paying Living Expenses: Not hard at all  Food Insecurity: No Food Insecurity (11/02/2021)   Hunger Vital Sign    Worried About Running Out of Food in the Last Year: Never true    Ran Out of Food in the Last Year: Never true  Transportation Needs: No Transportation Needs (11/02/2021)   PRAPARE - Hydrologist (Medical): No    Lack of Transportation (Non-Medical): No  Physical Activity: Sufficiently Active (11/02/2021)   Exercise Vital Sign    Days of Exercise per Week: 5 days    Minutes of Exercise per Session: 50 min  Stress: No Stress Concern Present (11/02/2021)   [place]    Feeling of Stress : Not at all  Social Connections: Moderately Integrated (11/02/2021)   Social Connection and Isolation Panel [NHANES]    Frequency of Communication with Friends and Family: Twice a week    Frequency of Social Gatherings with Friends and Family: Twice a week    Attends  Religious Services: Never    Active Member of Clubs or Organizations: Yes    Attends Music therapist: More than 4 times per year    Marital Status: Living with partner    Tobacco Counseling Counseling given: Not Answered   Clinical Intake:  Pre-visit preparation completed: No  Pain : No/denies pain     Diabetes: No  How often do you need to have someone help you when you read instructions, pamphlets, or other written materials from your doctor or pharmacy?: 1 - Never What is the last grade level you completed in school?: [place] Needed?: No  Information entered by :: L.Wilson,LPN   Activities of Daily Living    11/02/2021    2:54 PM  In your present state of health, do you have any difficulty performing the following activities:  Hearing? 0  Vision? 0  Difficulty concentrating or making decisions? 0  Walking or climbing stairs? 0  Dressing or bathing? 0  Doing errands, shopping? 0  Preparing Food and eating ? N  Using the Toilet? N  In the past six months, have you accidently leaked urine? N  Do you have problems with loss of bowel control? N  Managing your Medications? N  Managing your Finances? N  Housekeeping or managing your Housekeeping? N    Patient Care Team: Plotnikov, Evie Lacks, MD as PCP - General Henrene Pastor Docia Chuck, MD as Consulting Physician (Gastroenterology) Redmond Baseman, MD as Referring Physician (Cardiology) Lucas Mallow, MD as Consulting Physician (Urology) Laurence Aly, OD as Consulting Physician (Optometry)  Indicate any recent Medical Services you may have received  from other than [facility] providers in the past year (date may be approximate).     Assessment:   This is a routine wellness examination for Amar.  Hearing/Vision screen Vision Screening - Comments:: Declined wear glasses   Dietary issues and exercise activities discussed:     Goals Addressed               This Visit's Progress     Patient Stated (pt-stated)   On track     My goal is to live a very active life by staying independent and physically active.       Depression Screen    11/02/2021    2:57 PM 11/02/2021    2:56 PM 11/02/2021    2:54 PM 10/23/2020    2:35 PM 10/17/2017    2:06 PM 10/14/2016    3:33 PM 08/20/2015    2:29 PM  PHQ 2/9 Scores  PHQ - 2 Score 0 0 0 0 0 0 1    Fall Risk    11/02/2021    2:41 PM 11/25/2020    3:14 PM 10/23/2020    2:38 PM 11/22/2019    3:06 PM 10/17/2017    2:06 PM  [place] in the past year? 0 0 0 0 No  Number falls in past yr: 0 0 0    Injury with Fall? 0 0 0 0   Risk for fall due to : No Fall Risks No Fall Risks No Fall Risks    Follow up Falls prevention discussed  Falls evaluation completed      FALL RISK PREVENTION PERTAINING TO THE HOME:  Any stairs in or around the home? Yes  If so, are there any without handrails? No  Home free of loose throw rugs in walkways, pet beds, electrical cords, etc? Yes  Adequate  lighting in your home to reduce risk of falls? Yes   ASSISTIVE DEVICES UTILIZED TO PREVENT FALLS:  Life alert? No  Use of a cane, walker or w/c? No  Grab bars in the bathroom? No  Shower chair or bench in shower? No  Elevated toilet seat or a handicapped toilet? No       11/02/2021    3:03 PM  6CIT Screen  What Year? 0 points  What month? 0 points  What time? 0 points  Count back from 20 0 points  Months in reverse 0 points  Repeat phrase 0 points  Total Score 0 points    Immunizations Immunization History  Administered Date(s) Administered   Fluad Quad(high Dose 65+) 12/07/2019    Influenza Split 01/26/2011, 01/24/2012   Influenza Whole 12/19/2004, 12/28/2006   Influenza, High Dose Seasonal PF 02/20/2016, 01/10/2018, 01/17/2019   Influenza,inj,Quad PF,6+ Mos 01/17/2013, 12/14/2013, 02/19/2015   MMR 09/05/2017   PFIZER(Purple Top)SARS-COV-2 Vaccination 03/28/2019, 04/23/2019, 12/01/2019, 07/26/2020   Pneumococcal Conjugate-13 09/19/2015   Pneumococcal Polysaccharide-23 02/19/2015   Td 06/27/2007   Tdap 08/05/2017   Typhoid Live 09/06/2017   Zoster Recombinat (Shingrix) 01/10/2018, 04/27/2018   Zoster, Live 01/24/2012    TDAP status: Up to date  Flu Vaccine status: Up to date  Pneumococcal vaccine status: Up to date  Covid-19 vaccine status: Completed vaccines  Qualifies for Shingles Vaccine? Yes   Zostavax completed Yes   Shingrix Completed?: Yes  Screening Tests Health Maintenance  Topic Date Due   COVID-19 Vaccine (5 - Pfizer risk series) 09/20/2020   INFLUENZA VACCINE  10/06/2021   TETANUS/TDAP  08/06/2027   COLONOSCOPY (Pts 45-49yrs Insurance coverage will need to be confirmed)  08/19/2029   Pneumonia Vaccine 65+ Years old  Completed   Hepatitis C Screening  Completed   Zoster Vaccines- Shingrix  Completed   HPV VACCINES  Aged Out   Fecal DNA (Cologuard)  Discontinued    Health Maintenance  Health Maintenance Due  Topic Date Due   COVID-19 Vaccine (5 - Pfizer risk series) 09/20/2020   INFLUENZA VACCINE  10/06/2021    Colorectal cancer screening: Type of screening: Colonoscopy. Completed 08/20/2019. Repeat every 10 years  Lung Cancer Screening: (Low Dose CT Chest recommended if Age 55-80 years, 30 pack-year currently smoking OR have quit w/in 15years.) does not qualify.   Lung Cancer Screening Referral: n/a  Additional Screening:  Hepatitis C Screening: does not qualify; 09/16/2015  Vision Screening: Recommended annual ophthalmology exams for early detection of glaucoma and other disorders of the eye. Is the patient up to date  with their annual eye exam?  No  Who is the provider or what is the name of the office in which the patient attends annual eye exams? [facility]  If pt is not established with a provider, would they like to be referred to a provider to establish care? No .   Dental Screening: Recommended annual dental exams for proper oral hygiene  Community Resource Referral / Chronic Care Management: CRR required this visit?  No   CCM required this visit?  No      Plan:     I have personally reviewed and noted the following in the patient's chart:   Medical and social history Use of alcohol, tobacco or illicit drugs  Current medications and supplements including opioid prescriptions. Patient is not currently taking opioid prescriptions. Functional ability and status Nutritional status Physical activity Advanced directives List of other physicians Hospitalizations, surgeries, and ER visits in   previous 12 months Vitals Screenings to include cognitive, depression, and falls Referrals and appointments  In addition, I have reviewed and discussed with patient certain preventive protocols, quality metrics, and best practice recommendations. A written personalized care plan for preventive services as well as general preventive health recommendations were provided to patient.      L Wilson, LPN   11/02/2021   Nurse Notes: Aim for 30 minutes of exercise or brisk walking, 6-8 glasses of water, and 5 servings of fruits and vegetables each day.   Medical screening examination/treatment/procedure(s) were performed by non-physician practitioner and as supervising physician I was immediately available for consultation/collaboration.  I agree with above. Aleksei Plotnikov, MD  

## 2021-11-02 NOTE — Patient Instructions (Signed)
Mr. Travis Morse , Thank you for taking time to come for your Medicare Wellness Visit. I appreciate your ongoing commitment to your health goals. Please review the following plan we discussed and let me know if I can assist you in the future.   Screening recommendations/referrals: Colonoscopy: 08/20/2019  due 08/2029 Recommended yearly ophthalmology/optometry visit for glaucoma screening and checkup Recommended yearly dental visit for hygiene and checkup  Vaccinations: Influenza vaccine: completed  Pneumococcal vaccine: completed  Tdap vaccine: 08/05/2017 Shingles vaccine: completed    Covid-19: completed   Advanced directives: yes   Conditions/risks identified: Aim for 30 minutes of exercise or brisk walking, 6-8 glasses of water, and 5 servings of fruits and vegetables each day.   Next appointment: Follow up in one year for your annual wellness visit.   Preventive Care 29 Years and Older, Male  Preventive care refers to lifestyle choices and visits with your health care provider that can promote health and wellness. What does preventive care include? A yearly physical exam. This is also called an annual well check. Dental exams once or twice a year. Routine eye exams. Ask your health care provider how often you should have your eyes checked. Personal lifestyle choices, including: Daily care of your teeth and gums. Regular physical activity. Eating a healthy diet. Avoiding tobacco and drug use. Limiting alcohol use. Practicing safe sex. Taking low doses of aspirin every day. Taking vitamin and mineral supplements as recommended by your health care provider. What happens during an annual well check? The services and screenings done by your health care provider during your annual well check will depend on your age, overall health, lifestyle risk factors, and family history of disease. Counseling  Your health care provider may ask you questions about your: Alcohol use. Tobacco  use. Drug use. Emotional well-being. Home and relationship well-being. Sexual activity. Eating habits. History of falls. Memory and ability to understand (cognition). Work and work Statistician. Screening  You may have the following tests or measurements: Height, weight, and BMI. Blood pressure. Lipid and cholesterol levels. These may be checked every 5 years, or more frequently if you are over 74 years old. Skin check. Lung cancer screening. You may have this screening every year starting at age 62 if you have a 30-pack-year history of smoking and currently smoke or have quit within the past 15 years. Fecal occult blood test (FOBT) of the stool. You may have this test every year starting at age 51. Flexible sigmoidoscopy or colonoscopy. You may have a sigmoidoscopy every 5 years or a colonoscopy every 10 years starting at age 8. Prostate cancer screening. Recommendations will vary depending on your family history and other risks. Hepatitis C blood test. Hepatitis B blood test. Sexually transmitted disease (STD) testing. Diabetes screening. This is done by checking your blood sugar (glucose) after you have not eaten for a while (fasting). You may have this done every 1-3 years. Abdominal aortic aneurysm (AAA) screening. You may need this if you are a current or former smoker. Osteoporosis. You may be screened starting at age 58 if you are at high risk. Talk with your health care provider about your test results, treatment options, and if necessary, the need for more tests. Vaccines  Your health care provider may recommend certain vaccines, such as: Influenza vaccine. This is recommended every year. Tetanus, diphtheria, and acellular pertussis (Tdap, Td) vaccine. You may need a Td booster every 10 years. Zoster vaccine. You may need this after age 7. Pneumococcal 13-valent conjugate (PCV13)  vaccine. One dose is recommended after age 57. Pneumococcal polysaccharide (PPSV23) vaccine.  One dose is recommended after age 37. Talk to your health care provider about which screenings and vaccines you need and how often you need them. This information is not intended to replace advice given to you by your health care provider. Make sure you discuss any questions you have with your health care provider. Document Released: 03/21/2015 Document Revised: 11/12/2015 Document Reviewed: 12/24/2014 Elsevier Interactive Patient Education  2017 [place] Prevention in the Home Falls can cause injuries. They can happen to people of all ages. There are many things you can do to make your home safe and to help prevent falls. What can I do on the outside of my home? Regularly fix the edges of walkways and driveways and fix any cracks. Remove anything that might make you trip as you walk through a door, such as a raised step or threshold. Trim any bushes or trees on the path to your home. Use bright outdoor lighting. Clear any walking paths of anything that might make someone trip, such as rocks or tools. Regularly check to see if handrails are loose or broken. Make sure that both sides of any steps have handrails. Any raised decks and porches should have guardrails on the edges. Have any leaves, snow, or ice cleared regularly. Use sand or salt on walking paths during winter. Clean up any spills in your garage right away. This includes oil or grease spills. What can I do in the bathroom? Use night lights. Install grab bars by the toilet and in the tub and shower. Do not use towel bars as grab bars. Use non-skid mats or decals in the tub or shower. If you need to sit down in the shower, use a plastic, non-slip stool. Keep the floor dry. Clean up any water that spills on the floor as soon as it happens. Remove soap buildup in the tub or shower regularly. Attach bath mats securely with double-sided non-slip rug tape. Do not have throw rugs and other things on the floor that can make  you trip. What can I do in the bedroom? Use night lights. Make sure that you have a light by your bed that is easy to reach. Do not use any sheets or blankets that are too big for your bed. They should not hang down onto the floor. Have a firm chair that has side arms. You can use this for support while you get dressed. Do not have throw rugs and other things on the floor that can make you trip. What can I do in the kitchen? Clean up any spills right away. Avoid walking on wet floors. Keep items that you use a lot in easy-to-reach places. If you need to reach something above you, use a strong step stool that has a grab bar. Keep electrical cords out of the way. Do not use floor polish or wax that makes floors slippery. If you must use wax, use non-skid floor wax. Do not have throw rugs and other things on the floor that can make you trip. What can I do with my stairs? Do not leave any items on the stairs. Make sure that there are handrails on both sides of the stairs and use them. Fix handrails that are broken or loose. Make sure that handrails are as long as the stairways. Check any carpeting to make sure that it is firmly attached to the stairs. Fix any carpet that is loose  or worn. Avoid having throw rugs at the top or bottom of the stairs. If you do have throw rugs, attach them to the floor with carpet tape. Make sure that you have a light switch at the top of the stairs and the bottom of the stairs. If you do not have them, ask someone to add them for you. What else can I do to help prevent falls? Wear shoes that: Do not have high heels. Have rubber bottoms. Are comfortable and fit you well. Are closed at the toe. Do not wear sandals. If you use a stepladder: Make sure that it is fully opened. Do not climb a closed stepladder. Make sure that both sides of the stepladder are locked into place. Ask someone to hold it for you, if possible. Clearly mark and make sure that you can  see: Any grab bars or handrails. First and last steps. Where the edge of each step is. Use tools that help you move around (mobility aids) if they are needed. These include: Canes. Walkers. Scooters. Crutches. Turn on the lights when you go into a dark area. Replace any light bulbs as soon as they burn out. Set up your furniture so you have a clear path. Avoid moving your furniture around. If any of your floors are uneven, fix them. If there are any pets around you, be aware of where they are. Review your medicines with your doctor. Some medicines can make you feel dizzy. This can increase your chance of falling. Ask your doctor what other things that you can do to help prevent falls. This information is not intended to replace advice given to you by your health care provider. Make sure you discuss any questions you have with your health care provider. Document Released: 12/19/2008 Document Revised: 07/31/2015 Document Reviewed: 03/29/2014 Elsevier Interactive Patient Education  2017 [employer].

## 2021-12-09 DIAGNOSIS — I10 Essential (primary) hypertension: Secondary | ICD-10-CM | POA: Diagnosis not present

## 2021-12-09 DIAGNOSIS — R931 Abnormal findings on diagnostic imaging of heart and coronary circulation: Secondary | ICD-10-CM | POA: Diagnosis not present

## 2022-02-08 DIAGNOSIS — Z85820 Personal history of malignant melanoma of skin: Secondary | ICD-10-CM | POA: Diagnosis not present

## 2022-02-08 DIAGNOSIS — L570 Actinic keratosis: Secondary | ICD-10-CM | POA: Diagnosis not present

## 2022-02-08 DIAGNOSIS — L812 Freckles: Secondary | ICD-10-CM | POA: Diagnosis not present

## 2022-02-08 DIAGNOSIS — L820 Inflamed seborrheic keratosis: Secondary | ICD-10-CM | POA: Diagnosis not present

## 2022-02-08 DIAGNOSIS — Z85828 Personal history of other malignant neoplasm of skin: Secondary | ICD-10-CM | POA: Diagnosis not present

## 2022-02-08 DIAGNOSIS — L821 Other seborrheic keratosis: Secondary | ICD-10-CM | POA: Diagnosis not present

## 2022-03-20 ENCOUNTER — Encounter: Payer: Self-pay | Admitting: Internal Medicine

## 2022-03-25 ENCOUNTER — Ambulatory Visit: Payer: Medicare PPO | Admitting: Internal Medicine

## 2022-03-25 ENCOUNTER — Encounter: Payer: Self-pay | Admitting: Internal Medicine

## 2022-03-25 VITALS — BP 110/70 | HR 78 | Temp 98.4°F | Ht 70.0 in | Wt 149.6 lb

## 2022-03-25 DIAGNOSIS — M25542 Pain in joints of left hand: Secondary | ICD-10-CM

## 2022-03-25 DIAGNOSIS — E041 Nontoxic single thyroid nodule: Secondary | ICD-10-CM

## 2022-03-25 DIAGNOSIS — R413 Other amnesia: Secondary | ICD-10-CM | POA: Diagnosis not present

## 2022-03-25 DIAGNOSIS — I2510 Atherosclerotic heart disease of native coronary artery without angina pectoris: Secondary | ICD-10-CM | POA: Diagnosis not present

## 2022-03-25 DIAGNOSIS — Z0000 Encounter for general adult medical examination without abnormal findings: Secondary | ICD-10-CM

## 2022-03-25 DIAGNOSIS — E034 Atrophy of thyroid (acquired): Secondary | ICD-10-CM | POA: Diagnosis not present

## 2022-03-25 DIAGNOSIS — I2583 Coronary atherosclerosis due to lipid rich plaque: Secondary | ICD-10-CM | POA: Diagnosis not present

## 2022-03-25 DIAGNOSIS — E785 Hyperlipidemia, unspecified: Secondary | ICD-10-CM | POA: Diagnosis not present

## 2022-03-25 LAB — URINALYSIS
Bilirubin Urine: NEGATIVE
Hgb urine dipstick: NEGATIVE
Ketones, ur: NEGATIVE
Leukocytes,Ua: NEGATIVE
Nitrite: NEGATIVE
Specific Gravity, Urine: 1.025 (ref 1.000–1.030)
Total Protein, Urine: NEGATIVE
Urine Glucose: NEGATIVE
Urobilinogen, UA: 0.2 (ref 0.0–1.0)
pH: 6 (ref 5.0–8.0)

## 2022-03-25 LAB — CBC WITH DIFFERENTIAL/PLATELET
Basophils Absolute: 0.1 10*3/uL (ref 0.0–0.1)
Basophils Relative: 1.8 % (ref 0.0–3.0)
Eosinophils Absolute: 0.1 10*3/uL (ref 0.0–0.7)
Eosinophils Relative: 3.5 % (ref 0.0–5.0)
HCT: 43.7 % (ref 39.0–52.0)
Hemoglobin: 14.7 g/dL (ref 13.0–17.0)
Lymphocytes Relative: 30.9 % (ref 12.0–46.0)
Lymphs Abs: 1.2 10*3/uL (ref 0.7–4.0)
MCHC: 33.7 g/dL (ref 30.0–36.0)
MCV: 97.4 fl (ref 78.0–100.0)
Monocytes Absolute: 0.3 10*3/uL (ref 0.1–1.0)
Monocytes Relative: 8.5 % (ref 3.0–12.0)
Neutro Abs: 2.2 10*3/uL (ref 1.4–7.7)
Neutrophils Relative %: 55.3 % (ref 43.0–77.0)
Platelets: 242 10*3/uL (ref 150.0–400.0)
RBC: 4.48 Mil/uL (ref 4.22–5.81)
RDW: 12.5 % (ref 11.5–15.5)
WBC: 4 10*3/uL (ref 4.0–10.5)

## 2022-03-25 LAB — PSA: PSA: 1.5 ng/mL (ref 0.10–4.00)

## 2022-03-25 LAB — TSH: TSH: 4.13 u[IU]/mL (ref 0.35–5.50)

## 2022-03-25 NOTE — Assessment & Plan Note (Addendum)
On Crestor, Zetia,  ASA F/u w/cardiology

## 2022-03-25 NOTE — Assessment & Plan Note (Signed)
Cont on Crestor and Zetia

## 2022-03-25 NOTE — Patient Instructions (Addendum)
Zojirushi hot water dispenser  Blue-Emu cream -use 2-3 times a day

## 2022-03-25 NOTE — Progress Notes (Signed)
Subjective:  Patient ID: Travis Morse, male    DOB: March 12, 1950  Age: 72 y.o. MRN: [id number]  CC: Follow-up (6 month f/u)   HPI DEVARIO BUCKLEW presents for a 6 mo f/u hypothyroidism, depression, CAD  Outpatient Medications Prior to Visit  Medication Sig Dispense Refill   b complex vitamins tablet Take 1 tablet by mouth daily.     BABY ASPIRIN PO Take by mouth.     Beet Root POWD Take by mouth 2 (two) times daily. Mixes 1 tbsp in 6 oz of water     Cholecalciferol (VITAMIN D) 2000 units tablet Take 2,000 Units by mouth daily.     Chromium-Cinnamon (CINNAMON PLUS CHROMIUM PO) Take by mouth.     Coenzyme Q10 (CO Q 10 PO) Take by mouth daily.     ezetimibe (ZETIA) 10 MG tablet Take 10 mg by mouth daily.     ferrous sulfate 220 (44 Fe) MG/5ML solution Take 220 mg by mouth daily.     fish oil-omega-3 fatty acids 1000 MG capsule Take 4,000 mg by mouth 2 (two) times daily.     Garlic 2951 MG CAPS Take by mouth.     GLUCOSAMINE SULFATE PO Take 2 tablets by mouth daily.     hydrochlorothiazide (HYDRODIURIL) 12.5 MG tablet Take 12.5 mg by mouth daily.     levothyroxine (SYNTHROID) 50 MCG tablet Take 1 tablet by mouth once daily 90 tablet 3   Misc Natural Products (GINKOGIN PO) Take by mouth.     Multiple Vitamin (MULTIVITAMIN WITH MINERALS) TABS tablet Take 1 tablet by mouth daily.     PUMPKIN SEED PO Take by mouth.     Resveratrol 250 MG CAPS Take by mouth.     rosuvastatin (CRESTOR) 10 MG tablet Take 10 mg by mouth every other day.      tadalafil (CIALIS) 5 MG tablet Take 1 tablet (5 mg total) by mouth daily. 30 tablet 11   TURMERIC PO Take 1 tablet by mouth 2 (two) times daily. Less than a teaspoonful -takes with tomato paste and pepper     No facility-administered medications prior to visit.    ROS: Review of Systems  Constitutional:  Negative for appetite change, fatigue and unexpected weight change.  HENT:  Negative for congestion, nosebleeds, sneezing, sore throat and  trouble swallowing.   Eyes:  Negative for itching and visual disturbance.  Respiratory:  Negative for cough.   Cardiovascular:  Negative for chest pain, palpitations and leg swelling.  Gastrointestinal:  Negative for abdominal distention, blood in stool, diarrhea and nausea.  Genitourinary:  Negative for frequency and hematuria.  Musculoskeletal:  Negative for back pain, gait problem, joint swelling and neck pain.  Skin:  Negative for rash.  Neurological:  Negative for dizziness, tremors, speech difficulty and weakness.  Psychiatric/Behavioral:  Negative for agitation, dysphoric mood, sleep disturbance and suicidal ideas. The patient is not nervous/anxious.     Objective:  BP 110/70 (BP Location: Left Arm, Patient Position: Sitting, Cuff Size: Large)   Pulse 78   Temp 98.4 F (36.9 C) (Oral)   Ht '5\' 10"'$  (1.778 m)   Wt 149 lb 9.6 oz (67.9 kg)   SpO2 98%   BMI 21.47 kg/m   BP Readings from Last 3 Encounters:  03/25/22 110/70  08/17/21 110/80  11/25/20 120/82    Wt Readings from Last 3 Encounters:  03/25/22 149 lb 9.6 oz (67.9 kg)  11/02/21 146 lb (66.2 kg)  08/17/21 151 lb (68.5 kg)  Physical Exam Constitutional:      General: He is not in acute distress.    Appearance: He is well-developed.     Comments: NAD  Eyes:     Conjunctiva/sclera: Conjunctivae normal.     Pupils: Pupils are equal, round, and reactive to light.  Neck:     Thyroid: No thyromegaly.     Vascular: No JVD.  Cardiovascular:     Rate and Rhythm: Normal rate and regular rhythm.     Heart sounds: Normal heart sounds. No murmur heard.    No friction rub. No gallop.  Pulmonary:     Effort: Pulmonary effort is normal. No respiratory distress.     Breath sounds: Normal breath sounds. No wheezing or rales.  Chest:     Chest wall: No tenderness.  Abdominal:     General: Bowel sounds are normal. There is no distension.     Palpations: Abdomen is soft. There is no mass.     Tenderness: There is no  abdominal tenderness. There is no guarding or rebound.  Musculoskeletal:        General: No tenderness. Normal range of motion.     Cervical back: Normal range of motion.  Lymphadenopathy:     Cervical: No cervical adenopathy.  Skin:    General: Skin is warm and dry.     Findings: No rash.  Neurological:     Mental Status: He is alert and oriented to person, place, and time.     Cranial Nerves: No cranial nerve deficit.     Motor: No abnormal muscle tone.     Coordination: Coordination normal.     Gait: Gait normal.     Deep Tendon Reflexes: Reflexes are normal and symmetric.  Psychiatric:        Behavior: Behavior normal.        Thought Content: Thought content normal.        Judgment: Judgment normal.     Lab Results  Component Value Date   WBC 4.7 08/13/2021   HGB 13.8 08/13/2021   HCT 40.6 08/13/2021   PLT 254.0 08/13/2021   GLUCOSE 98 08/13/2021   CHOL 143 08/13/2021   TRIG 36.0 08/13/2021   HDL 76.40 08/13/2021   LDLDIRECT 117.3 01/21/2011   LDLCALC 60 08/13/2021   ALT 19 08/13/2021   AST 27 08/13/2021   NA 139 08/13/2021   K 4.2 08/13/2021   CL 101 08/13/2021   CREATININE 0.75 08/13/2021   BUN 9 08/13/2021   CO2 29 08/13/2021   TSH 4.82 08/13/2021   PSA 1.42 08/13/2021   HGBA1C 5.6 10/17/2017    VAS US CAROTID  Result Date: 06/04/2020 Carotid Arterial Duplex Study Indications:      Bilateral bruits and Patient denies any cerebrovascular                   symptoms. Risk Factors:     Hyperlipidemia, no history of smoking, coronary artery                   disease. Comparison Study: None Performing Technologist: Alecia Mackin RVT, RDCS (AE), RDMS  Examination Guidelines: A complete evaluation includes B-mode imaging, spectral Doppler, color Doppler, and power Doppler as needed of all accessible portions of each vessel. Bilateral testing is considered an integral part of a complete examination. Limited examinations for reoccurring indications may be performed as  noted.  Right Carotid Findings: +----------+--------+--------+--------+------------------+--------+           PSV cm/sEDV  cm/sStenosisPlaque DescriptionComments +----------+--------+--------+--------+------------------+--------+ CCA Prox  80      19                                         +----------+--------+--------+--------+------------------+--------+ CCA Distal67      20                                         +----------+--------+--------+--------+------------------+--------+ ICA Prox  87      27                                         +----------+--------+--------+--------+------------------+--------+ ICA Mid   74      34                                         +----------+--------+--------+--------+------------------+--------+ ICA Distal76      38                                         +----------+--------+--------+--------+------------------+--------+ ECA       83      11                                         +----------+--------+--------+--------+------------------+--------+ +----------+--------+-------+----------------+-------------------+           PSV cm/sEDV cmsDescribe        Arm Pressure (mmHG) +----------+--------+-------+----------------+-------------------+ [id number]            Multiphasic, [id number]                 +----------+--------+-------+----------------+-------------------+ +---------+--------+--+--------+--+---------+ VertebralPSV cm/s38EDV cm/s16Antegrade +---------+--------+--+--------+--+---------+  Left Carotid Findings: +----------+--------+--------+--------+------------------+------------------+           PSV cm/sEDV cm/sStenosisPlaque DescriptionComments           +----------+--------+--------+--------+------------------+------------------+ CCA Prox  112     32                                                   +----------+--------+--------+--------+------------------+------------------+ CCA  Distal93      30                                intimal thickening +----------+--------+--------+--------+------------------+------------------+ ICA Prox  83      27                                                   +----------+--------+--------+--------+------------------+------------------+ ICA Mid   76      38                                                   +----------+--------+--------+--------+------------------+------------------+  ICA Distal75      36                                                   +----------+--------+--------+--------+------------------+------------------+ ECA       80      9                                                    +----------+--------+--------+--------+------------------+------------------+ +----------+--------+--------+----------------+-------------------+           PSV cm/sEDV cm/sDescribe        Arm Pressure (mmHG) +----------+--------+--------+----------------+-------------------+ [id number]             Multiphasic, [id number]                 +----------+--------+--------+----------------+-------------------+ +---------+--------+--+--------+--+---------+ VertebralPSV cm/s42EDV cm/s20Antegrade +---------+--------+--+--------+--+---------+   Summary: Right Carotid: There was no evidence of thrombus, dissection, atherosclerotic                plaque or stenosis in the cervical carotid system. Left Carotid: The extracranial vessels were near-normal with only minimal wall               thickening or plaque. Vertebrals:  Bilateral vertebral arteries demonstrate antegrade flow. Subclavians: Normal flow hemodynamics were seen in bilateral subclavian              arteries. Incidental finding of large dominant left thyroid nodule measuring 3.3 x 2.3 x 2.2 cm, slightly hyperechoic in echotexture. If clinically indicated a dedicated thyroid ultrasound could be ordered. *See table(s) above for measurements and observations.   Electronically signed by Jenkins Rouge MD on 06/04/2020 at 5:05:46 PM.    Final     Assessment & Plan:   Problem List Items Addressed This Visit       Cardiovascular and Mediastinum   Coronary atherosclerosis    On Crestor, Zetia,  ASA F/u w/cardiology      Relevant Medications   hydrochlorothiazide (HYDRODIURIL) 12.5 MG tablet     Endocrine   Thyroid nodule    Chronic R>L thyroid node Check [place] 2024      Relevant Orders   US THYROID   Hypothyroidism - Primary    Cont on Levothroid Chronic        Other   Memory changes    Cont on Lion's mane, Medeterian diet        Dyslipidemia     Cont on Crestor and Zetia      Arthralgia    Skiing a lot Blue-Emu cream was recommended to use 2-3 times a day          No orders of the defined types were placed in this encounter.     Follow-up: Return in about 6 months (around 09/23/2022) for Wellness Exam.  Walker Kehr, MD

## 2022-03-25 NOTE — Assessment & Plan Note (Signed)
Cont on Levothroid Chronic

## 2022-03-25 NOTE — Assessment & Plan Note (Signed)
Skiing a lot Blue-Emu cream was recommended to use 2-3 times a day

## 2022-03-25 NOTE — Assessment & Plan Note (Addendum)
Chronic R>L thyroid node Check [place] 2024

## 2022-03-25 NOTE — Assessment & Plan Note (Signed)
Cont on Lion's mane, Medeterian diet

## 2022-03-26 LAB — COMPREHENSIVE METABOLIC PANEL
ALT: 24 U/L (ref 0–53)
AST: 34 U/L (ref 0–37)
Albumin: 4.3 g/dL (ref 3.5–5.2)
Alkaline Phosphatase: 60 U/L (ref 39–117)
BUN: 12 mg/dL (ref 6–23)
CO2: 27 mEq/L (ref 19–32)
Calcium: 9.9 mg/dL (ref 8.4–10.5)
Chloride: 101 mEq/L (ref 96–112)
Creatinine, Ser: 0.82 mg/dL (ref 0.40–1.50)
GFR: 87.94 mL/min (ref >60.00)
Glucose, Bld: 104 mg/dL — ABNORMAL HIGH (ref 70–99)
Potassium: 5.5 mEq/L — ABNORMAL HIGH (ref 3.5–5.1)
Sodium: 140 mEq/L (ref 135–145)
Total Bilirubin: 0.3 mg/dL (ref 0.2–1.2)
Total Protein: 7.3 g/dL (ref 6.0–8.3)

## 2022-03-26 LAB — LIPID PANEL
Cholesterol: 119 mg/dL (ref 0–200)
HDL: 63.5 mg/dL (ref >39.00)
LDL Cholesterol: 48 mg/dL (ref 0–99)
NonHDL: 55.22
Total CHOL/HDL Ratio: 2
Triglycerides: 38 mg/dL (ref 0.0–149.0)
VLDL: 7.6 mg/dL (ref 0.0–40.0)

## 2022-04-07 DIAGNOSIS — I10 Essential (primary) hypertension: Secondary | ICD-10-CM | POA: Diagnosis not present

## 2022-04-07 DIAGNOSIS — R931 Abnormal findings on diagnostic imaging of heart and coronary circulation: Secondary | ICD-10-CM | POA: Diagnosis not present

## 2022-04-19 DIAGNOSIS — I4891 Unspecified atrial fibrillation: Secondary | ICD-10-CM | POA: Diagnosis not present

## 2022-04-19 DIAGNOSIS — R002 Palpitations: Secondary | ICD-10-CM | POA: Diagnosis not present

## 2022-05-10 ENCOUNTER — Ambulatory Visit
Admission: RE | Admit: 2022-05-10 | Discharge: 2022-05-10 | Disposition: A | Discharge status: Home / Self Care | Payer: Medicare PPO | Source: Ambulatory Visit | Attending: Internal Medicine | Admitting: Internal Medicine

## 2022-05-10 DIAGNOSIS — E041 Nontoxic single thyroid nodule: Secondary | ICD-10-CM

## 2022-05-13 DIAGNOSIS — R002 Palpitations: Secondary | ICD-10-CM | POA: Diagnosis not present

## 2022-05-13 DIAGNOSIS — I4891 Unspecified atrial fibrillation: Secondary | ICD-10-CM | POA: Diagnosis not present

## 2022-05-23 ENCOUNTER — Other Ambulatory Visit: Payer: Self-pay | Admitting: Internal Medicine

## 2022-05-23 DIAGNOSIS — E041 Nontoxic single thyroid nodule: Secondary | ICD-10-CM

## 2022-06-22 DIAGNOSIS — I10 Essential (primary) hypertension: Secondary | ICD-10-CM | POA: Diagnosis not present

## 2022-06-22 DIAGNOSIS — E039 Hypothyroidism, unspecified: Secondary | ICD-10-CM | POA: Diagnosis not present

## 2022-06-22 DIAGNOSIS — E041 Nontoxic single thyroid nodule: Secondary | ICD-10-CM | POA: Diagnosis not present

## 2022-06-23 ENCOUNTER — Other Ambulatory Visit: Payer: Self-pay | Admitting: Endocrinology

## 2022-06-23 DIAGNOSIS — E041 Nontoxic single thyroid nodule: Secondary | ICD-10-CM

## 2022-06-30 ENCOUNTER — Other Ambulatory Visit (HOSPITAL_COMMUNITY)
Admission: RE | Admit: 2022-06-30 | Discharge: 2022-06-30 | Disposition: A | Discharge status: Home / Self Care | Payer: Medicare PPO | Source: Ambulatory Visit | Attending: Endocrinology | Admitting: Endocrinology

## 2022-06-30 ENCOUNTER — Ambulatory Visit
Admission: RE | Admit: 2022-06-30 | Discharge: 2022-06-30 | Disposition: A | Discharge status: Home / Self Care | Payer: Medicare PPO | Source: Ambulatory Visit | Attending: Endocrinology | Admitting: Endocrinology

## 2022-06-30 DIAGNOSIS — E041 Nontoxic single thyroid nodule: Secondary | ICD-10-CM | POA: Diagnosis not present

## 2022-06-30 NOTE — Procedures (Signed)
PROCEDURE SUMMARY:  Using direct ultrasound guidance, 5 passes were made using 25 g needles into the nodule within the left inferior lobe of the thyroid.   Ultrasound was used to confirm needle placements on all occasions.   EBL = trace  Specimens were sent to Pathology for analysis.  See procedure note under Imaging tab in Epic for full procedure details.  Mark Benecke H Sandralee Tarkington PA-C 06/30/2022 3:00 PM

## 2022-07-02 LAB — CYTOLOGY - NON PAP

## 2022-07-29 DIAGNOSIS — Z85828 Personal history of other malignant neoplasm of skin: Secondary | ICD-10-CM | POA: Diagnosis not present

## 2022-07-29 DIAGNOSIS — L239 Allergic contact dermatitis, unspecified cause: Secondary | ICD-10-CM | POA: Diagnosis not present

## 2022-07-29 DIAGNOSIS — Z85820 Personal history of malignant melanoma of skin: Secondary | ICD-10-CM | POA: Diagnosis not present

## 2022-08-10 DIAGNOSIS — L821 Other seborrheic keratosis: Secondary | ICD-10-CM | POA: Diagnosis not present

## 2022-08-10 DIAGNOSIS — D1801 Hemangioma of skin and subcutaneous tissue: Secondary | ICD-10-CM | POA: Diagnosis not present

## 2022-08-10 DIAGNOSIS — L812 Freckles: Secondary | ICD-10-CM | POA: Diagnosis not present

## 2022-08-10 DIAGNOSIS — Z85820 Personal history of malignant melanoma of skin: Secondary | ICD-10-CM | POA: Diagnosis not present

## 2022-08-10 DIAGNOSIS — C4441 Basal cell carcinoma of skin of scalp and neck: Secondary | ICD-10-CM | POA: Diagnosis not present

## 2022-08-10 DIAGNOSIS — Z85828 Personal history of other malignant neoplasm of skin: Secondary | ICD-10-CM | POA: Diagnosis not present

## 2022-08-10 DIAGNOSIS — D485 Neoplasm of uncertain behavior of skin: Secondary | ICD-10-CM | POA: Diagnosis not present

## 2022-08-19 ENCOUNTER — Encounter: Payer: Self-pay | Admitting: Dermatology

## 2022-08-23 DIAGNOSIS — R931 Abnormal findings on diagnostic imaging of heart and coronary circulation: Secondary | ICD-10-CM | POA: Diagnosis not present

## 2022-08-23 DIAGNOSIS — E785 Hyperlipidemia, unspecified: Secondary | ICD-10-CM | POA: Diagnosis not present

## 2022-08-23 DIAGNOSIS — I10 Essential (primary) hypertension: Secondary | ICD-10-CM | POA: Diagnosis not present

## 2022-09-22 ENCOUNTER — Ambulatory Visit (INDEPENDENT_AMBULATORY_CARE_PROVIDER_SITE_OTHER): Payer: Medicare PPO | Admitting: Internal Medicine

## 2022-09-22 ENCOUNTER — Encounter: Payer: Self-pay | Admitting: Internal Medicine

## 2022-09-22 VITALS — BP 110/60 | HR 60 | Temp 98.6°F | Ht 70.0 in | Wt 154.0 lb

## 2022-09-22 DIAGNOSIS — R413 Other amnesia: Secondary | ICD-10-CM | POA: Diagnosis not present

## 2022-09-22 DIAGNOSIS — I10 Essential (primary) hypertension: Secondary | ICD-10-CM | POA: Insufficient documentation

## 2022-09-22 DIAGNOSIS — E034 Atrophy of thyroid (acquired): Secondary | ICD-10-CM | POA: Diagnosis not present

## 2022-09-22 DIAGNOSIS — Z0000 Encounter for general adult medical examination without abnormal findings: Secondary | ICD-10-CM

## 2022-09-22 LAB — COMPREHENSIVE METABOLIC PANEL
ALT: 22 U/L (ref 0–53)
AST: 32 U/L (ref 0–37)
Albumin: 4.2 g/dL (ref 3.5–5.2)
Alkaline Phosphatase: 66 U/L (ref 39–117)
BUN: 11 mg/dL (ref 6–23)
CO2: 27 mEq/L (ref 19–32)
Calcium: 9.6 mg/dL (ref 8.4–10.5)
Chloride: 102 mEq/L (ref 96–112)
Creatinine, Ser: 0.74 mg/dL (ref 0.40–1.50)
GFR: 90.39 mL/min (ref >60.00)
Glucose, Bld: 110 mg/dL — ABNORMAL HIGH (ref 70–99)
Potassium: 3.9 mEq/L (ref 3.5–5.1)
Sodium: 139 mEq/L (ref 135–145)
Total Bilirubin: 0.4 mg/dL (ref 0.2–1.2)
Total Protein: 7.2 g/dL (ref 6.0–8.3)

## 2022-09-22 LAB — URINALYSIS
Bilirubin Urine: NEGATIVE
Hgb urine dipstick: NEGATIVE
Ketones, ur: NEGATIVE
Leukocytes,Ua: NEGATIVE
Nitrite: NEGATIVE
Specific Gravity, Urine: >=1.03 — AB (ref 1.000–1.030)
Total Protein, Urine: NEGATIVE
Urine Glucose: NEGATIVE
Urobilinogen, UA: 0.2 (ref 0.0–1.0)
pH: 6 (ref 5.0–8.0)

## 2022-09-22 LAB — CBC WITH DIFFERENTIAL/PLATELET
Basophils Absolute: 0.1 10*3/uL (ref 0.0–0.1)
Basophils Relative: 1 % (ref 0.0–3.0)
Eosinophils Absolute: 0.2 10*3/uL (ref 0.0–0.7)
Eosinophils Relative: 2.8 % (ref 0.0–5.0)
HCT: 40 % (ref 39.0–52.0)
Hemoglobin: 13.4 g/dL (ref 13.0–17.0)
Lymphocytes Relative: 20.7 % (ref 12.0–46.0)
Lymphs Abs: 1.3 10*3/uL (ref 0.7–4.0)
MCHC: 33.6 g/dL (ref 30.0–36.0)
MCV: 95.2 fl (ref 78.0–100.0)
Monocytes Absolute: 0.4 10*3/uL (ref 0.1–1.0)
Monocytes Relative: 6.7 % (ref 3.0–12.0)
Neutro Abs: 4.3 10*3/uL (ref 1.4–7.7)
Neutrophils Relative %: 68.8 % (ref 43.0–77.0)
Platelets: 271 10*3/uL (ref 150.0–400.0)
RBC: 4.2 Mil/uL — ABNORMAL LOW (ref 4.22–5.81)
RDW: 12.7 % (ref 11.5–15.5)
WBC: 6.2 10*3/uL (ref 4.0–10.5)

## 2022-09-22 LAB — LIPID PANEL
Cholesterol: 131 mg/dL (ref 0–200)
HDL: 61.6 mg/dL (ref >39.00)
LDL Cholesterol: 62 mg/dL (ref 0–99)
NonHDL: 69.66
Total CHOL/HDL Ratio: 2
Triglycerides: 38 mg/dL (ref 0.0–149.0)
VLDL: 7.6 mg/dL (ref 0.0–40.0)

## 2022-09-22 LAB — PSA: PSA: 1.98 ng/mL (ref 0.10–4.00)

## 2022-09-22 LAB — TSH: TSH: 2.89 u[IU]/mL (ref 0.35–5.50)

## 2022-09-22 NOTE — Progress Notes (Signed)
Subjective:  Patient ID: Travis Morse, male    DOB: [birth year]  Age: 72 y.o. MRN: [id number]  CC: Annual Exam   HPI Travis Morse presents for a well exam  Outpatient Medications Prior to Visit  Medication Sig Dispense Refill   b complex vitamins tablet Take 1 tablet by mouth daily.     BABY ASPIRIN PO Take by mouth.     Beet Root POWD Take by mouth 2 (two) times daily. Mixes 1 tbsp in 6 oz of water     Cholecalciferol (VITAMIN D) 2000 units tablet Take 2,000 Units by mouth daily.     Chromium-Cinnamon (CINNAMON PLUS CHROMIUM PO) Take by mouth.     Coenzyme Q10 (CO Q 10 PO) Take by mouth daily.     ezetimibe (ZETIA) 10 MG tablet Take 10 mg by mouth daily.     ferrous sulfate 220 (44 Fe) MG/5ML solution Take 220 mg by mouth daily.     fish oil-omega-3 fatty acids 1000 MG capsule Take 4,000 mg by mouth 2 (two) times daily.     Garlic 1000 MG CAPS Take by mouth.     GLUCOSAMINE SULFATE PO Take 2 tablets by mouth daily.     hydrochlorothiazide (HYDRODIURIL) 12.5 MG tablet Take 12.5 mg by mouth daily.     levothyroxine (SYNTHROID) 50 MCG tablet Take 1 tablet by mouth once daily 90 tablet 3   Misc Natural Products (GINKOGIN PO) Take by mouth.     Multiple Vitamin (MULTIVITAMIN WITH MINERALS) TABS tablet Take 1 tablet by mouth daily.     PUMPKIN SEED PO Take by mouth.     Resveratrol 250 MG CAPS Take by mouth.     rosuvastatin (CRESTOR) 10 MG tablet Take 10 mg by mouth every other day.      tadalafil (CIALIS) 5 MG tablet Take 1 tablet (5 mg total) by mouth daily. 30 tablet 11   TURMERIC PO Take 1 tablet by mouth 2 (two) times daily. Less than a teaspoonful -takes with tomato paste and pepper     No facility-administered medications prior to visit.    ROS: Review of Systems  Constitutional:  Negative for appetite change, fatigue and unexpected weight change.  HENT:  Negative for congestion, nosebleeds, sneezing, sore throat and trouble swallowing.   Eyes:  Negative for  itching and visual disturbance.  Respiratory:  Negative for cough.   Cardiovascular:  Negative for chest pain, palpitations and leg swelling.  Gastrointestinal:  Negative for abdominal distention, blood in stool, diarrhea and nausea.  Genitourinary:  Negative for frequency and hematuria.  Musculoskeletal:  Negative for back pain, gait problem, joint swelling and neck pain.  Skin:  Negative for rash.  Neurological:  Negative for dizziness, tremors, speech difficulty and weakness.  Psychiatric/Behavioral:  Negative for agitation, dysphoric mood and sleep disturbance. The patient is not nervous/anxious.     Objective:  BP 110/60 (BP Location: Left Arm, Patient Position: Sitting, Cuff Size: Large)   Pulse 60   Temp 98.6 F (37 C) (Oral)   Ht 5\' 10"  (1.778 m)   Wt 154 lb (69.9 kg)   SpO2 97%   BMI 22.10 kg/m   BP Readings from Last 3 Encounters:  09/22/22 110/60  03/25/22 110/70  08/17/21 110/80    Wt Readings from Last 3 Encounters:  09/22/22 154 lb (69.9 kg)  03/25/22 149 lb 9.6 oz (67.9 kg)  11/02/21 146 lb (66.2 kg)    Physical Exam Constitutional:  General: He is not in acute distress.    Appearance: Normal appearance. He is well-developed.     Comments: NAD  Eyes:     Conjunctiva/sclera: Conjunctivae normal.     Pupils: Pupils are equal, round, and reactive to light.  Neck:     Thyroid: No thyromegaly.     Vascular: No JVD.  Cardiovascular:     Rate and Rhythm: Normal rate and regular rhythm.     Heart sounds: Normal heart sounds. No murmur heard.    No friction rub. No gallop.  Pulmonary:     Effort: Pulmonary effort is normal. No respiratory distress.     Breath sounds: Normal breath sounds. No wheezing or rales.  Chest:     Chest wall: No tenderness.  Abdominal:     General: Bowel sounds are normal. There is no distension.     Palpations: Abdomen is soft. There is no mass.     Tenderness: There is no abdominal tenderness. There is no guarding or  rebound.  Musculoskeletal:        General: No tenderness. Normal range of motion.     Cervical back: Normal range of motion.  Lymphadenopathy:     Cervical: No cervical adenopathy.  Skin:    General: Skin is warm and dry.     Findings: No rash.  Neurological:     Mental Status: He is alert and oriented to person, place, and time.     Cranial Nerves: No cranial nerve deficit.     Motor: No abnormal muscle tone.     Coordination: Coordination normal.     Gait: Gait normal.     Deep Tendon Reflexes: Reflexes are normal and symmetric.  Psychiatric:        Behavior: Behavior normal.        Thought Content: Thought content normal.        Judgment: Judgment normal.     Lab Results  Component Value Date   WBC 4.0 03/25/2022   HGB 14.7 03/25/2022   HCT 43.7 03/25/2022   PLT 242.0 03/25/2022   GLUCOSE 104 (H) 03/25/2022   CHOL 119 03/25/2022   TRIG 38.0 03/25/2022   HDL 63.50 03/25/2022   LDLDIRECT 117.3 01/21/2011   LDLCALC 48 03/25/2022   ALT 24 03/25/2022   AST 34 03/25/2022   NA 140 03/25/2022   K 5.5 No hemolysis seen (H) 03/25/2022   CL 101 03/25/2022   CREATININE 0.82 03/25/2022   BUN 12 03/25/2022   CO2 27 03/25/2022   TSH 4.13 03/25/2022   PSA 1.50 03/25/2022   HGBA1C 5.6 10/17/2017    [place] FNA BX THYROID 1ST LESION AFIRMA  Result Date: 07/01/2022 INDICATION: Enlarging, previously biopsied thyroid nodule in the LEFT inferior lobe. EXAM: ULTRASOUND GUIDED FINE NEEDLE ASPIRATION OF INDETERMINATE THYROID NODULE COMPARISON:  U/S thyroid on 05/10/2022. US Thyroid biopsy, 05/23/2007. MEDICATIONS: 2 ML 1 % lidocaine COMPLICATIONS: None immediate. TECHNIQUE: Informed written consent was obtained from the patient after a discussion of the risks, benefits and alternatives to treatment. Questions regarding the procedure were encouraged and answered. A timeout was performed prior to the initiation of the procedure. Pre-procedural ultrasound scanning demonstrated unchanged size and  appearance of the indeterminate nodule within the left inferior thyroid lobe. The procedure was planned. The neck was prepped in the usual sterile fashion, and a sterile drape was applied covering the operative field. A timeout was performed prior to the initiation of the procedure. Local anesthesia was provided with 1% lidocaine. Under  direct ultrasound guidance, 5 FNA biopsies were performed of the left inferior thyroid nodule with a 25 gauge needle. Two of these samples were obtained for [facility]. Multiple ultrasound images were saved for procedural documentation purposes. The samples were prepared and submitted to pathology. Limited post procedural scanning was negative for hematoma or additional complication. Dressings were placed. The patient tolerated the above procedures procedure well without immediate postprocedural complication. FINDINGS: Nodule reference number based on prior diagnostic ultrasound: 1 Maximum size: 3.5 cm Location: Left; Inferior ACR TI-RADS risk category: TR3 (3 points) Reason for biopsy: meets ACR TI-RADS criteria Ultrasound imaging confirms appropriate placement of the needles within the thyroid nodule. IMPRESSION: Successful ultrasound guided repeat FNA biopsy of a 3.5 cm LEFT inferior TR-3 thyroid nodule. Read by Lawernce Ion, IR PA Electronically Signed   By: Roanna Banning M.D.   On: 07/01/2022 07:45    Assessment & Plan:   Problem List Items Addressed This Visit     Hypothyroidism - Primary    Cont on Levothroid Chronic      Memory changes    Cont on Lion's mane, Medeterian diet        Well adult exam    We discussed age appropriate health related issues, including available/recomended screening tests and vaccinations. Labs were ordered to be later reviewed . All questions were answered. We discussed one or more of the following - seat belt use, use of sunscreen/sun exposure exercise, fall risk reduction, second hand smoke exposure, firearm use and storage, seat belt  use, a need for adhering to healthy diet and exercise. Labs were ordered.  All questions were answered. Coloduard 2018 (-)  Colon 2021 due in 2026 Dr Marina Goodell      Relevant Orders   TSH   Urinalysis   CBC with Differential/Platelet   Lipid panel   PSA   Comprehensive metabolic panel      No orders of the defined types were placed in this encounter.     Follow-up: Return in about 6 months (around 03/25/2023) for a follow-up visit.  Sonda Primes, MD

## 2022-09-22 NOTE — Assessment & Plan Note (Signed)
 Cont on Lion's mane, Medeterian diet

## 2022-09-22 NOTE — Assessment & Plan Note (Signed)
 Cont on Levothroid Chronic

## 2022-09-22 NOTE — Assessment & Plan Note (Signed)
  We discussed age appropriate health related issues, including available/recomended screening tests and vaccinations. Labs were ordered to be later reviewed . All questions were answered. We discussed one or more of the following - seat belt use, use of sunscreen/sun exposure exercise, fall risk reduction, second hand smoke exposure, firearm use and storage, seat belt use, a need for adhering to healthy diet and exercise. Labs were ordered.  All questions were answered. Coloduard 2018 (-)  Colon 2021 due in 2026 Dr Henrene Pastor

## 2022-09-28 DIAGNOSIS — I2510 Atherosclerotic heart disease of native coronary artery without angina pectoris: Secondary | ICD-10-CM | POA: Diagnosis not present

## 2022-09-28 DIAGNOSIS — M7989 Other specified soft tissue disorders: Secondary | ICD-10-CM | POA: Diagnosis not present

## 2022-09-28 DIAGNOSIS — I2584 Coronary atherosclerosis due to calcified coronary lesion: Secondary | ICD-10-CM | POA: Diagnosis not present

## 2022-09-28 DIAGNOSIS — R0602 Shortness of breath: Secondary | ICD-10-CM | POA: Diagnosis not present

## 2022-09-29 DIAGNOSIS — C44319 Basal cell carcinoma of skin of other parts of face: Secondary | ICD-10-CM | POA: Diagnosis not present

## 2022-09-29 DIAGNOSIS — Z85820 Personal history of malignant melanoma of skin: Secondary | ICD-10-CM | POA: Diagnosis not present

## 2022-09-29 DIAGNOSIS — Z85828 Personal history of other malignant neoplasm of skin: Secondary | ICD-10-CM | POA: Diagnosis not present

## 2022-10-03 ENCOUNTER — Encounter: Payer: Self-pay | Admitting: Internal Medicine

## 2022-10-04 MED ORDER — TADALAFIL 5 MG PO TABS: 5.0000 mg | ORAL_TABLET | Freq: Every day | ORAL | 1 refills | Status: DC

## 2022-10-14 ENCOUNTER — Other Ambulatory Visit: Payer: Self-pay | Admitting: Internal Medicine

## 2023-01-27 ENCOUNTER — Ambulatory Visit: Payer: Medicare PPO

## 2023-01-27 VITALS — BP 120/70 | HR 56 | Ht 69.0 in | Wt 149.8 lb

## 2023-01-27 DIAGNOSIS — Z0000 Encounter for general adult medical examination without abnormal findings: Secondary | ICD-10-CM

## 2023-01-27 NOTE — Patient Instructions (Signed)
Travis Morse , Thank you for taking time to come for your Medicare Wellness Visit. I appreciate your ongoing commitment to your health goals. Please review the following plan we discussed and let me know if I can assist you in the future.   Referrals/Orders/Follow-Ups/Clinician Recommendations: Keep up the good work.  This is a list of the screening recommended for you and due dates:  Health Maintenance  Topic Date Due   COVID-19 Vaccine (7 - 2023-24 season) 03/15/2023   Medicare Annual Wellness Visit  01/27/2024   DTaP/Tdap/Td vaccine (3 - Td or Tdap) 08/06/2027   Colon Cancer Screening  08/19/2029   Pneumonia Vaccine  Completed   Flu Shot  Completed   Hepatitis C Screening  Completed   Zoster (Shingles) Vaccine  Completed   HPV Vaccine  Aged Out   Cologuard (Stool DNA test)  Discontinued    Advanced directives: (Copy Requested) Please bring a copy of your health care power of attorney and living will to the office to be added to your chart at your convenience.  Next Medicare Annual Wellness Visit scheduled for next year: Yes

## 2023-01-27 NOTE — Progress Notes (Cosign Needed Addendum)
Subjective:   Travis Morse is a 72 y.o. male who presents for Medicare Annual/Subsequent preventive examination.  Visit Complete: In person  Patient Medicare AWV questionnaire was completed by the patient on 01/27/2023; I have confirmed that all information answered by patient is correct and no changes since this date.  Cardiac Risk Factors include: advanced age (>69men, >81 women);hypertension;male gender;dyslipidemia     Objective:    Today's Vitals   01/27/23 1435  Weight: 149 lb 12.8 oz (67.9 kg)  Height: 5\' 9"  (1.753 m)   Body mass index is 22.12 kg/m.     01/27/2023    2:43 PM 11/02/2021    2:54 PM 10/23/2020    2:36 PM 10/23/2016    7:49 AM 09/14/2013    8:46 AM  Advanced Directives  Does Patient Have a Medical Advance Directive? Yes Yes Yes Yes Patient has advance directive, copy not in chart  Type of Advance Directive Healthcare Power of [place];Living will Healthcare Power of [place];Living will Living will;Healthcare Power of [employer] Power of [place];Living will Healthcare Power of [place];Living will;Advance instruction for mental health treatment  Does patient want to make changes to medical advance directive?   No - Patient declined No - Patient declined   Copy of Healthcare Power of Attorney in Chart? No - copy requested No - copy requested No - copy requested Yes     Current Medications (verified) Outpatient Encounter Medications as of 01/27/2023  Medication Sig   b complex vitamins tablet Take 1 tablet by mouth daily.   BABY ASPIRIN PO Take by mouth.   Beet Root POWD Take by mouth 2 (two) times daily. Mixes 1 tbsp in 6 oz of water   Cholecalciferol (VITAMIN D) 2000 units tablet Take 2,000 Units by mouth daily.   Chromium-Cinnamon (CINNAMON PLUS CHROMIUM PO) Take by mouth.   Coenzyme Q10 (CO Q 10 PO) Take by mouth daily.   ezetimibe (ZETIA) 10 MG tablet Take 10 mg by mouth daily.   ferrous sulfate 220 (44 Fe) MG/5ML solution Take 220 mg  by mouth daily.   fish oil-omega-3 fatty acids 1000 MG capsule Take 4,000 mg by mouth 2 (two) times daily.   Garlic 1000 MG CAPS Take by mouth.   GLUCOSAMINE SULFATE PO Take 2 tablets by mouth daily.   hydrochlorothiazide (HYDRODIURIL) 12.5 MG tablet Take 12.5 mg by mouth daily.   levothyroxine (SYNTHROID) 50 MCG tablet Take 1 tablet by mouth once daily   Misc Natural Products (GINKOGIN PO) Take by mouth.   Multiple Vitamin (MULTIVITAMIN WITH MINERALS) TABS tablet Take 1 tablet by mouth daily.   PUMPKIN SEED PO Take by mouth.   Resveratrol 250 MG CAPS Take by mouth.   rosuvastatin (CRESTOR) 20 MG tablet Take 20 mg by mouth every other day.   tadalafil (CIALIS) 5 MG tablet Take 1 tablet (5 mg total) by mouth daily.   TURMERIC PO Take 1 tablet by mouth 2 (two) times daily. Less than a teaspoonful -takes with tomato paste and pepper   [DISCONTINUED] rosuvastatin (CRESTOR) 10 MG tablet Take 10 mg by mouth every other day.    No facility-administered encounter medications on file as of 01/27/2023.    Allergies (verified) Patient has no known allergies.   History: Past Medical History:  Diagnosis Date   Allergy    allergic to bee stings   Anemia    Anxiety    Cancer (HCC)    SKIN   Depression    History of kidney  stones    Hx of colonic polyps    Dr Marina Goodell   Hypothyroidism 2008   Insomnia    Memory difficulty    Multiple thyroid nodules    Past Surgical History:  Procedure Laterality Date   Arm fracture     as a child   COLONOSCOPY     COLONOSCOPY     OPEN REDUCTION INTERNAL FIXATION (ORIF) METACARPAL Right 10/23/2016   Procedure: Right thumb closed reduction and pinning;  Surgeon: Bradly Bienenstock, MD;  Location: [facility] OR;  Service: Orthopedics;  Laterality: Right;   POLYPECTOMY     THYROIDECTOMY, PARTIAL     TIBIA FRACTURE SURGERY     Family History  Problem Relation Age of Onset   Coronary artery disease Father        34's   Heart disease Father 72       MI   Thyroid  disease Neg Hx    Colon polyps Neg Hx    Colon cancer Neg Hx    Esophageal cancer Neg Hx    Rectal cancer Neg Hx    Stomach cancer Neg Hx    Social History   Socioeconomic History   Marital status: Significant Other    Spouse name: Not on file   Number of children: 0   Years of education: Not on file   Highest education level: Not on file  Occupational History   Occupation: Retired IT trainer  Tobacco Use   Smoking status: Never   Smokeless tobacco: Never  Vaping Use   Vaping status: Never Used  Substance and Sexual Activity   Alcohol use: Yes    Comment: 1 glass a wine a day    Drug use: No   Sexual activity: Yes    Partners: Female  Other Topics Concern   Not on file  Social History Narrative   Lives with girlfriend.    Social Determinants of Health   Financial Resource Strain: Low Risk  (01/27/2023)   Overall Financial Resource Strain (CARDIA)    Difficulty of Paying Living Expenses: Not hard at all  Food Insecurity: No Food Insecurity (01/27/2023)   Hunger Vital Sign    Worried About Running Out of Food in the Last Year: Never true    Ran Out of Food in the Last Year: Never true  Transportation Needs: No Transportation Needs (01/27/2023)   PRAPARE - Administrator, Civil Service (Medical): No    Lack of Transportation (Non-Medical): No  Physical Activity: Sufficiently Active (01/27/2023)   Exercise Vital Sign    Days of Exercise per Week: 4 days    Minutes of Exercise per Session: 40 min  Stress: No Stress Concern Present (01/27/2023)   [employer] of Occupational Health - Occupational Stress Questionnaire    Feeling of Stress : Not at all  Social Connections: Unknown (01/27/2023)   Social Connection and Isolation Panel [NHANES]    Frequency of Communication with Friends and Family: Never    Frequency of Social Gatherings with Friends and Family: Never    Attends Religious Services: Not on Marketing executive or Organizations: Yes     Attends Engineer, structural: More than 4 times per year    Marital Status: Living with partner    Tobacco Counseling Counseling given: Not Answered   Clinical Intake:  Pre-visit preparation completed: Yes  Pain : No/denies pain     BMI - recorded: 22.12 Nutritional Status: BMI of 19-24  Normal  Nutritional Risks: None  How often do you need to have someone help you when you read instructions, pamphlets, or other written materials from your doctor or pharmacy?: 1 - Never  Interpreter Needed?: No  Information entered by :: Debroah Shuttleworth, RMA   Activities of Daily Living    01/27/2023   11:48 AM  In your present state of health, do you have any difficulty performing the following activities:  Hearing? 0  Vision? 0  Difficulty concentrating or making decisions? 0  Walking or climbing stairs? 0  Dressing or bathing? 0  Doing errands, shopping? 0  Preparing Food and eating ? N  Using the Toilet? N  In the past six months, have you accidently leaked urine? Y  Do you have problems with loss of bowel control? N  Managing your Medications? N  Managing your Finances? N  Housekeeping or managing your Housekeeping? N    Patient Care Team: Plotnikov, Georgina Quint, MD as PCP - General Marina Goodell Wilhemina Bonito, MD as Consulting Physician (Gastroenterology) Eyvonne Left, MD as Referring Physician (Cardiology) Crista Elliot, MD as Consulting Physician (Urology) Ander Purpura, OD as Consulting Physician (Optometry)  Indicate any recent Medical Services you may have received from other than [facility] providers in the past year (date may be approximate).     Assessment:   This is a routine wellness examination for [place].  Hearing/Vision screen Hearing Screening - Comments:: Denies hearing difficulties   Vision Screening - Comments:: Wears eyeglasses   Goals Addressed               This Visit's Progress     Patient Stated (pt-stated)   On track     My goal is to live  a very active life by staying independent and physically active.      Depression Screen    01/27/2023    2:45 PM 03/25/2022    2:05 PM 11/02/2021    2:57 PM 11/02/2021    2:56 PM 11/02/2021    2:54 PM 10/23/2020    2:35 PM 10/17/2017    2:06 PM  PHQ 2/9 Scores  PHQ - 2 Score 0 0 0 0 0 0 0  PHQ- 9 Score 4          Fall Risk    01/27/2023   11:48 AM 03/25/2022    2:05 PM 11/02/2021    2:41 PM 11/25/2020    3:14 PM 10/23/2020    2:38 PM  Fall Risk   Falls in the past year? 0 0 0 0 0  Number falls in past yr: 0 0 0 0 0  Injury with Fall? 0 0 0 0 0  Risk for fall due to :  No Fall Risks No Fall Risks No Fall Risks No Fall Risks  Follow up Falls evaluation completed;Falls prevention discussed Falls evaluation completed Falls prevention discussed  Falls evaluation completed    MEDICARE RISK AT HOME: Medicare Risk at Home Any stairs in or around the home?: Yes If so, are there any without handrails?: No Home free of loose throw rugs in walkways, pet beds, electrical cords, etc?: No Adequate lighting in your home to reduce risk of falls?: Yes Life alert?: No Use of a cane, walker or w/c?: No Grab bars in the bathroom?: No Shower chair or bench in shower?: No Elevated toilet seat or a handicapped toilet?: No  TIMED UP AND GO:  Was the test performed?  Yes  Length of time to ambulate 10 feet:  10 sec Gait steady and fast without use of assistive device    Cognitive Function:        01/27/2023    2:33 PM 11/02/2021    3:03 PM  6CIT Screen  What Year? 0 points 0 points  What month? 0 points 0 points  What time? 0 points 0 points  Count back from 20 0 points 0 points  Months in reverse 0 points 0 points  Repeat phrase 0 points 0 points  Total Score 0 points 0 points    Immunizations Immunization History  Administered Date(s) Administered   Fluad Quad(high Dose 65+) 12/07/2019   Influenza Split 01/26/2011, 01/24/2012   Influenza Whole 12/19/2004, 12/28/2006    Influenza, High Dose Seasonal PF 02/20/2016, 01/10/2018, 01/17/2019, 12/03/2021   Influenza,inj,Quad PF,6+ Mos 01/17/2013, 12/14/2013, 02/19/2015   MMR 09/05/2017   PFIZER(Purple Top)SARS-COV-2 Vaccination 03/28/2019, 04/23/2019, 12/01/2019, 07/26/2020   Pfizer(Comirnaty)Fall Seasonal Vaccine 12 years and older 12/03/2021   Pneumococcal Conjugate-13 09/19/2015   Pneumococcal Polysaccharide-23 02/19/2015   Td 06/27/2007   Tdap 08/05/2017   Typhoid Live 09/06/2017   Zoster Recombinant(Shingrix) 01/10/2018, 04/27/2018   Zoster, Live 01/24/2012    TDAP status: Up to date  Flu Vaccine status: Up to date  Pneumococcal vaccine status: Up to date  Covid-19 vaccine status: Completed vaccines  Qualifies for Shingles Vaccine? Yes   Zostavax completed Yes   Shingrix Completed?: Yes  Screening Tests Health Maintenance  Topic Date Due   INFLUENZA VACCINE  10/07/2022   COVID-19 Vaccine (6 - 2023-24 season) 11/07/2022   Medicare Annual Wellness (AWV)  01/27/2024   DTaP/Tdap/Td (3 - Td or Tdap) 08/06/2027   Colonoscopy  08/19/2029   Pneumonia Vaccine 25+ Years old  Completed   Hepatitis C Screening  Completed   Zoster Vaccines- Shingrix  Completed   HPV VACCINES  Aged Out   Fecal DNA (Cologuard)  Discontinued    Health Maintenance  Health Maintenance Due  Topic Date Due   INFLUENZA VACCINE  10/07/2022   COVID-19 Vaccine (6 - 2023-24 season) 11/07/2022    Colorectal cancer screening: Type of screening: Colonoscopy. Completed 08/20/2019. Repeat every 10 years  Lung Cancer Screening: (Low Dose CT Chest recommended if Age 84-80 years, 20 pack-year currently smoking OR have quit w/in 15years.) does not qualify.   Lung Cancer Screening Referral: N/A  Additional Screening:  Hepatitis C Screening: does qualify; Completed 09/16/2015  Vision Screening: Recommended annual ophthalmology exams for early detection of glaucoma and other disorders of the eye. Is the patient up to date with  their annual eye exam?  Yes  Who is the provider or what is the name of the office in which the patient attends annual eye exams? [facility] If pt is not established with a provider, would they like to be referred to a provider to establish care? No .   Dental Screening: Recommended annual dental exams for proper oral hygiene  Community Resource Referral / Chronic Care Management: CRR required this visit?  No   CCM required this visit?  No     Plan:     I have personally reviewed and noted the following in the patient's chart:   Medical and social history Use of alcohol, tobacco or illicit drugs  Current medications and supplements including opioid prescriptions. Patient is not currently taking opioid prescriptions. Functional ability and status Nutritional status Physical activity Advanced directives List of other physicians Hospitalizations, surgeries, and ER visits in previous 12 months Vitals Screenings to include cognitive, depression,  and falls Referrals and appointments  In addition, I have reviewed and discussed with patient certain preventive protocols, quality metrics, and best practice recommendations. A written personalized care plan for preventive services as well as general preventive health recommendations were provided to patient.     Christne Platts L Girard Koontz, CMA   01/27/2023   After Visit Summary: (MyChart) Due to this being a telephonic visit, the after visit summary with patients personalized plan was offered to patient via MyChart   Nurse Notes: Patient is up to date on all his health maintenance.  He had no concerns to address today.  Medical screening examination/treatment/procedure(s) were performed by non-physician practitioner and as supervising physician I was immediately available for consultation/collaboration.  I agree with above. Jacinta Shoe, MD

## 2023-02-10 DIAGNOSIS — D225 Melanocytic nevi of trunk: Secondary | ICD-10-CM | POA: Diagnosis not present

## 2023-02-10 DIAGNOSIS — L821 Other seborrheic keratosis: Secondary | ICD-10-CM | POA: Diagnosis not present

## 2023-02-10 DIAGNOSIS — Z85820 Personal history of malignant melanoma of skin: Secondary | ICD-10-CM | POA: Diagnosis not present

## 2023-02-10 DIAGNOSIS — Z85828 Personal history of other malignant neoplasm of skin: Secondary | ICD-10-CM | POA: Diagnosis not present

## 2023-02-10 DIAGNOSIS — L570 Actinic keratosis: Secondary | ICD-10-CM | POA: Diagnosis not present

## 2023-02-10 DIAGNOSIS — L720 Epidermal cyst: Secondary | ICD-10-CM | POA: Diagnosis not present

## 2023-02-10 DIAGNOSIS — L812 Freckles: Secondary | ICD-10-CM | POA: Diagnosis not present

## 2023-03-28 ENCOUNTER — Ambulatory Visit: Payer: Medicare PPO | Admitting: Internal Medicine

## 2023-03-29 ENCOUNTER — Ambulatory Visit: Payer: Medicare PPO | Admitting: Internal Medicine

## 2023-03-29 VITALS — BP 134/70 | HR 49 | Temp 97.9°F | Ht 69.0 in | Wt 151.0 lb

## 2023-03-29 DIAGNOSIS — E785 Hyperlipidemia, unspecified: Secondary | ICD-10-CM | POA: Diagnosis not present

## 2023-03-29 DIAGNOSIS — I2583 Coronary atherosclerosis due to lipid rich plaque: Secondary | ICD-10-CM | POA: Diagnosis not present

## 2023-03-29 DIAGNOSIS — R931 Abnormal findings on diagnostic imaging of heart and coronary circulation: Secondary | ICD-10-CM

## 2023-03-29 DIAGNOSIS — E034 Atrophy of thyroid (acquired): Secondary | ICD-10-CM

## 2023-03-29 LAB — CBC WITH DIFFERENTIAL/PLATELET
Basophils Absolute: 0.1 10*3/uL (ref 0.0–0.1)
Basophils Relative: 1.9 % (ref 0.0–3.0)
Eosinophils Absolute: 0.3 10*3/uL (ref 0.0–0.7)
Eosinophils Relative: 6.2 % — ABNORMAL HIGH (ref 0.0–5.0)
HCT: 43.1 % (ref 39.0–52.0)
Hemoglobin: 14.6 g/dL (ref 13.0–17.0)
Lymphocytes Relative: 33.2 % (ref 12.0–46.0)
Lymphs Abs: 1.4 10*3/uL (ref 0.7–4.0)
MCHC: 33.8 g/dL (ref 30.0–36.0)
MCV: 97.3 fL (ref 78.0–100.0)
Monocytes Absolute: 0.4 10*3/uL (ref 0.1–1.0)
Monocytes Relative: 9.1 % (ref 3.0–12.0)
Neutro Abs: 2.1 10*3/uL (ref 1.4–7.7)
Neutrophils Relative %: 49.6 % (ref 43.0–77.0)
Platelets: 240 10*3/uL (ref 150.0–400.0)
RBC: 4.43 Mil/uL (ref 4.22–5.81)
RDW: 12.8 % (ref 11.5–15.5)
WBC: 4.2 10*3/uL (ref 4.0–10.5)

## 2023-03-29 LAB — COMPREHENSIVE METABOLIC PANEL
ALT: 22 U/L (ref 0–53)
AST: 40 U/L — ABNORMAL HIGH (ref 0–37)
Albumin: 4.5 g/dL (ref 3.5–5.2)
Alkaline Phosphatase: 59 U/L (ref 39–117)
BUN: 10 mg/dL (ref 6–23)
CO2: 32 meq/L (ref 19–32)
Calcium: 9.9 mg/dL (ref 8.4–10.5)
Chloride: 102 meq/L (ref 96–112)
Creatinine, Ser: 0.73 mg/dL (ref 0.40–1.50)
GFR: 90.44 mL/min (ref >60.00)
Glucose, Bld: 108 mg/dL — ABNORMAL HIGH (ref 70–99)
Potassium: 5.2 meq/L — ABNORMAL HIGH (ref 3.5–5.1)
Sodium: 141 meq/L (ref 135–145)
Total Bilirubin: 0.4 mg/dL (ref 0.2–1.2)
Total Protein: 7.6 g/dL (ref 6.0–8.3)

## 2023-03-29 LAB — URINALYSIS
Bilirubin Urine: NEGATIVE
Hgb urine dipstick: NEGATIVE
Ketones, ur: NEGATIVE
Leukocytes,Ua: NEGATIVE
Nitrite: NEGATIVE
Specific Gravity, Urine: 1.01 (ref 1.000–1.030)
Total Protein, Urine: NEGATIVE
Urine Glucose: NEGATIVE
Urobilinogen, UA: 0.2 (ref 0.0–1.0)
pH: 5.5 (ref 5.0–8.0)

## 2023-03-29 LAB — LIPID PANEL
Cholesterol: 149 mg/dL (ref 0–200)
HDL: 79.2 mg/dL (ref >39.00)
LDL Cholesterol: 63 mg/dL (ref 0–99)
NonHDL: 69.68
Total CHOL/HDL Ratio: 2
Triglycerides: 33 mg/dL (ref 0.0–149.0)
VLDL: 6.6 mg/dL (ref 0.0–40.0)

## 2023-03-29 LAB — PSA: PSA: 2.51 ng/mL (ref 0.10–4.00)

## 2023-03-29 LAB — TSH: TSH: 4.73 u[IU]/mL (ref 0.35–5.50)

## 2023-03-29 NOTE — Assessment & Plan Note (Signed)
 Cont on Levothroid Chronic

## 2023-03-29 NOTE — Assessment & Plan Note (Signed)
Continue with Crestor and aspirin 

## 2023-03-29 NOTE — Progress Notes (Signed)
Subjective:  Patient ID: Travis Morse, male    DOB: 1950/11/24  Age: 73 y.o. MRN: [id number]  CC: No chief complaint on file.   HPI CHET UCHIDA presents for CAD, dyslipidemia, hypothyroidism Has been skiing hard this winter   Outpatient Medications Prior to Visit  Medication Sig Dispense Refill   b complex vitamins tablet Take 1 tablet by mouth daily.     BABY ASPIRIN PO Take by mouth.     Beet Root POWD Take by mouth 2 (two) times daily. Mixes 1 tbsp in 6 oz of water     Cholecalciferol (VITAMIN D) 2000 units tablet Take 2,000 Units by mouth daily.     Chromium-Cinnamon (CINNAMON PLUS CHROMIUM PO) Take by mouth.     Coenzyme Q10 (CO Q 10 PO) Take by mouth daily.     ezetimibe (ZETIA) 10 MG tablet Take 10 mg by mouth daily.     ferrous sulfate 220 (44 Fe) MG/5ML solution Take 220 mg by mouth daily.     fish oil-omega-3 fatty acids 1000 MG capsule Take 4,000 mg by mouth 2 (two) times daily.     Garlic 1000 MG CAPS Take by mouth.     GLUCOSAMINE SULFATE PO Take 2 tablets by mouth daily.     hydrochlorothiazide (HYDRODIURIL) 12.5 MG tablet Take 12.5 mg by mouth daily.     levothyroxine (SYNTHROID) 50 MCG tablet Take 1 tablet by mouth once daily 90 tablet 3   Misc Natural Products (GINKOGIN PO) Take by mouth.     Multiple Vitamin (MULTIVITAMIN WITH MINERALS) TABS tablet Take 1 tablet by mouth daily.     PUMPKIN SEED PO Take by mouth.     Resveratrol 250 MG CAPS Take by mouth.     rosuvastatin (CRESTOR) 20 MG tablet Take 20 mg by mouth every other day.     tadalafil (CIALIS) 5 MG tablet Take 1 tablet (5 mg total) by mouth daily. 90 tablet 1   TURMERIC PO Take 1 tablet by mouth 2 (two) times daily. Less than a teaspoonful -takes with tomato paste and pepper     No facility-administered medications prior to visit.    ROS: Review of Systems  Constitutional:  Negative for appetite change, fatigue and unexpected weight change.  HENT:  Negative for congestion, nosebleeds,  sneezing, sore throat and trouble swallowing.   Eyes:  Negative for itching and visual disturbance.  Respiratory:  Negative for cough.   Cardiovascular:  Negative for chest pain, palpitations and leg swelling.  Gastrointestinal:  Negative for abdominal distention, blood in stool, diarrhea and nausea.  Genitourinary:  Negative for frequency and hematuria.  Musculoskeletal:  Negative for back pain, gait problem, joint swelling and neck pain.  Skin:  Negative for rash.  Neurological:  Negative for dizziness, tremors, speech difficulty and weakness.  Psychiatric/Behavioral:  Negative for agitation, dysphoric mood and sleep disturbance. The patient is not nervous/anxious.     Objective:  BP 134/70 (BP Location: Right Arm, Patient Position: Sitting, Cuff Size: Normal)   Pulse (!) 49   Temp 97.9 F (36.6 C) (Oral)   Ht 5\' 9"  (1.753 m)   Wt 151 lb (68.5 kg)   SpO2 98%   BMI 22.30 kg/m   BP Readings from Last 3 Encounters:  03/29/23 134/70  01/27/23 120/70  09/22/22 110/60    Wt Readings from Last 3 Encounters:  03/29/23 151 lb (68.5 kg)  01/27/23 149 lb 12.8 oz (67.9 kg)  09/22/22 154 lb (69.9 kg)  Physical Exam Constitutional:      General: He is not in acute distress.    Appearance: He is well-developed.     Comments: NAD  Eyes:     Conjunctiva/sclera: Conjunctivae normal.     Pupils: Pupils are equal, round, and reactive to light.  Neck:     Thyroid: No thyromegaly.     Vascular: No JVD.  Cardiovascular:     Rate and Rhythm: Normal rate and regular rhythm.     Heart sounds: Normal heart sounds. No murmur heard.    No friction rub. No gallop.  Pulmonary:     Effort: Pulmonary effort is normal. No respiratory distress.     Breath sounds: Normal breath sounds. No wheezing or rales.  Chest:     Chest wall: No tenderness.  Abdominal:     General: Bowel sounds are normal. There is no distension.     Palpations: Abdomen is soft. There is no mass.     Tenderness: There  is no abdominal tenderness. There is no guarding or rebound.  Musculoskeletal:        General: No tenderness. Normal range of motion.     Cervical back: Normal range of motion.  Lymphadenopathy:     Cervical: No cervical adenopathy.  Skin:    General: Skin is warm and dry.     Findings: No rash.  Neurological:     Mental Status: He is alert and oriented to person, place, and time.     Cranial Nerves: No cranial nerve deficit.     Motor: No abnormal muscle tone.     Coordination: Coordination normal.     Gait: Gait normal.     Deep Tendon Reflexes: Reflexes are normal and symmetric.  Psychiatric:        Behavior: Behavior normal.        Thought Content: Thought content normal.        Judgment: Judgment normal.     Lab Results  Component Value Date   WBC 6.2 09/22/2022   HGB 13.4 09/22/2022   HCT 40.0 09/22/2022   PLT 271.0 09/22/2022   GLUCOSE 110 (H) 09/22/2022   CHOL 131 09/22/2022   TRIG 38.0 09/22/2022   HDL 61.60 09/22/2022   LDLDIRECT 117.3 01/21/2011   LDLCALC 62 09/22/2022   ALT 22 09/22/2022   AST 32 09/22/2022   NA 139 09/22/2022   K 3.9 09/22/2022   CL 102 09/22/2022   CREATININE 0.74 09/22/2022   BUN 11 09/22/2022   CO2 27 09/22/2022   TSH 2.89 09/22/2022   PSA 1.98 09/22/2022   HGBA1C 5.6 10/17/2017    [place] FNA BX THYROID 1ST LESION AFIRMA Result Date: 07/01/2022 INDICATION: Enlarging, previously biopsied thyroid nodule in the LEFT inferior lobe. EXAM: ULTRASOUND GUIDED FINE NEEDLE ASPIRATION OF INDETERMINATE THYROID NODULE COMPARISON:  U/S thyroid on 05/10/2022. US Thyroid biopsy, 05/23/2007. MEDICATIONS: 2 ML 1 % lidocaine COMPLICATIONS: None immediate. TECHNIQUE: Informed written consent was obtained from the patient after a discussion of the risks, benefits and alternatives to treatment. Questions regarding the procedure were encouraged and answered. A timeout was performed prior to the initiation of the procedure. Pre-procedural ultrasound scanning  demonstrated unchanged size and appearance of the indeterminate nodule within the left inferior thyroid lobe. The procedure was planned. The neck was prepped in the usual sterile fashion, and a sterile drape was applied covering the operative field. A timeout was performed prior to the initiation of the procedure. Local anesthesia was provided with 1% lidocaine.  Under direct ultrasound guidance, 5 FNA biopsies were performed of the left inferior thyroid nodule with a 25 gauge needle. Two of these samples were obtained for [facility]. Multiple ultrasound images were saved for procedural documentation purposes. The samples were prepared and submitted to pathology. Limited post procedural scanning was negative for hematoma or additional complication. Dressings were placed. The patient tolerated the above procedures procedure well without immediate postprocedural complication. FINDINGS: Nodule reference number based on prior diagnostic ultrasound: 1 Maximum size: 3.5 cm Location: Left; Inferior ACR TI-RADS risk category: TR3 (3 points) Reason for biopsy: meets ACR TI-RADS criteria Ultrasound imaging confirms appropriate placement of the needles within the thyroid nodule. IMPRESSION: Successful ultrasound guided repeat FNA biopsy of a 3.5 cm LEFT inferior TR-3 thyroid nodule. Read by Lawernce Ion, IR PA Electronically Signed   By: Roanna Banning M.D.   On: 07/01/2022 07:45    Assessment & Plan:   Problem List Items Addressed This Visit     Hypothyroidism   Cont on Levothroid Chronic      Coronary atherosclerosis   On Crestor, Zetia,  ASA F/u w/cardiology      Agatston coronary artery calcium score greater than 400   Continue with Crestor and aspirin      Dyslipidemia - Primary    Cont on Crestor and Zetia         No orders of the defined types were placed in this encounter.     Follow-up: Return in about 6 months (around 09/26/2023) for Wellness Exam.  Sonda Primes, MD

## 2023-03-29 NOTE — Assessment & Plan Note (Signed)
Cont on Crestor and Zetia 

## 2023-03-29 NOTE — Assessment & Plan Note (Signed)
On Crestor, Zetia,  ASA F/u w/cardiology

## 2023-03-30 ENCOUNTER — Encounter: Payer: Self-pay | Admitting: Internal Medicine

## 2023-04-13 ENCOUNTER — Ambulatory Visit: Payer: Self-pay | Admitting: Internal Medicine

## 2023-04-13 NOTE — Telephone Encounter (Signed)
 Copied from CRM [id number]. Topic: Clinical - Medication Question >> Apr 13, 2023  8:23 AM Joanell NOVAK wrote: Reason for CRM: Andrian from [facility] called and stated that she is needing a follow up to confirm if they can switch the manufacture for medication levothyroxine  (SYNTHROID ) 50 MCG tablet.  Additional Notes: Attempted to call patient for clarification. No answer left, voicemail.

## 2023-04-15 ENCOUNTER — Telehealth: Payer: Self-pay | Admitting: Internal Medicine

## 2023-04-15 NOTE — Telephone Encounter (Signed)
 Copied from CRM [id number]. Topic: Clinical - Medication Question >> Apr 15, 2023  9:50 AM Franky GRADE wrote: Reason for CRM: Lonell from [place] [facility] is calling to get authorization to change the manufacturer on levothyroxine  (SYNTHROID ) 50 MCG tablet. Please call back to [phone].

## 2023-04-15 NOTE — Telephone Encounter (Signed)
 Spoke with Travis Morse and was able to give him the [facility] for change.

## 2023-04-17 NOTE — Telephone Encounter (Signed)
 Okay. Thank you.

## 2023-04-20 NOTE — Telephone Encounter (Signed)
Copied from CRM [id number]. Topic: Clinical - Prescription Issue >> Apr 19, 2023  4:37 PM Eunice Blase wrote: Reason for CRM: Pt returning call from last week regarding medication. Please call pt. [phone]

## 2023-04-20 NOTE — Telephone Encounter (Signed)
Spoke with the pt and was able to speak with him about his medication.

## 2023-06-29 DIAGNOSIS — I441 Atrioventricular block, second degree: Secondary | ICD-10-CM | POA: Diagnosis not present

## 2023-06-29 DIAGNOSIS — I2510 Atherosclerotic heart disease of native coronary artery without angina pectoris: Secondary | ICD-10-CM | POA: Diagnosis not present

## 2023-06-29 DIAGNOSIS — E7841 Elevated Lipoprotein(a): Secondary | ICD-10-CM | POA: Insufficient documentation

## 2023-06-29 DIAGNOSIS — E785 Hyperlipidemia, unspecified: Secondary | ICD-10-CM | POA: Diagnosis not present

## 2023-07-05 DIAGNOSIS — I10 Essential (primary) hypertension: Secondary | ICD-10-CM | POA: Diagnosis not present

## 2023-07-05 DIAGNOSIS — E041 Nontoxic single thyroid nodule: Secondary | ICD-10-CM | POA: Diagnosis not present

## 2023-07-05 DIAGNOSIS — E039 Hypothyroidism, unspecified: Secondary | ICD-10-CM | POA: Diagnosis not present

## 2023-07-05 DIAGNOSIS — E7800 Pure hypercholesterolemia, unspecified: Secondary | ICD-10-CM | POA: Diagnosis not present

## 2023-08-15 DIAGNOSIS — B353 Tinea pedis: Secondary | ICD-10-CM | POA: Diagnosis not present

## 2023-08-15 DIAGNOSIS — Z85828 Personal history of other malignant neoplasm of skin: Secondary | ICD-10-CM | POA: Diagnosis not present

## 2023-08-15 DIAGNOSIS — L821 Other seborrheic keratosis: Secondary | ICD-10-CM | POA: Diagnosis not present

## 2023-08-15 DIAGNOSIS — Z85820 Personal history of malignant melanoma of skin: Secondary | ICD-10-CM | POA: Diagnosis not present

## 2023-08-15 DIAGNOSIS — D1801 Hemangioma of skin and subcutaneous tissue: Secondary | ICD-10-CM | POA: Diagnosis not present

## 2023-09-21 ENCOUNTER — Other Ambulatory Visit: Payer: Self-pay | Admitting: Medical Genetics

## 2023-09-22 ENCOUNTER — Other Ambulatory Visit

## 2023-09-22 DIAGNOSIS — Z006 Encounter for examination for normal comparison and control in clinical research program: Secondary | ICD-10-CM

## 2023-09-26 ENCOUNTER — Ambulatory Visit: Payer: Medicare PPO | Admitting: Internal Medicine

## 2023-09-26 VITALS — BP 108/70 | HR 56 | Temp 98.1°F | Ht 69.0 in | Wt 154.0 lb

## 2023-09-26 DIAGNOSIS — I10 Essential (primary) hypertension: Secondary | ICD-10-CM | POA: Diagnosis not present

## 2023-09-26 DIAGNOSIS — E7800 Pure hypercholesterolemia, unspecified: Secondary | ICD-10-CM | POA: Insufficient documentation

## 2023-09-26 DIAGNOSIS — E785 Hyperlipidemia, unspecified: Secondary | ICD-10-CM

## 2023-09-26 DIAGNOSIS — N529 Male erectile dysfunction, unspecified: Secondary | ICD-10-CM

## 2023-09-26 DIAGNOSIS — E034 Atrophy of thyroid (acquired): Secondary | ICD-10-CM | POA: Diagnosis not present

## 2023-09-26 LAB — COMPREHENSIVE METABOLIC PANEL WITH GFR
ALT: 20 U/L (ref 0–53)
AST: 28 U/L (ref 0–37)
Albumin: 4.2 g/dL (ref 3.5–5.2)
Alkaline Phosphatase: 59 U/L (ref 39–117)
BUN: 9 mg/dL (ref 6–23)
CO2: 29 meq/L (ref 19–32)
Calcium: 9.7 mg/dL (ref 8.4–10.5)
Chloride: 101 meq/L (ref 96–112)
Creatinine, Ser: 0.7 mg/dL (ref 0.40–1.50)
GFR: 91.27 mL/min (ref >60.00)
Glucose, Bld: 108 mg/dL — ABNORMAL HIGH (ref 70–99)
Potassium: 3.7 meq/L (ref 3.5–5.1)
Sodium: 139 meq/L (ref 135–145)
Total Bilirubin: 0.3 mg/dL (ref 0.2–1.2)
Total Protein: 7.3 g/dL (ref 6.0–8.3)

## 2023-09-26 LAB — LIPID PANEL
Cholesterol: 135 mg/dL (ref 0–200)
HDL: 64.9 mg/dL (ref >39.00)
LDL Cholesterol: 62 mg/dL (ref 0–99)
NonHDL: 70.49
Total CHOL/HDL Ratio: 2
Triglycerides: 40 mg/dL (ref 0.0–149.0)
VLDL: 8 mg/dL (ref 0.0–40.0)

## 2023-09-26 LAB — TSH: TSH: 2.95 u[IU]/mL (ref 0.35–5.50)

## 2023-09-26 MED ORDER — TADALAFIL 5 MG PO TABS: 5.0000 mg | ORAL_TABLET | Freq: Every day | ORAL | 3 refills | Status: AC

## 2023-09-26 NOTE — Assessment & Plan Note (Signed)
On NAS diet  

## 2023-09-26 NOTE — Assessment & Plan Note (Addendum)
 Cont on Levothroid Chronic Monitor TSH

## 2023-09-26 NOTE — Assessment & Plan Note (Addendum)
 On Cialis  20  mg prn  F/u w/Dr Carolee - Urology

## 2023-09-26 NOTE — Assessment & Plan Note (Signed)
Cont on Crestor and Zetia 

## 2023-09-26 NOTE — Progress Notes (Signed)
 Subjective:  Patient ID: Travis Morse, male    DOB: [birth year]  Age: 73 y.o. MRN: [id number]  CC: Medical Management of Chronic Issues (6 mnth f/u )   HPI LOUIE FLENNER presents for a well exam F/u ED, HTN, hypothyroidism   Outpatient Medications Prior to Visit  Medication Sig Dispense Refill   b complex vitamins tablet Take 1 tablet by mouth daily.     BABY ASPIRIN  PO Take by mouth.     Beet Root POWD Take by mouth 2 (two) times daily. Mixes 1 tbsp in 6 oz of water     Cholecalciferol (VITAMIN D) 2000 units tablet Take 2,000 Units by mouth daily.     Chromium-Cinnamon (CINNAMON PLUS CHROMIUM PO) Take by mouth.     Coenzyme Q10 (CO Q 10 PO) Take by mouth daily.     ezetimibe (ZETIA) 10 MG tablet Take 10 mg by mouth daily.     ferrous sulfate 220 (44 Fe) MG/5ML solution Take 220 mg by mouth daily.     fish oil-omega-3 fatty acids 1000 MG capsule Take 4,000 mg by mouth 2 (two) times daily.     Garlic 1000 MG CAPS Take by mouth.     GLUCOSAMINE SULFATE PO Take 2 tablets by mouth daily.     hydrochlorothiazide (HYDRODIURIL) 12.5 MG tablet Take 12.5 mg by mouth daily.     levothyroxine  (SYNTHROID ) 50 MCG tablet Take 1 tablet by mouth once daily 90 tablet 3   Misc Natural Products (GINKOGIN PO) Take by mouth.     Multiple Vitamin (MULTIVITAMIN WITH MINERALS) TABS tablet Take 1 tablet by mouth daily.     PUMPKIN SEED PO Take by mouth.     Resveratrol 250 MG CAPS Take by mouth.     rosuvastatin (CRESTOR) 20 MG tablet Take 20 mg by mouth every other day.     TURMERIC PO Take 1 tablet by mouth 2 (two) times daily. Less than a teaspoonful -takes with tomato paste and pepper     tadalafil  (CIALIS ) 5 MG tablet Take 1 tablet (5 mg total) by mouth daily. 90 tablet 1   No facility-administered medications prior to visit.    ROS: Review of Systems  Constitutional:  Negative for appetite change, fatigue and unexpected weight change.  HENT:  Negative for congestion, nosebleeds,  sneezing, sore throat and trouble swallowing.   Eyes:  Negative for itching and visual disturbance.  Respiratory:  Negative for cough.   Cardiovascular:  Negative for chest pain, palpitations and leg swelling.  Gastrointestinal:  Negative for abdominal distention, blood in stool, diarrhea and nausea.  Genitourinary:  Negative for frequency and hematuria.  Musculoskeletal:  Positive for arthralgias. Negative for back pain, gait problem, joint swelling and neck pain.  Skin:  Negative for rash.  Neurological:  Negative for dizziness, tremors, speech difficulty and weakness.  Psychiatric/Behavioral:  Negative for agitation, dysphoric mood and sleep disturbance. The patient is not nervous/anxious.     Objective:  BP 108/70   Pulse (!) 56   Temp 98.1 F (36.7 C) (Oral)   Ht 5' 9 (1.753 m)   Wt 154 lb (69.9 kg)   SpO2 97%   BMI 22.74 kg/m   BP Readings from Last 3 Encounters:  09/26/23 108/70  03/29/23 134/70  01/27/23 120/70    Wt Readings from Last 3 Encounters:  09/26/23 154 lb (69.9 kg)  03/29/23 151 lb (68.5 kg)  01/27/23 149 lb 12.8 oz (67.9 kg)    Physical Exam  Constitutional:      General: He is not in acute distress.    Appearance: He is well-developed.     Comments: NAD  Eyes:     Conjunctiva/sclera: Conjunctivae normal.     Pupils: Pupils are equal, round, and reactive to light.  Neck:     Thyroid : No thyromegaly.     Vascular: No JVD.  Cardiovascular:     Rate and Rhythm: Normal rate and regular rhythm.     Heart sounds: Normal heart sounds. No murmur heard.    No friction rub. No gallop.  Pulmonary:     Effort: Pulmonary effort is normal. No respiratory distress.     Breath sounds: Normal breath sounds. No wheezing or rales.  Chest:     Chest wall: No tenderness.  Abdominal:     General: Bowel sounds are normal. There is no distension.     Palpations: Abdomen is soft. There is no mass.     Tenderness: There is no abdominal tenderness. There is no  guarding or rebound.  Musculoskeletal:        General: Tenderness present. Normal range of motion.     Cervical back: Normal range of motion.  Lymphadenopathy:     Cervical: No cervical adenopathy.  Skin:    General: Skin is warm and dry.     Findings: No rash.  Neurological:     Mental Status: He is alert and oriented to person, place, and time.     Cranial Nerves: No cranial nerve deficit.     Motor: No abnormal muscle tone.     Coordination: Coordination normal.     Gait: Gait normal.     Deep Tendon Reflexes: Reflexes are normal and symmetric.  Psychiatric:        Behavior: Behavior normal.        Thought Content: Thought content normal.        Judgment: Judgment normal.     Lab Results  Component Value Date   WBC 4.2 03/29/2023   HGB 14.6 03/29/2023   HCT 43.1 03/29/2023   PLT 240.0 03/29/2023   GLUCOSE 108 (H) 03/29/2023   CHOL 149 03/29/2023   TRIG 33.0 03/29/2023   HDL 79.20 03/29/2023   LDLDIRECT 117.3 01/21/2011   LDLCALC 63 03/29/2023   ALT 22 03/29/2023   AST 40 (H) 03/29/2023   NA 141 03/29/2023   K 5.2 No hemolysis seen (H) 03/29/2023   CL 102 03/29/2023   CREATININE 0.73 03/29/2023   BUN 10 03/29/2023   CO2 32 03/29/2023   TSH 4.73 03/29/2023   PSA 2.51 03/29/2023   HGBA1C 5.6 10/17/2017    US  FNA BX THYROID  1ST LESION AFIRMA Result Date: 07/01/2022 INDICATION: Enlarging, previously biopsied thyroid  nodule in the LEFT inferior lobe. EXAM: ULTRASOUND GUIDED FINE NEEDLE ASPIRATION OF INDETERMINATE THYROID  NODULE COMPARISON:  U/S thyroid  on 05/10/2022. US  Thyroid  biopsy, 05/23/2007. MEDICATIONS: 2 ML 1 % lidocaine  COMPLICATIONS: None immediate. TECHNIQUE: Informed written consent was obtained from the patient after a discussion of the risks, benefits and alternatives to treatment. Questions regarding the procedure were encouraged and answered. A timeout was performed prior to the initiation of the procedure. Pre-procedural ultrasound scanning demonstrated  unchanged size and appearance of the indeterminate nodule within the left inferior thyroid  lobe. The procedure was planned. The neck was prepped in the usual sterile fashion, and a sterile drape was applied covering the operative field. A timeout was performed prior to the initiation of the procedure. Local anesthesia was provided  with 1% lidocaine . Under direct ultrasound guidance, 5 FNA biopsies were performed of the left inferior thyroid  nodule with a 25 gauge needle. Two of these samples were obtained for AFIRMA. Multiple ultrasound images were saved for procedural documentation purposes. The samples were prepared and submitted to pathology. Limited post procedural scanning was negative for hematoma or additional complication. Dressings were placed. The patient tolerated the above procedures procedure well without immediate postprocedural complication. FINDINGS: Nodule reference number based on prior diagnostic ultrasound: 1 Maximum size: 3.5 cm Location: Left; Inferior ACR TI-RADS risk category: TR3 (3 points) Reason for biopsy: meets ACR TI-RADS criteria Ultrasound imaging confirms appropriate placement of the needles within the thyroid  nodule. IMPRESSION: Successful ultrasound guided repeat FNA biopsy of a 3.5 cm LEFT inferior TR-3 thyroid  nodule. Read by Toya Cousin, IR PA Electronically Signed   By: Thom Hall M.D.   On: 07/01/2022 07:45    Assessment & Plan:   Problem List Items Addressed This Visit     Dyslipidemia    Cont on Crestor and Zetia      Relevant Orders   TSH   Comprehensive metabolic panel with GFR   Lipid panel   Erectile dysfunction   On Cialis  20  mg prn  F/u w/Dr Carolee - Urology      Essential (primary) hypertension   On NAS diet      Relevant Medications   tadalafil  (CIALIS ) 5 MG tablet   Hypothyroidism - Primary   Cont on Levothroid Chronic Monitor TSH      Relevant Orders   TSH   Comprehensive metabolic panel with GFR   Lipid panel      Meds ordered  this encounter  Medications   tadalafil  (CIALIS ) 5 MG tablet    Sig: Take 1 tablet (5 mg total) by mouth daily.    Dispense:  90 tablet    Refill:  3      Follow-up: Return in about 6 months (around 03/28/2024) for Wellness Exam.  Marolyn Noel, MD

## 2023-09-27 ENCOUNTER — Ambulatory Visit: Payer: Self-pay | Admitting: Internal Medicine

## 2023-10-03 LAB — GENECONNECT MOLECULAR SCREEN: Genetic Analysis Overall Interpretation: NEGATIVE

## 2023-10-11 ENCOUNTER — Other Ambulatory Visit: Payer: Self-pay | Admitting: Internal Medicine

## 2023-12-29 DIAGNOSIS — E7841 Elevated Lipoprotein(a): Secondary | ICD-10-CM | POA: Diagnosis not present

## 2023-12-29 DIAGNOSIS — I2510 Atherosclerotic heart disease of native coronary artery without angina pectoris: Secondary | ICD-10-CM | POA: Diagnosis not present

## 2023-12-29 DIAGNOSIS — R000 Tachycardia, unspecified: Secondary | ICD-10-CM | POA: Diagnosis not present

## 2023-12-29 DIAGNOSIS — I441 Atrioventricular block, second degree: Secondary | ICD-10-CM | POA: Diagnosis not present

## 2024-01-08 ENCOUNTER — Other Ambulatory Visit: Payer: Self-pay | Admitting: Internal Medicine

## 2024-01-10 DIAGNOSIS — I441 Atrioventricular block, second degree: Secondary | ICD-10-CM | POA: Diagnosis not present

## 2024-01-10 DIAGNOSIS — R000 Tachycardia, unspecified: Secondary | ICD-10-CM | POA: Diagnosis not present

## 2024-01-24 DIAGNOSIS — I441 Atrioventricular block, second degree: Secondary | ICD-10-CM | POA: Diagnosis not present

## 2024-01-24 DIAGNOSIS — R000 Tachycardia, unspecified: Secondary | ICD-10-CM | POA: Diagnosis not present

## 2024-01-30 ENCOUNTER — Ambulatory Visit: Payer: Medicare PPO

## 2024-01-30 ENCOUNTER — Ambulatory Visit: Admitting: Emergency Medicine

## 2024-01-30 ENCOUNTER — Encounter: Payer: Self-pay | Admitting: Emergency Medicine

## 2024-01-30 VITALS — Ht 68.5 in

## 2024-01-30 VITALS — BP 120/78 | HR 64 | Ht 68.5 in | Wt 156.6 lb

## 2024-01-30 DIAGNOSIS — Z1211 Encounter for screening for malignant neoplasm of colon: Secondary | ICD-10-CM

## 2024-01-30 DIAGNOSIS — Z0000 Encounter for general adult medical examination without abnormal findings: Secondary | ICD-10-CM | POA: Diagnosis not present

## 2024-01-30 DIAGNOSIS — M5412 Radiculopathy, cervical region: Secondary | ICD-10-CM | POA: Insufficient documentation

## 2024-01-30 NOTE — Patient Instructions (Signed)
 Cervical Radiculopathy  Cervical radiculopathy means that a nerve in the neck (a cervical nerve) is pinched or bruised. This can happen because of an injury to the cervical spine (vertebrae) in the neck, or as a normal part of getting older. This condition can cause pain or loss of feeling (numbness) that runs from your neck all the way down to your arm and fingers. Often, this condition gets better with rest. Treatment may be needed if the condition does not get better. What are the causes? A neck injury. A bulging disk in your spine. Sudden muscle tightening (muscle spasms). Tight muscles in your neck due to overuse. Arthritis. Breakdown in the bones and joints of the spine (spondylosis) due to getting older. Bone spurs that form near the nerves in the neck. What are the signs or symptoms? Pain. The pain may: Run from the neck to the arm and hand. Be very bad or irritating. Get worse when you move your neck. Loss of feeling or tingling in your arm or hand. Weakness in your arm or hand, in very bad cases. How is this treated? In many cases, treatment is not needed for this condition. With rest, the condition often gets better over time. If treatment is needed, options may include: Wearing a soft neck collar (cervical collar) for short periods of time. Doing exercises (physical therapy) to strengthen your neck muscles. Taking medicines. Having shots (injections) in your spine, in very bad cases. Having surgery. This may be needed if other treatments do not help. The type of surgery that is used will depend on the cause of your condition. Follow these instructions at home: If you have a soft neck collar: Wear it as told by your doctor. Take it off only as told by your doctor. Ask your doctor if you can take the collar off for cleaning and bathing. If you are allowed to take the collar off for cleaning or bathing: Follow instructions from your doctor about how to take off the collar  safely. Clean the collar by wiping it with mild soap and water and drying it completely. Take out any removable pads in the collar every 1-2 days. Wash them by hand with soap and water. Let them air-dry completely before you put them back in the collar. Check your skin under the collar for redness or sores. If you see any, tell your doctor. Managing pain     Take over-the-counter and prescription medicines only as told by your doctor. If told, put ice on the painful area. To do this: If you have a soft neck collar, take if off as told by your doctor. Put ice in a plastic bag. Place a towel between your skin and the bag. Leave the ice on for 20 minutes, 2-3 times a day. Take off the ice if your skin turns bright red. This is very important. If you cannot feel pain, heat, or cold, you have a greater risk of damage to the area. If using ice does not help, you can try using heat. Use the heat source that your doctor recommends, such as a moist heat pack or a heating pad. Place a towel between your skin and the heat source. Leave the heat on for 20-30 minutes. Take off the heat if your skin turns bright red. This is very important. If you cannot feel pain, heat, or cold, you have a greater risk of getting burned. You may try a gentle neck and shoulder rub (massage). Activity Rest as needed. Return  to your normal activities when your doctor says that it is safe. Do exercises as told by your doctor or physical therapist. You may have to avoid lifting. Ask your doctor how much you can safely lift. General instructions Use a flat pillow when you sleep. Do not drive while wearing a soft neck collar. If you do not have a soft neck collar, ask your doctor if it is safe to drive while your neck heals. Ask your doctor if you should avoid driving or using machines while you are taking your medicine. Do not smoke or use any products that contain nicotine or tobacco. If you need help quitting, ask your  doctor. Keep all follow-up visits. Contact a doctor if: Your condition does not get better with treatment. Get help right away if: Your pain gets worse and medicine does not help. You lose feeling or feel weak in your hand, arm, face, or leg. You have a high fever. Your neck is stiff. You cannot control when you poop or pee (have incontinence). You have trouble with walking, balance, or talking. Summary Cervical radiculopathy means that a nerve in the neck is pinched or bruised. A nerve can get pinched from a bulging disk, arthritis, an injury to the neck, or other causes. Symptoms include pain, tingling, or loss of feeling that goes from the neck to the arm or hand. Weakness in your arm or hand can happen in very bad cases. Treatment may include resting, wearing a soft neck collar, and doing exercises. You might need to take medicines for pain. In very bad cases, shots or surgery may be needed. This information is not intended to replace advice given to you by your health care provider. Make sure you discuss any questions you have with your health care provider. Document Revised: 08/28/2020 Document Reviewed: 08/28/2020 Elsevier Patient Education  2024 [employer].

## 2024-01-30 NOTE — Assessment & Plan Note (Signed)
 Clinically stable.  No red flag signs or symptoms. Chronic problem affecting quality of life. Needs diagnostic workup. Recommend cervical spine MRI Possible need for neurosurgical evaluation depending on MRI report Symptom management discussed Recommend follow-up with PCP as well.

## 2024-01-30 NOTE — Progress Notes (Signed)
 Travis Morse 73 y.o.   Chief Complaint  Patient presents with   Numbness    In the arm this started in the mid summer and has been taking longer than usual to get back to normal and patient states that his arm has been tingling feels like it is falling asleep     HISTORY OF PRESENT ILLNESS: Acute problem visit today. This is a 73 y.o. male complaining of intermittent numbness and tingling to his right arm for the past several months Denies injury.  No other associated symptoms. No other complaints or medical concerns today.  HPI   Prior to Admission medications   Medication Sig Start Date End Date Taking? Authorizing Provider  b complex vitamins tablet Take 1 tablet by mouth daily.   Yes [provider]  BABY ASPIRIN  PO Take by mouth.   Yes [provider]  Beet Root POWD Take by mouth 2 (two) times daily. Mixes 1 tbsp in 6 oz of water   Yes [provider]  Cholecalciferol (VITAMIN D) 2000 units tablet Take 2,000 Units by mouth daily.   Yes [provider]  Chromium-Cinnamon (CINNAMON PLUS CHROMIUM PO) Take by mouth.   Yes [provider]  Coenzyme Q10 (CO Q 10 PO) Take by mouth daily.   Yes [provider]  CREATINE PO Take 1 mg by mouth 1 day or 1 dose.   Yes [provider]  ezetimibe (ZETIA) 10 MG tablet Take 10 mg by mouth daily. 07/20/21  Yes [provider]  ferrous sulfate 220 (44 Fe) MG/5ML solution Take 220 mg by mouth daily.   Yes [provider]  fish oil-omega-3 fatty acids 1000 MG capsule Take 4,000 mg by mouth 2 (two) times daily.   Yes [provider]  Garlic 1000 MG CAPS Take by mouth.   Yes [provider]  GLUCOSAMINE SULFATE PO Take 2 tablets by mouth daily.   Yes [provider]  hydrochlorothiazide (HYDRODIURIL) 12.5 MG tablet Take 12.5 mg by mouth daily.   Yes [provider]  levothyroxine  (SYNTHROID ) 50 MCG tablet Take 1 tablet by mouth  once daily 01/09/24  Yes Plotnikov, Aleksei V, MD  Misc Natural Products (GINKOGIN PO) Take by mouth.   Yes [provider]  Multiple Vitamin (MULTIVITAMIN WITH MINERALS) TABS tablet Take 1 tablet by mouth daily.   Yes [provider]  PUMPKIN SEED PO Take by mouth.   Yes [provider]  Resveratrol 250 MG CAPS Take by mouth.   Yes [provider]  rosuvastatin (CRESTOR) 20 MG tablet Take 20 mg by mouth every other day. 11/08/22  Yes [provider]  tadalafil  (CIALIS ) 5 MG tablet Take 1 tablet (5 mg total) by mouth daily. 09/26/23  Yes Plotnikov, Aleksei V, MD  TURMERIC PO Take 1 tablet by mouth 2 (two) times daily. Less than a teaspoonful -takes with tomato paste and pepper   Yes [provider]    No Known Allergies  Patient Active Problem List   Diagnosis Date Noted   Cervical radiculopathy 01/30/2024   Hypercholesterolemia 09/26/2023   Elevated lipoprotein A level 06/29/2023   Wenckebach 06/29/2023   Essential (primary) hypertension 09/22/2022   Bilateral carotid bruits 05/26/2020   Dyslipidemia 05/26/2020   Pressure ulcer 11/22/2019   Epigastric discomfort 04/27/2019   Hematochezia 04/27/2019   Swelling, cheek 11/14/2018   Agatston coronary artery calcium score greater than 400 09/21/2018   Coronary atherosclerosis 09/14/2018   Chronic sinusitis 09/14/2018  Cough 05/17/2018   Toe abscess, left 10/17/2017   Moody 08/30/2017   Neoplasm of uncertain behavior of skin 04/25/2017   Thumb injury, right, initial encounter 10/19/2016   Thumb pain, left 10/14/2016   Acute upper respiratory infection 03/22/2016   Paresthesia 08/27/2014   Dark urine 12/14/2013   Memory changes 09/14/2013   Closed fracture of right tibial plateau 03/15/2013   Posterior tibial tendinitis of left leg 06/22/2012   Arthralgia 07/26/2011   Well adult exam 01/26/2011   Erectile dysfunction 06/27/2007   Edema 06/27/2007   History of colonic polyps  06/27/2007   Thyroid  nodule 03/01/2007   Hypothyroidism 03/01/2007    Past Medical History:  Diagnosis Date   Allergy    allergic to bee stings   Anemia    Anxiety    Cancer (HCC)    SKIN   Depression    History of kidney stones    Hx of colonic polyps    Dr Abran   Hypothyroidism 2008   Insomnia    Memory difficulty    Multiple thyroid  nodules     Past Surgical History:  Procedure Laterality Date   Arm fracture     as a child   COLONOSCOPY     COLONOSCOPY     OPEN REDUCTION INTERNAL FIXATION (ORIF) METACARPAL Right 10/23/2016   Procedure: Right thumb closed reduction and pinning;  Surgeon: Shari Easter, MD;  Location: [facility] OR;  Service: Orthopedics;  Laterality: Right;   POLYPECTOMY     THYROIDECTOMY, PARTIAL     TIBIA FRACTURE SURGERY      Social History   Socioeconomic History   Marital status: Significant Other    Spouse name: Not on file   Number of children: 0   Years of education: Not on file   Highest education level: Master's degree (e.g., MA, MS, MEng, MEd, MSW, MBA)  Occupational History   Occupation: Retired IT TRAINER  Tobacco Use   Smoking status: Never   Smokeless tobacco: Never  Vaping Use   Vaping status: Never Used  Substance and Sexual Activity   Alcohol use: Yes    Alcohol/week: 1.0 standard drink of alcohol    Types: 1 Glasses of wine per week    Comment: 1 glass a wine a day    Drug use: No   Sexual activity: Yes    Partners: Female  Other Topics Concern   Not on file  Social History Narrative   Lives with girlfriend.    Social Drivers of Corporate Investment Banker Strain: Low Risk  (09/26/2023)   Overall Financial Resource Strain (CARDIA)    Difficulty of Paying Living Expenses: Not hard at all  Food Insecurity: No Food Insecurity (01/30/2024)   Hunger Vital Sign    Worried About Running Out of Food in the Last Year: Never true    Ran Out of Food in the Last Year: Never true  Transportation Needs: No Transportation Needs  (01/30/2024)   PRAPARE - Administrator, Civil Service (Medical): No    Lack of Transportation (Non-Medical): No  Physical Activity: Insufficiently Active (01/30/2024)   Exercise Vital Sign    Days of Exercise per Week: 4 days    Minutes of Exercise per Session: 30 min  Stress: No Stress Concern Present (01/30/2024)   [employer] of Occupational Health - Occupational Stress Questionnaire    Feeling of Stress: Not at all  Social Connections: Moderately Isolated (01/30/2024)   Social Connection and Isolation Panel  Frequency of Communication with Friends and Family: Once a week    Frequency of Social Gatherings with Friends and Family: Once a week    Attends Religious Services: Never    Database Administrator or Organizations: Yes    Attends Engineer, Structural: More than 4 times per year    Marital Status: Living with partner  Intimate Partner Violence: Not At Risk (01/30/2024)   Humiliation, Afraid, Rape, and Kick questionnaire    Fear of Current or Ex-Partner: No    Emotionally Abused: No    Physically Abused: No    Sexually Abused: No    Family History  Problem Relation Age of Onset   Coronary artery disease Father        54's   Heart disease Father 46       MI   Thyroid  disease Neg Hx    Colon polyps Neg Hx    Colon cancer Neg Hx    Esophageal cancer Neg Hx    Rectal cancer Neg Hx    Stomach cancer Neg Hx      Review of Systems  Constitutional: Negative.  Negative for chills and fever.  HENT: Negative.  Negative for congestion and sore throat.   Respiratory: Negative.  Negative for cough and shortness of breath.   Cardiovascular: Negative.  Negative for chest pain and palpitations.  Gastrointestinal:  Negative for abdominal pain, diarrhea, nausea and vomiting.  Genitourinary: Negative.  Negative for dysuria and hematuria.  Skin: Negative.  Negative for rash.  Neurological:  Positive for sensory change.  All other systems reviewed and  are negative.   Today's Vitals   01/30/24 1345  TempSrc: Oral  Height: 5' 8.5 (1.74 m)   Body mass index is 23.46 kg/m.   Physical Exam Vitals reviewed.  Constitutional:      Appearance: Normal appearance.  HENT:     Head: Normocephalic.  Eyes:     Extraocular Movements: Extraocular movements intact.  Cardiovascular:     Rate and Rhythm: Normal rate.  Pulmonary:     Effort: Pulmonary effort is normal.  Skin:    General: Skin is warm and dry.  Neurological:     Mental Status: He is alert and oriented to person, place, and time.     Sensory: No sensory deficit.     Motor: No weakness.     Coordination: Coordination normal.     Gait: Gait normal.  Psychiatric:        Mood and Affect: Mood normal.        Behavior: Behavior normal.      ASSESSMENT & PLAN: I personally spent a total of 31 mknutes minutes in the care of the patient today including preparing to see the patient, getting/reviewing separately obtained history, performing a medically appropriate exam/evaluation, counseling and educating, placing orders, documenting clinical information in the EHR, coordinating care, and prognosis, need for diagnostic imaging and follow-up with PCP.  Problem List Items Addressed This Visit       Nervous and Auditory   Cervical radiculopathy - Primary   Clinically stable.  No red flag signs or symptoms. Chronic problem affecting quality of life. Needs diagnostic workup. Recommend cervical spine MRI Possible need for neurosurgical evaluation depending on MRI report Symptom management discussed Recommend follow-up with PCP as well.      Relevant Orders   MR Cervical Spine Wo Contrast   Patient Instructions  Cervical Radiculopathy  Cervical radiculopathy means that a nerve in the neck (a  cervical nerve) is pinched or bruised. This can happen because of an injury to the cervical spine (vertebrae) in the neck, or as a normal part of getting older. This condition can cause  pain or loss of feeling (numbness) that runs from your neck all the way down to your arm and fingers. Often, this condition gets better with rest. Treatment may be needed if the condition does not get better. What are the causes? A neck injury. A bulging disk in your spine. Sudden muscle tightening (muscle spasms). Tight muscles in your neck due to overuse. Arthritis. Breakdown in the bones and joints of the spine (spondylosis) due to getting older. Bone spurs that form near the nerves in the neck. What are the signs or symptoms? Pain. The pain may: Run from the neck to the arm and hand. Be very bad or irritating. Get worse when you move your neck. Loss of feeling or tingling in your arm or hand. Weakness in your arm or hand, in very bad cases. How is this treated? In many cases, treatment is not needed for this condition. With rest, the condition often gets better over time. If treatment is needed, options may include: Wearing a soft neck collar (cervical collar) for short periods of time. Doing exercises (physical therapy) to strengthen your neck muscles. Taking medicines. Having shots (injections) in your spine, in very bad cases. Having surgery. This may be needed if other treatments do not help. The type of surgery that is used will depend on the cause of your condition. Follow these instructions at home: If you have a soft neck collar: Wear it as told by your doctor. Take it off only as told by your doctor. Ask your doctor if you can take the collar off for cleaning and bathing. If you are allowed to take the collar off for cleaning or bathing: Follow instructions from your doctor about how to take off the collar safely. Clean the collar by wiping it with mild soap and water and drying it completely. Take out any removable pads in the collar every 1-2 days. Wash them by hand with soap and water. Let them air-dry completely before you put them back in the collar. Check your skin  under the collar for redness or sores. If you see any, tell your doctor. Managing pain     Take over-the-counter and prescription medicines only as told by your doctor. If told, put ice on the painful area. To do this: If you have a soft neck collar, take if off as told by your doctor. Put ice in a plastic bag. Place a towel between your skin and the bag. Leave the ice on for 20 minutes, 2-3 times a day. Take off the ice if your skin turns bright red. This is very important. If you cannot feel pain, heat, or cold, you have a greater risk of damage to the area. If using ice does not help, you can try using heat. Use the heat source that your doctor recommends, such as a moist heat pack or a heating pad. Place a towel between your skin and the heat source. Leave the heat on for 20-30 minutes. Take off the heat if your skin turns bright red. This is very important. If you cannot feel pain, heat, or cold, you have a greater risk of getting burned. You may try a gentle neck and shoulder rub (massage). Activity Rest as needed. Return to your normal activities when your doctor says that it is safe.  Do exercises as told by your doctor or physical therapist. You may have to avoid lifting. Ask your doctor how much you can safely lift. General instructions Use a flat pillow when you sleep. Do not drive while wearing a soft neck collar. If you do not have a soft neck collar, ask your doctor if it is safe to drive while your neck heals. Ask your doctor if you should avoid driving or using machines while you are taking your medicine. Do not smoke or use any products that contain nicotine or tobacco. If you need help quitting, ask your doctor. Keep all follow-up visits. Contact a doctor if: Your condition does not get better with treatment. Get help right away if: Your pain gets worse and medicine does not help. You lose feeling or feel weak in your hand, arm, face, or leg. You have a high  fever. Your neck is stiff. You cannot control when you poop or pee (have incontinence). You have trouble with walking, balance, or talking. Summary Cervical radiculopathy means that a nerve in the neck is pinched or bruised. A nerve can get pinched from a bulging disk, arthritis, an injury to the neck, or other causes. Symptoms include pain, tingling, or loss of feeling that goes from the neck to the arm or hand. Weakness in your arm or hand can happen in very bad cases. Treatment may include resting, wearing a soft neck collar, and doing exercises. You might need to take medicines for pain. In very bad cases, shots or surgery may be needed. This information is not intended to replace advice given to you by your health care provider. Make sure you discuss any questions you have with your health care provider. Document Revised: 08/28/2020 Document Reviewed: 08/28/2020 Elsevier Patient Education  2024 [employer]    Emil Schaumann, MD [facility]

## 2024-01-30 NOTE — Progress Notes (Cosign Needed Addendum)
 Chief Complaint  Patient presents with   Medicare Wellness     Subjective:   Travis Morse is a 74 y.o. male who presents for a Medicare Annual Wellness Visit.  Allergies (verified) Patient has no known allergies.   History: Past Medical History:  Diagnosis Date   Allergy    allergic to bee stings   Anemia    Anxiety    Cancer (HCC)    SKIN   Depression    History of kidney stones    Hx of colonic polyps    Dr Abran   Hypothyroidism 2008   Insomnia    Memory difficulty    Multiple thyroid  nodules    Past Surgical History:  Procedure Laterality Date   Arm fracture     as a child   COLONOSCOPY     COLONOSCOPY     OPEN REDUCTION INTERNAL FIXATION (ORIF) METACARPAL Right 10/23/2016   Procedure: Right thumb closed reduction and pinning;  Surgeon: Shari Easter, MD;  Location: [facility] OR;  Service: Orthopedics;  Laterality: Right;   POLYPECTOMY     THYROIDECTOMY, PARTIAL     TIBIA FRACTURE SURGERY     Family History  Problem Relation Age of Onset   Coronary artery disease Father        56's   Heart disease Father 58       MI   Thyroid  disease Neg Hx    Colon polyps Neg Hx    Colon cancer Neg Hx    Esophageal cancer Neg Hx    Rectal cancer Neg Hx    Stomach cancer Neg Hx    Social History   Occupational History   Occupation: Retired IT TRAINER  Tobacco Use   Smoking status: Never   Smokeless tobacco: Never  Vaping Use   Vaping status: Never Used  Substance and Sexual Activity   Alcohol use: Yes    Alcohol/week: 1.0 standard drink of alcohol    Types: 1 Glasses of wine per week    Comment: 1 glass a wine a day    Drug use: No   Sexual activity: Yes    Partners: Female   Tobacco Counseling Counseling given: Not Answered  SDOH Screenings   Food Insecurity: No Food Insecurity (01/30/2024)  Housing: Unknown (01/30/2024)  Transportation Needs: No Transportation Needs (01/30/2024)  Utilities: Not At Risk (01/30/2024)  Alcohol Screen: Low Risk  (09/26/2023)   Depression (PHQ2-9): Low Risk  (01/30/2024)  Financial Resource Strain: Low Risk  (09/26/2023)  Physical Activity: Insufficiently Active (01/30/2024)  Social Connections: Moderately Isolated (01/30/2024)  Stress: No Stress Concern Present (01/30/2024)  Tobacco Use: Low Risk  (01/30/2024)  Health Literacy: Adequate Health Literacy (01/30/2024)   See flowsheets for full screening details  Depression Screen PHQ 2 & 9 Depression Scale- Over the past 2 weeks, how often have you been bothered by any of the following problems? Little interest or pleasure in doing things: 0 Feeling down, depressed, or hopeless (PHQ Adolescent also includes...irritable): 3 PHQ-2 Total Score: 3 Trouble falling or staying asleep, or sleeping too much: 1 (gets about 5 1/2 -6hrs) Feeling tired or having little energy: 0 Poor appetite or overeating (PHQ Adolescent also includes...weight loss): 0 Feeling bad about yourself - or that you are a failure or have let yourself or your family down: 0 Trouble concentrating on things, such as reading the newspaper or watching television (PHQ Adolescent also includes...like school work): 0 Moving or speaking so slowly that other people could have noticed. Or  the opposite - being so fidgety or restless that you have been moving around a lot more than usual: 0 Thoughts that you would be better off dead, or of hurting yourself in some way: 0 PHQ-9 Total Score: 4 If you checked off any problems, how difficult have these problems made it for you to do your work, take care of things at home, or get along with other people?: Not difficult at all  Depression Treatment Depression Interventions/Treatment : EYV7-0 Score <4 Follow-up Not Indicated     Goals Addressed               This Visit's Progress     Patient Stated (pt-stated)        Patient stated he plans to continue to stay active       Visit info / Clinical Intake: Medicare Wellness Visit Type:: Subsequent Annual  Wellness Visit Persons participating in visit:: patient Medicare Wellness Visit Mode:: In-person (required for WTM) Information given by:: patient Interpreter Needed?: No Pre-visit prep was completed: yes AWV questionnaire completed by patient prior to visit?: yes Date:: 01/27/24 Living arrangements:: lives with spouse/significant other Patient's Overall Health Status Rating: good Typical amount of pain: none Does pain affect daily life?: no Are you currently prescribed opioids?: no  Dietary Habits and Nutritional Risks How many meals a day?: 3 Eats fruit and vegetables daily?: yes Most meals are obtained by: preparing own meals; eating out In the last 2 weeks, have you had any of the following?: none Diabetic:: no  Functional Status Activities of Daily Living (to include ambulation/medication): Independent Ambulation: Independent with device- listed below Home Assistive Devices/Equipment: Eyeglasses; Dentures (specify type) Medication Administration: Independent Home Management: Independent Manage your own finances?: yes Primary transportation is: driving Concerns about vision?: no *vision screening is required for WTM* Concerns about hearing?: no  Fall Screening Falls in the past year?: 0 (confirmed with pt) Number of falls in past year: 0 Was there an injury with Fall?: 0 Fall Risk Category Calculator: 0 Patient Fall Risk Level: Low Fall Risk  Fall Risk Patient at Risk for Falls Due to: No Fall Risks Fall risk Follow up: Falls evaluation completed; Falls prevention discussed  Home and Transportation Safety: All rugs have non-skid backing?: yes All stairs or steps have railings?: yes Grab bars in the bathtub or shower?: yes Have non-skid surface in bathtub or shower?: yes Good home lighting?: yes Regular seat belt use?: yes Hospital stays in the last year:: no  Cognitive Assessment Difficulty concentrating, remembering, or making decisions? : no Will 6CIT or  Mini Cog be Completed: yes What year is it?: 0 points What month is it?: 0 points Give patient an address phrase to remember (5 components): [address] [place], [place] About what time is it?: 0 points Count backwards from 20 to 1: 0 points Say the months of the year in reverse: 0 points Repeat the address phrase from earlier: 2 points (Drive) 6 CIT Score: 2 points  Advance Directives (For Healthcare) Does Patient Have a Medical Advance Directive?: Yes Type of Advance Directive: Healthcare Power of [place]; Living will Copy of Healthcare Power of Attorney in Chart?: No - copy requested Copy of Living Will in Chart?: No - copy requested  Reviewed/Updated  Reviewed/Updated: Reviewed All (Medical, Surgical, Family, Medications, Allergies, Care Teams, Patient Goals)        Objective:    Today's Vitals   01/30/24 1316  BP: 120/78  Pulse: 64  SpO2: 97%  Weight: 156 lb  9.6 oz (71 kg)  Height: 5' 8.5 (1.74 m)   Body mass index is 23.46 kg/m.  Current Medications (verified) Outpatient Encounter Medications as of 01/30/2024  Medication Sig   b complex vitamins tablet Take 1 tablet by mouth daily.   BABY ASPIRIN  PO Take by mouth.   Beet Root POWD Take by mouth 2 (two) times daily. Mixes 1 tbsp in 6 oz of water   Cholecalciferol (VITAMIN D) 2000 units tablet Take 2,000 Units by mouth daily.   Chromium-Cinnamon (CINNAMON PLUS CHROMIUM PO) Take by mouth.   Coenzyme Q10 (CO Q 10 PO) Take by mouth daily.   CREATINE PO Take 1 mg by mouth 1 day or 1 dose.   ezetimibe (ZETIA) 10 MG tablet Take 10 mg by mouth daily.   ferrous sulfate 220 (44 Fe) MG/5ML solution Take 220 mg by mouth daily.   fish oil-omega-3 fatty acids 1000 MG capsule Take 4,000 mg by mouth 2 (two) times daily.   Garlic 1000 MG CAPS Take by mouth.   GLUCOSAMINE SULFATE PO Take 2 tablets by mouth daily.   hydrochlorothiazide (HYDRODIURIL) 12.5 MG tablet Take 12.5 mg by mouth daily.   levothyroxine  (SYNTHROID ) 50 MCG  tablet Take 1 tablet by mouth once daily   Misc Natural Products (GINKOGIN PO) Take by mouth.   Multiple Vitamin (MULTIVITAMIN WITH MINERALS) TABS tablet Take 1 tablet by mouth daily.   PUMPKIN SEED PO Take by mouth.   Resveratrol 250 MG CAPS Take by mouth.   rosuvastatin (CRESTOR) 20 MG tablet Take 20 mg by mouth every other day.   tadalafil  (CIALIS ) 5 MG tablet Take 1 tablet (5 mg total) by mouth daily.   TURMERIC PO Take 1 tablet by mouth 2 (two) times daily. Less than a teaspoonful -takes with tomato paste and pepper   No facility-administered encounter medications on file as of 01/30/2024.   Hearing/Vision screen Hearing Screening - Comments:: Denies hearing difficulties   Vision Screening - Comments:: Wears rx glasses - up to date with routine eye exams with Lynwood Molt Immunizations and Health Maintenance Health Maintenance  Topic Date Due   COVID-19 Vaccine (7 - 2025-26 season) 11/07/2023   Medicare Annual Wellness (AWV)  01/29/2025   DTaP/Tdap/Td (3 - Td or Tdap) 08/06/2027   Colonoscopy  08/19/2029   Pneumococcal Vaccine: 50+ Years  Completed   Influenza Vaccine  Completed   Hepatitis C Screening  Completed   Zoster Vaccines- Shingrix  Completed   Meningococcal B Vaccine  Aged Out   Fecal DNA (Cologuard)  Discontinued        Assessment/Plan:  This is a routine wellness examination for Jamonta.  Patient Care Team: Plotnikov, Karlynn GAILS, MD as PCP - General Abran Norleen SAILOR, MD as Consulting Physician (Gastroenterology) Cesario Chamorro, MD as Referring Physician (Cardiology) Carolee Sherwood JONETTA DOUGLAS, MD as Consulting Physician (Urology) Molt Norleen, OD as Consulting Physician (Optometry)  I have personally reviewed and noted the following in the patient's chart:   Medical and social history Use of alcohol, tobacco or illicit drugs  Current medications and supplements including opioid prescriptions. Functional ability and status Nutritional status Physical  activity Advanced directives List of other physicians Hospitalizations, surgeries, and ER visits in previous 12 months Vitals Screenings to include cognitive, depression, and falls Referrals and appointments  Orders Placed This Encounter  Procedures   Cologuard    Mail to: [address] [place], [place] [address]   In addition, I have reviewed and discussed with patient certain  preventive protocols, quality metrics, and best practice recommendations. A written personalized care plan for preventive services as well as general preventive health recommendations were provided to patient.   Verdie CHRISTELLA Saba, CMA   01/30/2024   Return in 1 year (on 01/29/2025).  After Visit Summary: (In Person-Declined) Patient declined AVS at this time.  Nurse Notes: Ordered a Cologuard (pt stated had last 78yrs ago).  Scheduled 2026 AWV/CPE appts.    Medical screening examination/treatment/procedure(s) were performed by non-physician practitioner and as supervising physician I was immediately available for consultation/collaboration.  I agree with above. Karlynn Noel, MD

## 2024-01-30 NOTE — Patient Instructions (Addendum)
 Travis Morse,  Thank you for taking the time for your Medicare Wellness Visit. I appreciate your continued commitment to your health goals. Please review the care plan we discussed, and feel free to reach out if I can assist you further.  Please note that Annual Wellness Visits do not include a physical exam. Some assessments may be limited, especially if the visit was conducted virtually. If needed, we may recommend an in-person follow-up with your provider.  Ongoing Care Seeing your primary care provider every 3 to 6 months helps us  monitor your health and provide consistent, personalized care.   Referrals If a referral was made during today's visit and you haven't received any updates within two weeks, please contact the referred provider directly to check on the status.  Recommended Screenings:  Health Maintenance  Topic Date Due   COVID-19 Vaccine (7 - 2025-26 season) 11/07/2023   Medicare Annual Wellness Visit  01/29/2025   DTaP/Tdap/Td vaccine (3 - Td or Tdap) 08/06/2027   Colon Cancer Screening  08/19/2029   Pneumococcal Vaccine for age over 9  Completed   Flu Shot  Completed   Hepatitis C Screening  Completed   Zoster (Shingles) Vaccine  Completed   Meningitis B Vaccine  Aged Out   Cologuard (Stool DNA test)  Discontinued       01/30/2024    1:10 PM  Advanced Directives  Does Patient Have a Medical Advance Directive? Yes  Type of Estate Agent of [place];Living will  Copy of Healthcare Power of Attorney in Chart? No - copy requested    Vision: Annual vision screenings are recommended for early detection of glaucoma, cataracts, and diabetic retinopathy. These exams can also reveal signs of chronic conditions such as diabetes and high blood pressure.  Dental: Annual dental screenings help detect early signs of oral cancer, gum disease, and other conditions linked to overall health, including heart disease and diabetes.

## 2024-01-31 ENCOUNTER — Telehealth: Payer: Self-pay

## 2024-01-31 NOTE — Telephone Encounter (Signed)
 Pt called in today to provide update after speaking with his insurance regarding cologuard test. He states that he found out about a better test called Cologuard Plus and would like to do that,he did verify it is covered by insurance however it is not in [facility]'s system. He states that he would like to request the order be modified if possible using CPT 0464U to reflect this new test. He is asking for Lottie Saba to give him a call back to discuss further.

## 2024-01-31 NOTE — Telephone Encounter (Signed)
 Copied from CRM #[id number]. Topic: Clinical - Request for Lab/Test Order >> Jan 31, 2024 12:44 PM Travis Morse wrote: Reason for CRM: Patient is requesting a callback from Mrs. Rouse regarding his appointment he had on yesterday. Patient is wanting the cologuard Plus because it is more accurate. If this can be done he wants it mailed to [address] [place], [place] [address]. Please call patient with update.

## 2024-01-31 NOTE — Telephone Encounter (Unsigned)
 Copied from CRM #[id number]. Topic: General - Other >> Jan 31, 2024  3:20 PM Mercedes MATSU wrote: Reason for CRM: Patient called in requesting a call back, he said he had a missed call from Dr. Josetta nurse. And he can be reached at [phone].

## 2024-02-08 DIAGNOSIS — Z85820 Personal history of malignant melanoma of skin: Secondary | ICD-10-CM | POA: Diagnosis not present

## 2024-02-08 DIAGNOSIS — L245 Irritant contact dermatitis due to other chemical products: Secondary | ICD-10-CM | POA: Diagnosis not present

## 2024-02-08 DIAGNOSIS — L853 Xerosis cutis: Secondary | ICD-10-CM | POA: Diagnosis not present

## 2024-02-08 DIAGNOSIS — L821 Other seborrheic keratosis: Secondary | ICD-10-CM | POA: Diagnosis not present

## 2024-02-08 DIAGNOSIS — Z85828 Personal history of other malignant neoplasm of skin: Secondary | ICD-10-CM | POA: Diagnosis not present

## 2024-02-08 DIAGNOSIS — L570 Actinic keratosis: Secondary | ICD-10-CM | POA: Diagnosis not present

## 2024-02-08 DIAGNOSIS — L812 Freckles: Secondary | ICD-10-CM | POA: Diagnosis not present

## 2024-02-08 DIAGNOSIS — B353 Tinea pedis: Secondary | ICD-10-CM | POA: Diagnosis not present

## 2024-02-10 LAB — COLOGUARD: COLOGUARD: NEGATIVE

## 2024-02-27 ENCOUNTER — Other Ambulatory Visit

## 2024-03-13 ENCOUNTER — Ambulatory Visit
Admission: RE | Admit: 2024-03-13 | Discharge: 2024-03-13 | Disposition: A | Source: Ambulatory Visit | Attending: Emergency Medicine

## 2024-03-13 DIAGNOSIS — M5412 Radiculopathy, cervical region: Secondary | ICD-10-CM

## 2024-03-20 ENCOUNTER — Ambulatory Visit: Payer: Self-pay | Admitting: Emergency Medicine

## 2024-03-20 DIAGNOSIS — M5412 Radiculopathy, cervical region: Secondary | ICD-10-CM

## 2024-03-20 DIAGNOSIS — R937 Abnormal findings on diagnostic imaging of other parts of musculoskeletal system: Secondary | ICD-10-CM

## 2024-03-26 ENCOUNTER — Encounter: Payer: Self-pay | Admitting: Internal Medicine

## 2024-03-28 ENCOUNTER — Encounter: Payer: Self-pay | Admitting: Internal Medicine

## 2024-03-28 ENCOUNTER — Ambulatory Visit: Admitting: Internal Medicine

## 2024-03-28 VITALS — BP 118/72 | HR 57 | Temp 98.0°F | Ht 68.0 in | Wt 156.0 lb

## 2024-03-28 DIAGNOSIS — I4891 Unspecified atrial fibrillation: Secondary | ICD-10-CM | POA: Diagnosis not present

## 2024-03-28 DIAGNOSIS — M47812 Spondylosis without myelopathy or radiculopathy, cervical region: Secondary | ICD-10-CM | POA: Diagnosis not present

## 2024-03-28 DIAGNOSIS — I4892 Unspecified atrial flutter: Secondary | ICD-10-CM | POA: Diagnosis not present

## 2024-03-28 DIAGNOSIS — E034 Atrophy of thyroid (acquired): Secondary | ICD-10-CM

## 2024-03-28 DIAGNOSIS — Z0000 Encounter for general adult medical examination without abnormal findings: Secondary | ICD-10-CM | POA: Diagnosis not present

## 2024-03-28 DIAGNOSIS — E785 Hyperlipidemia, unspecified: Secondary | ICD-10-CM

## 2024-03-28 DIAGNOSIS — G8929 Other chronic pain: Secondary | ICD-10-CM

## 2024-03-28 DIAGNOSIS — N320 Bladder-neck obstruction: Secondary | ICD-10-CM

## 2024-03-28 LAB — CBC WITH DIFFERENTIAL/PLATELET
Basophils Absolute: 0.1 K/uL (ref 0.0–0.1)
Basophils Relative: 1.2 % (ref 0.0–3.0)
Eosinophils Absolute: 0.2 K/uL (ref 0.0–0.7)
Eosinophils Relative: 5 % (ref 0.0–5.0)
HCT: 40.8 % (ref 39.0–52.0)
Hemoglobin: 14.2 g/dL (ref 13.0–17.0)
Lymphocytes Relative: 22.5 % (ref 12.0–46.0)
Lymphs Abs: 1.1 K/uL (ref 0.7–4.0)
MCHC: 34.9 g/dL (ref 30.0–36.0)
MCV: 95 fl (ref 78.0–100.0)
Monocytes Absolute: 0.5 K/uL (ref 0.1–1.0)
Monocytes Relative: 9.7 % (ref 3.0–12.0)
Neutro Abs: 2.9 K/uL (ref 1.4–7.7)
Neutrophils Relative %: 61.6 % (ref 43.0–77.0)
Platelets: 205 K/uL (ref 150.0–400.0)
RBC: 4.29 Mil/uL (ref 4.22–5.81)
RDW: 12.4 % (ref 11.5–15.5)
WBC: 4.8 K/uL (ref 4.0–10.5)

## 2024-03-28 LAB — URINALYSIS
Bilirubin Urine: NEGATIVE
Hgb urine dipstick: NEGATIVE
Ketones, ur: NEGATIVE
Leukocytes,Ua: NEGATIVE
Nitrite: NEGATIVE
Specific Gravity, Urine: 1.015 (ref 1.000–1.030)
Total Protein, Urine: NEGATIVE
Urine Glucose: NEGATIVE
Urobilinogen, UA: 0.2 (ref 0.0–1.0)
pH: 6 (ref 5.0–8.0)

## 2024-03-28 LAB — COMPREHENSIVE METABOLIC PANEL WITH GFR
ALT: 29 U/L (ref 3–53)
AST: 39 U/L — ABNORMAL HIGH (ref 5–37)
Albumin: 4.5 g/dL (ref 3.5–5.2)
Alkaline Phosphatase: 56 U/L (ref 39–117)
BUN: 11 mg/dL (ref 6–23)
CO2: 31 meq/L (ref 19–32)
Calcium: 9.7 mg/dL (ref 8.4–10.5)
Chloride: 100 meq/L (ref 96–112)
Creatinine, Ser: 0.87 mg/dL (ref 0.40–1.50)
GFR: 85.17 mL/min
Glucose, Bld: 104 mg/dL — ABNORMAL HIGH (ref 70–99)
Potassium: 3.9 meq/L (ref 3.5–5.1)
Sodium: 138 meq/L (ref 135–145)
Total Bilirubin: 0.4 mg/dL (ref 0.2–1.2)
Total Protein: 7.4 g/dL (ref 6.0–8.3)

## 2024-03-28 LAB — LIPID PANEL
Cholesterol: 128 mg/dL (ref 28–200)
HDL: 71 mg/dL
LDL Cholesterol: 49 mg/dL (ref 10–99)
NonHDL: 56.81
Total CHOL/HDL Ratio: 2
Triglycerides: 40 mg/dL (ref 10.0–149.0)
VLDL: 8 mg/dL (ref 0.0–40.0)

## 2024-03-28 LAB — PSA: PSA: 1.64 ng/mL (ref 0.10–4.00)

## 2024-03-28 LAB — TSH: TSH: 3.5 u[IU]/mL (ref 0.35–5.50)

## 2024-03-28 NOTE — Progress Notes (Signed)
 "  Subjective:  Patient ID: Travis Morse, male    DOB: 05-Oct-1950  Age: 74 y.o. MRN: [id number]  CC: No chief complaint on file.   HPI Travis Morse presents for a well exam He continues to do some serious downhill skiing every winter He is complaining of neck pain, she had a cervical spine MRI done She is anticoagulated for A-fib   Outpatient Medications Prior to Visit  Medication Sig Dispense Refill   b complex vitamins tablet Take 1 tablet by mouth daily.     BABY ASPIRIN  PO Take by mouth.     Beet Root POWD Take by mouth 2 (two) times daily. Mixes 1 tbsp in 6 oz of water     Cholecalciferol (VITAMIN D) 2000 units tablet Take 2,000 Units by mouth daily.     Chromium-Cinnamon (CINNAMON PLUS CHROMIUM PO) Take by mouth.     Coenzyme Q10 (CO Q 10 PO) Take by mouth daily.     CREATINE PO Take 1 mg by mouth 1 day or 1 dose.     ezetimibe (ZETIA) 10 MG tablet Take 10 mg by mouth daily.     ferrous sulfate 220 (44 Fe) MG/5ML solution Take 220 mg by mouth daily.     fish oil-omega-3 fatty acids 1000 MG capsule Take 4,000 mg by mouth 2 (two) times daily.     Garlic 1000 MG CAPS Take by mouth.     GLUCOSAMINE SULFATE PO Take 2 tablets by mouth daily.     hydrochlorothiazide (HYDRODIURIL) 12.5 MG tablet Take 12.5 mg by mouth daily.     levothyroxine  (SYNTHROID ) 50 MCG tablet Take 1 tablet by mouth once daily 90 tablet 0   Misc Natural Products (GINKOGIN PO) Take by mouth.     Multiple Vitamin (MULTIVITAMIN WITH MINERALS) TABS tablet Take 1 tablet by mouth daily.     PUMPKIN SEED PO Take by mouth.     Resveratrol 250 MG CAPS Take by mouth.     rosuvastatin (CRESTOR) 20 MG tablet Take 20 mg by mouth every other day.     tadalafil  (CIALIS ) 5 MG tablet Take 1 tablet (5 mg total) by mouth daily. 90 tablet 3   TURMERIC PO Take 1 tablet by mouth 2 (two) times daily. Less than a teaspoonful -takes with tomato paste and pepper     No facility-administered medications prior to visit.     ROS: Review of Systems  Constitutional:  Negative for appetite change, fatigue and unexpected weight change.  HENT:  Negative for congestion, nosebleeds, sneezing, sore throat and trouble swallowing.   Eyes:  Negative for itching and visual disturbance.  Respiratory:  Negative for cough.   Cardiovascular:  Negative for chest pain, palpitations and leg swelling.  Gastrointestinal:  Negative for abdominal distention, blood in stool, diarrhea and nausea.  Genitourinary:  Negative for frequency and hematuria.  Musculoskeletal:  Negative for back pain, gait problem, joint swelling and neck pain.  Skin:  Negative for rash.  Neurological:  Negative for dizziness, tremors, speech difficulty and weakness.  Psychiatric/Behavioral:  Negative for agitation, dysphoric mood, sleep disturbance and suicidal ideas. The patient is not nervous/anxious.     Objective:  BP 118/72   Pulse (!) 57   Temp 98 F (36.7 C) (Oral)   Ht 5' 8 (1.727 m)   Wt 156 lb (70.8 kg)   SpO2 99%   BMI 23.72 kg/m   BP Readings from Last 3 Encounters:  03/28/24 118/72  01/30/24 120/78  09/26/23 108/70    Wt Readings from Last 3 Encounters:  03/28/24 156 lb (70.8 kg)  01/30/24 156 lb 9.6 oz (71 kg)  09/26/23 154 lb (69.9 kg)    Physical Exam Constitutional:      General: He is not in acute distress.    Appearance: He is well-developed.     Comments: NAD  Eyes:     Conjunctiva/sclera: Conjunctivae normal.     Pupils: Pupils are equal, round, and reactive to light.  Neck:     Thyroid : No thyromegaly.     Vascular: No JVD.  Cardiovascular:     Rate and Rhythm: Normal rate and regular rhythm.     Heart sounds: Normal heart sounds. No murmur heard.    No friction rub. No gallop.  Pulmonary:     Effort: Pulmonary effort is normal. No respiratory distress.     Breath sounds: Normal breath sounds. No wheezing or rales.  Chest:     Chest wall: No tenderness.  Abdominal:     General: Bowel sounds are  normal. There is no distension.     Palpations: Abdomen is soft. There is no mass.     Tenderness: There is no abdominal tenderness. There is no guarding or rebound.  Musculoskeletal:        General: No tenderness. Normal range of motion.     Cervical back: Normal range of motion.  Lymphadenopathy:     Cervical: No cervical adenopathy.  Skin:    General: Skin is warm and dry.     Findings: No rash.  Neurological:     Mental Status: He is alert and oriented to person, place, and time.     Cranial Nerves: No cranial nerve deficit.     Motor: No abnormal muscle tone.     Coordination: Coordination normal.     Gait: Gait normal.     Deep Tendon Reflexes: Reflexes are normal and symmetric.  Psychiatric:        Behavior: Behavior normal.        Thought Content: Thought content normal.        Judgment: Judgment normal.   Ankle slightly sensitive to range of motion  Lab Results  Component Value Date   WBC 4.8 03/28/2024   HGB 14.2 03/28/2024   HCT 40.8 03/28/2024   PLT 205.0 03/28/2024   GLUCOSE 104 (H) 03/28/2024   CHOL 128 03/28/2024   TRIG 40.0 03/28/2024   HDL 71.00 03/28/2024   LDLDIRECT 117.3 01/21/2011   LDLCALC 49 03/28/2024   ALT 29 03/28/2024   AST 39 (H) 03/28/2024   NA 138 03/28/2024   K 3.9 03/28/2024   CL 100 03/28/2024   CREATININE 0.87 03/28/2024   BUN 11 03/28/2024   CO2 31 03/28/2024   TSH 3.50 03/28/2024   PSA 1.64 03/28/2024   HGBA1C 5.6 10/17/2017    MR Cervical Spine Wo Contrast Result Date: 03/20/2024 CLINICAL DATA:  Initial evaluation for cervical radiculopathy, left arm pain and numbness. EXAM: MRI CERVICAL SPINE WITHOUT CONTRAST TECHNIQUE: Multiplanar, multisequence MR imaging of the cervical spine was performed. No intravenous contrast was administered. COMPARISON:  None Available. FINDINGS: Alignment: Straightening with mild reversal of the normal cervical lordosis. 2 mm anterolisthesis of C3 on C4. Trace degenerative retrolisthesis of C4 on C5.  Vertebrae: Vertebral body height maintained without acute or chronic fracture. Bone marrow signal intensity within normal limits. No worrisome osseous lesions. No abnormal marrow edema. Cord: Normal signal and morphology. Posterior Fossa, vertebral arteries, paraspinal  tissues: Unremarkable. Disc levels: C2-C3: Minimal disc bulge. Mild left-sided facet arthrosis. No spinal stenosis. Moderate left C3 foraminal narrowing. Right neural foramen remains patent. C3-C4: 2 mm anterolisthesis with mild disc bulge and uncovertebral spurring. Mild right greater than left facet hypertrophy. Resultant mild spinal stenosis. Moderate left worse than right C4 foraminal narrowing. C4-C5: Degenerative vertebral disc space narrowing with diffuse disc osteophyte complex. Flattening and partial effacement of the ventral thecal sac. No more than mild spinal stenosis. Moderate right with mild left C5 foraminal narrowing. C5-C6: Degenerative vertebral disc space narrowing with diffuse disc osteophyte complex, asymmetric to the left. Posterior component flattens and mildly indents the ventral thecal sac, worse on the left. Mild flattening of the left hemi cord without cord signal changes. Resultant mild spinal stenosis. Severe left with moderate right C6 foraminal narrowing. C6-C7: Degenerate intervertebral disc space narrowing with diffuse disc osteophyte complex. Flattening of the ventral thecal sac with no more than mild spinal stenosis. Moderate bilateral C7 foraminal narrowing. C7-T1: Minimal disc bulge. Mild bilateral facet and ligament flavum hypertrophy. No significant spinal stenosis. Moderate right worse than left C8 foraminal narrowing. T1-2: Mild disc bulge.  No spinal stenosis.  Foramina appear patent. IMPRESSION: 1. Multilevel cervical spondylosis with resultant mild diffuse spinal stenosis at C3-4 through C6-7. 2. Multifactorial degenerative changes with resultant multilevel foraminal narrowing as above. Notable findings  include moderate left C3 and bilateral C4 foraminal stenosis, moderate right C5 foraminal narrowing, severe left with moderate right C6 foraminal stenosis, with moderate bilateral C7 and C8 foraminal narrowing. Electronically Signed   By: Morene Hoard M.D.   On: 03/20/2024 06:22    Assessment & Plan:   Problem List Items Addressed This Visit     Hypothyroidism   Cont on Levothroid Chronic      Relevant Orders   TSH (Completed)   Urinalysis (Completed)   CBC with Differential/Platelet (Completed)   Lipid panel (Completed)   Comprehensive metabolic panel with GFR (Completed)   PSA (Completed)   Dyslipidemia    Cont on Crestor and Zetia      Relevant Orders   TSH (Completed)   Urinalysis (Completed)   CBC with Differential/Platelet (Completed)   Lipid panel (Completed)   Comprehensive metabolic panel with GFR (Completed)   PSA (Completed)   Atrial fib/flutter, transient (HCC) - Primary   Relevant Orders   TSH (Completed)   Urinalysis (Completed)   CBC with Differential/Platelet (Completed)   Lipid panel (Completed)   Comprehensive metabolic panel with GFR (Completed)   PSA (Completed)   Other Visit Diagnoses       Bladder neck obstruction       Relevant Orders   PSA (Completed)         No orders of the defined types were placed in this encounter.     Follow-up: Return in about 6 months (around 09/25/2024) for a follow-up visit.  Marolyn Noel, MD "

## 2024-03-28 NOTE — Assessment & Plan Note (Signed)
 Cont on Levothroid Chronic

## 2024-03-28 NOTE — Assessment & Plan Note (Signed)
Cont on Crestor and Zetia 

## 2024-03-30 ENCOUNTER — Ambulatory Visit: Payer: Self-pay | Admitting: Internal Medicine

## 2024-04-01 DIAGNOSIS — G8929 Other chronic pain: Secondary | ICD-10-CM | POA: Insufficient documentation

## 2024-04-01 NOTE — Assessment & Plan Note (Signed)
 Extensive multilevel cervical spondylosis with some diffuse spinal stenosis and foraminal stenosis on the MRI from January 2026.  I wonder if he should use some sort of a neck brace for downhill skiing. He continues to do some serious downhill skiing every winter

## 2024-04-01 NOTE — Assessment & Plan Note (Signed)
 No relapse.  He tells me that he is anticoagulated with Eliquis, however I cannot see this medication on the list.  He has a Fitbit that can detect A-fib. He continues to do some serious downhill skiing every winter.  I wonder if you should skip Eliquis on the days of his skin if she is taking it.  We will need to find out

## 2024-04-01 NOTE — Assessment & Plan Note (Signed)
  We discussed age appropriate health related issues, including available/recomended screening tests and vaccinations. Labs were ordered to be later reviewed . All questions were answered. We discussed one or more of the following - seat belt use, use of sunscreen/sun exposure exercise, fall risk reduction, second hand smoke exposure, firearm use and storage, seat belt use, a need for adhering to healthy diet and exercise. Labs were ordered.  All questions were answered. Coloduard 2018 (-)  Colon 2021 due in 2026 Dr Henrene Pastor

## 2024-04-11 ENCOUNTER — Other Ambulatory Visit: Payer: Self-pay | Admitting: Internal Medicine

## 2024-05-14 ENCOUNTER — Ambulatory Visit: Admitting: Orthopedic Surgery

## 2024-09-26 ENCOUNTER — Ambulatory Visit: Admitting: Internal Medicine

## 2025-01-30 ENCOUNTER — Ambulatory Visit
